# Patient Record
Sex: Female | Born: 1955 | Race: White | Hispanic: No | Marital: Married | Smoking: Never smoker
Health system: Southern US, Community
[De-identification: ages and names within clinical notes are randomized; demographics above are authoritative.]

## PROBLEM LIST (undated history)

## (undated) DIAGNOSIS — Z8739 Personal history of other diseases of the musculoskeletal system and connective tissue: Secondary | ICD-10-CM

## (undated) DIAGNOSIS — Z9221 Personal history of antineoplastic chemotherapy: Secondary | ICD-10-CM

## (undated) DIAGNOSIS — R51 Headache: Secondary | ICD-10-CM

## (undated) DIAGNOSIS — R519 Headache, unspecified: Secondary | ICD-10-CM

## (undated) DIAGNOSIS — F329 Major depressive disorder, single episode, unspecified: Secondary | ICD-10-CM

## (undated) DIAGNOSIS — J45909 Unspecified asthma, uncomplicated: Secondary | ICD-10-CM

## (undated) DIAGNOSIS — I824Z9 Acute embolism and thrombosis of unspecified deep veins of unspecified distal lower extremity: Secondary | ICD-10-CM

## (undated) DIAGNOSIS — I313 Pericardial effusion (noninflammatory): Secondary | ICD-10-CM

## (undated) DIAGNOSIS — M1990 Unspecified osteoarthritis, unspecified site: Secondary | ICD-10-CM

## (undated) DIAGNOSIS — D649 Anemia, unspecified: Secondary | ICD-10-CM

## (undated) DIAGNOSIS — F32A Depression, unspecified: Secondary | ICD-10-CM

## (undated) DIAGNOSIS — E039 Hypothyroidism, unspecified: Secondary | ICD-10-CM

## (undated) DIAGNOSIS — F419 Anxiety disorder, unspecified: Secondary | ICD-10-CM

## (undated) DIAGNOSIS — K5790 Diverticulosis of intestine, part unspecified, without perforation or abscess without bleeding: Secondary | ICD-10-CM

## (undated) DIAGNOSIS — J90 Pleural effusion, not elsewhere classified: Secondary | ICD-10-CM

## (undated) DIAGNOSIS — M7072 Other bursitis of hip, left hip: Secondary | ICD-10-CM

## (undated) DIAGNOSIS — C50919 Malignant neoplasm of unspecified site of unspecified female breast: Secondary | ICD-10-CM

## (undated) DIAGNOSIS — E785 Hyperlipidemia, unspecified: Secondary | ICD-10-CM

## (undated) DIAGNOSIS — Z923 Personal history of irradiation: Secondary | ICD-10-CM

## (undated) HISTORY — PX: BREAST BIOPSY: SHX20

## (undated) HISTORY — PX: BREAST SURGERY: SHX581

## (undated) HISTORY — PX: TONSILLECTOMY: SUR1361

## (undated) HISTORY — DX: Pericardial effusion (noninflammatory): I31.3

## (undated) HISTORY — PX: ABDOMINAL HYSTERECTOMY: SHX81

---
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
## 2004-09-04 ENCOUNTER — Ambulatory Visit: Payer: Self-pay | Admitting: Surgery

## 2005-06-18 ENCOUNTER — Ambulatory Visit: Payer: Self-pay

## 2006-01-09 ENCOUNTER — Ambulatory Visit: Payer: Self-pay | Admitting: Internal Medicine

## 2006-08-19 ENCOUNTER — Ambulatory Visit: Payer: Self-pay | Admitting: Gastroenterology

## 2007-08-18 ENCOUNTER — Ambulatory Visit: Payer: Self-pay | Admitting: Internal Medicine

## 2007-08-20 ENCOUNTER — Ambulatory Visit: Payer: Self-pay | Admitting: Internal Medicine

## 2008-09-01 ENCOUNTER — Ambulatory Visit: Payer: Self-pay | Admitting: Internal Medicine

## 2009-09-09 ENCOUNTER — Ambulatory Visit: Payer: Self-pay | Admitting: Internal Medicine

## 2010-01-31 ENCOUNTER — Emergency Department: Payer: Self-pay | Admitting: Internal Medicine

## 2010-08-13 DIAGNOSIS — C50919 Malignant neoplasm of unspecified site of unspecified female breast: Secondary | ICD-10-CM

## 2010-08-13 HISTORY — PX: BREAST BIOPSY: SHX20

## 2010-08-13 HISTORY — DX: Malignant neoplasm of unspecified site of unspecified female breast: C50.919

## 2010-09-13 ENCOUNTER — Ambulatory Visit: Payer: Self-pay | Admitting: Internal Medicine

## 2010-09-25 ENCOUNTER — Ambulatory Visit: Payer: Self-pay | Admitting: Surgery

## 2010-09-27 ENCOUNTER — Ambulatory Visit: Payer: Self-pay | Admitting: Oncology

## 2010-10-03 ENCOUNTER — Ambulatory Visit: Payer: Self-pay | Admitting: Oncology

## 2010-10-03 LAB — PATHOLOGY REPORT

## 2010-10-12 ENCOUNTER — Ambulatory Visit: Payer: Self-pay | Admitting: Oncology

## 2010-10-16 ENCOUNTER — Ambulatory Visit: Payer: Self-pay | Admitting: Surgery

## 2010-11-12 ENCOUNTER — Ambulatory Visit: Payer: Self-pay | Admitting: Oncology

## 2010-12-12 ENCOUNTER — Ambulatory Visit: Payer: Self-pay | Admitting: Oncology

## 2011-01-12 ENCOUNTER — Ambulatory Visit: Payer: Self-pay | Admitting: Oncology

## 2011-02-11 ENCOUNTER — Ambulatory Visit: Payer: Self-pay | Admitting: Oncology

## 2011-02-19 ENCOUNTER — Other Ambulatory Visit: Payer: Self-pay | Admitting: Surgery

## 2011-02-27 ENCOUNTER — Inpatient Hospital Stay: Payer: Self-pay | Admitting: Surgery

## 2011-02-27 HISTORY — PX: MASTECTOMY: SHX3

## 2011-03-14 ENCOUNTER — Ambulatory Visit: Payer: Self-pay | Admitting: Oncology

## 2011-04-14 ENCOUNTER — Ambulatory Visit: Payer: Self-pay | Admitting: Oncology

## 2011-05-14 ENCOUNTER — Ambulatory Visit: Payer: Self-pay | Admitting: Oncology

## 2011-06-14 ENCOUNTER — Ambulatory Visit: Payer: Self-pay | Admitting: Oncology

## 2011-07-02 ENCOUNTER — Ambulatory Visit: Payer: Self-pay | Admitting: Anesthesiology

## 2011-07-03 ENCOUNTER — Ambulatory Visit: Payer: Self-pay

## 2011-07-14 ENCOUNTER — Ambulatory Visit: Payer: Self-pay | Admitting: Oncology

## 2011-07-14 HISTORY — PX: PLACEMENT OF BREAST IMPLANTS: SHX6334

## 2011-08-08 ENCOUNTER — Ambulatory Visit: Payer: Self-pay

## 2011-08-14 ENCOUNTER — Ambulatory Visit: Payer: Self-pay | Admitting: Oncology

## 2011-09-14 ENCOUNTER — Ambulatory Visit: Payer: Self-pay | Admitting: Oncology

## 2011-10-12 ENCOUNTER — Ambulatory Visit: Payer: Self-pay | Admitting: Oncology

## 2011-10-19 ENCOUNTER — Other Ambulatory Visit: Payer: Self-pay | Admitting: Oncology

## 2011-10-19 DIAGNOSIS — Z853 Personal history of malignant neoplasm of breast: Secondary | ICD-10-CM

## 2011-10-19 DIAGNOSIS — Z9013 Acquired absence of bilateral breasts and nipples: Secondary | ICD-10-CM

## 2011-10-25 ENCOUNTER — Ambulatory Visit
Admission: RE | Admit: 2011-10-25 | Discharge: 2011-10-25 | Disposition: A | Discharge status: Home / Self Care | Payer: 59 | Source: Ambulatory Visit | Attending: Oncology | Admitting: Oncology

## 2011-10-25 DIAGNOSIS — Z853 Personal history of malignant neoplasm of breast: Secondary | ICD-10-CM

## 2011-10-25 DIAGNOSIS — Z9013 Acquired absence of bilateral breasts and nipples: Secondary | ICD-10-CM

## 2011-10-25 MED ORDER — GADOBENATE DIMEGLUMINE 529 MG/ML IV SOLN
18.0000 mL | Freq: Once | INTRAVENOUS | Status: AC | PRN
  Administered 2011-10-25: 18 mL via INTRAVENOUS

## 2011-11-12 ENCOUNTER — Ambulatory Visit: Payer: Self-pay | Admitting: Oncology

## 2011-12-12 ENCOUNTER — Ambulatory Visit: Payer: Self-pay | Admitting: Oncology

## 2011-12-28 ENCOUNTER — Ambulatory Visit: Payer: Self-pay | Admitting: Gastroenterology

## 2012-01-12 ENCOUNTER — Ambulatory Visit: Payer: Self-pay

## 2012-01-12 ENCOUNTER — Ambulatory Visit: Payer: Self-pay | Admitting: Oncology

## 2012-02-13 ENCOUNTER — Ambulatory Visit: Payer: Self-pay | Admitting: Oncology

## 2012-03-13 ENCOUNTER — Ambulatory Visit: Payer: Self-pay | Admitting: Oncology

## 2012-04-13 ENCOUNTER — Ambulatory Visit: Payer: Self-pay | Admitting: Oncology

## 2012-05-13 ENCOUNTER — Ambulatory Visit: Payer: Self-pay | Admitting: Oncology

## 2012-06-17 ENCOUNTER — Ambulatory Visit: Payer: Self-pay | Admitting: Oncology

## 2012-07-13 ENCOUNTER — Ambulatory Visit: Payer: Self-pay | Admitting: Oncology

## 2012-08-13 ENCOUNTER — Ambulatory Visit: Payer: Self-pay | Admitting: Oncology

## 2012-09-13 ENCOUNTER — Ambulatory Visit: Payer: Self-pay | Admitting: Oncology

## 2012-10-20 ENCOUNTER — Ambulatory Visit: Payer: Self-pay | Admitting: Oncology

## 2013-01-01 ENCOUNTER — Ambulatory Visit: Payer: Self-pay | Admitting: Oncology

## 2013-01-20 ENCOUNTER — Ambulatory Visit: Payer: Self-pay | Admitting: Oncology

## 2013-02-10 ENCOUNTER — Ambulatory Visit: Payer: Self-pay | Admitting: Oncology

## 2013-03-13 ENCOUNTER — Ambulatory Visit: Payer: Self-pay | Admitting: Oncology

## 2013-04-23 ENCOUNTER — Other Ambulatory Visit (HOSPITAL_COMMUNITY): Payer: Self-pay | Admitting: Oncology

## 2013-04-23 DIAGNOSIS — C50919 Malignant neoplasm of unspecified site of unspecified female breast: Secondary | ICD-10-CM

## 2013-04-23 DIAGNOSIS — Z9013 Acquired absence of bilateral breasts and nipples: Secondary | ICD-10-CM

## 2013-05-01 ENCOUNTER — Ambulatory Visit
Admission: RE | Admit: 2013-05-01 | Discharge: 2013-05-01 | Disposition: A | Discharge status: Home / Self Care | Payer: 59 | Source: Ambulatory Visit | Attending: Oncology | Admitting: Oncology

## 2013-05-01 DIAGNOSIS — C50919 Malignant neoplasm of unspecified site of unspecified female breast: Secondary | ICD-10-CM

## 2013-05-01 DIAGNOSIS — Z9013 Acquired absence of bilateral breasts and nipples: Secondary | ICD-10-CM

## 2013-05-01 MED ORDER — GADOBENATE DIMEGLUMINE 529 MG/ML IV SOLN
20.0000 mL | Freq: Once | INTRAVENOUS | Status: AC | PRN
  Administered 2013-05-01: 20 mL via INTRAVENOUS

## 2013-06-30 ENCOUNTER — Ambulatory Visit: Payer: Self-pay | Admitting: Unknown Physician Specialty

## 2013-07-23 ENCOUNTER — Ambulatory Visit: Payer: Self-pay | Admitting: Anesthesiology

## 2013-07-24 ENCOUNTER — Ambulatory Visit: Payer: Self-pay | Admitting: Oncology

## 2013-08-13 ENCOUNTER — Ambulatory Visit: Payer: Self-pay | Admitting: Oncology

## 2013-08-18 ENCOUNTER — Ambulatory Visit: Payer: Self-pay | Admitting: Anesthesiology

## 2014-01-29 ENCOUNTER — Ambulatory Visit: Payer: Self-pay | Admitting: Oncology

## 2014-02-10 ENCOUNTER — Ambulatory Visit: Payer: Self-pay | Admitting: Oncology

## 2014-03-17 ENCOUNTER — Ambulatory Visit: Payer: Self-pay | Admitting: Oncology

## 2014-03-30 ENCOUNTER — Other Ambulatory Visit: Payer: Self-pay | Admitting: Oncology

## 2014-03-30 DIAGNOSIS — Z853 Personal history of malignant neoplasm of breast: Secondary | ICD-10-CM

## 2014-05-03 ENCOUNTER — Ambulatory Visit
Admission: RE | Admit: 2014-05-03 | Discharge: 2014-05-03 | Disposition: A | Discharge status: Home / Self Care | Payer: 59 | Source: Ambulatory Visit | Attending: Oncology | Admitting: Oncology

## 2014-05-03 DIAGNOSIS — Z853 Personal history of malignant neoplasm of breast: Secondary | ICD-10-CM

## 2014-05-03 MED ORDER — GADOBENATE DIMEGLUMINE 529 MG/ML IV SOLN
20.0000 mL | Freq: Once | INTRAVENOUS | Status: AC | PRN
  Administered 2014-05-03: 20 mL via INTRAVENOUS

## 2014-07-29 ENCOUNTER — Ambulatory Visit: Payer: Self-pay | Admitting: Oncology

## 2014-08-13 ENCOUNTER — Ambulatory Visit: Payer: Self-pay | Admitting: Oncology

## 2014-12-03 NOTE — H&P (Signed)
PATIENT NAME:  Julie, Irwin MR#:  [id number] DATE OF BIRTH:  08-30-1955  DATE OF ADMISSION:  07/23/2013  CHIEF COMPLAINT: Low back pain and bilateral hip pain.   PROCEDURE: L4-L5 epidural steroid under fluoroscopic guidance with moderate sedation.   HISTORY OF PRESENT ILLNESS:  Julie Irwin presents for a new patient evaluation. She is a pleasant 59 year old white female with long-standing history of low back pain. This has basically been present for many years and has gradually gotten worse over the past several months to the point she sought medical attention. She has had previous hip injections for her hip pain, but the low back pain has been persistent, and Dr. Jefm Bryant has referred her secondary to the hip pain as a possible source of low back pain. She experiences low back pain that radiates into the left lateral hip and buttocks, as well as into the right side. It is worse on the left. She has had 3 previous bursa injections to the left hip and describes as a VAS of 9, best of 3, average is a 7; worse in the afternoon and worse after kneeling, sitting, standing, walking. Sitting at times helps, as does warm showers and baths. A previous MRI is reviewed today that shows, as dated 06/30/2013, evidence of mild bulges at L3-L4, L4-L5 and L5-S1. There is facet arthropathy, namely at L4-L5 and L5-S1, which is bilateral, and at L3-L4, she has symmetric lateral recess stenosis on the left with mild facet arthropathy and possible left L3-L4 nerve root impingement.   PAST MEDICAL HISTORY: Significant for asthma, scoliosis, anemia when treated for cancer, thyroid disease.   She is married with 1 child. Quit smoking 18 years ago and works as a Chiropractor at [employer] HISTORY: Significant for diabetes, high blood pressure and sudden stroke in her mother.   MEDICATIONS:  Compazine, fish oil, Ventolin, vitamin D3, Effexor 75, levothyroxine 125 , letrozole, topiramate, alendronate, aspirin 81  mg a day, Toprol, fluticasone, Flexeril 10 mg 1/2 tablet orally 3 times daily and Zofran.   PAST MEDICAL HISTORY: Also significant for hyperlipidemia, arthritis, fibrocystic disease, hypothyroidism, with history of breast cancer, right side; anxiety, depression, migraines, asthma.   Surgeries include anal sphincterotomy, tonsillectomy and adenoidectomy, a skin mass excision, right breast biopsy in the past and partial hysterectomy.   PHYSICAL EXAM:  GENERAL:  Reveals a pleasant white female in no acute distress. She is alert and oriented x 3, cooperative and compliant.  VITAL SIGNS: VAS is 6/10. Temperature 98.3 with a blood pressure 138/69, pulse is 78 and regular respirations.  CARDIOVASCULAR: Regular rate and rhythm.  EXTREMITIES:  Evaluation of lower extremity strength and function reveals a slightly antalgic gait. She goes from seated to standing without difficulty. She has a positive straight leg raise on the left side, negative on the right. Strength and muscle tone and bulk appears good.   ASSESSMENT: 1.  Degenerative disk disease, most notably at L3-L4 with L3 and L4 foraminal impingement on the left side consistent with symptoms.  2.  Facet arthropathy.  3.  Myofascial low back pain.   PLAN: 1.  We will proceed with an epidural steroid injection today, as requested per the patient. I have gone over the risks and benefits of the procedure with her in full detail. All questions are answered. No guarantees are made.  2.  Continue with stretching and strengthening exercises, as well as efforts at weight loss with return to clinic in 1 month.  PROCEDURE: The patient was taken to the fluoroscopy suite and placed in the prone position with IV in place and 2 mg of Versed were titrated for moderate sedation. Her back was prepped with Betadine x 3. We identified the area overlying the L4-L5 interspace; 1% lidocaine was infiltrated subQ into the fascia at this level. Using strict aseptic  technique and an intralaminar approach, I then advanced an 18-gauge Tuohy needle to a depth of approximately 8 cm, negative aspiration for heme or CSF. I injected 2 mL of Isovue yielding good epidural spread, approximately 2 levels cephalad, 1 level caudad, without evidence of IV or subarachnoid uptake. I then injected 5 mL normal saline, 1 mL ropivacaine 0.2% and 40 mg of triamcinolone without evidence of IV or subarachnoid symptoms. Then she was convalesced, discharged to home in stable condition. Follow up as mentioned.     ____________________________ Alvina Filbert Andree Elk, MD jga:dmm D: 07/23/2013 10:55:34 ET T: 07/23/2013 11:06:19 ET JOB#: [id number]  cc: Alvina Filbert. Andree Elk, MD, <Dictator> Kathrene Alu., MD Alvina Filbert Camey Edell MD ELECTRONICALLY SIGNED 07/27/2013 8:01

## 2015-01-04 DIAGNOSIS — M5416 Radiculopathy, lumbar region: Secondary | ICD-10-CM | POA: Insufficient documentation

## 2015-01-19 ENCOUNTER — Encounter: Payer: Self-pay | Admitting: Oncology

## 2015-01-26 ENCOUNTER — Inpatient Hospital Stay: Payer: 59 | Attending: Oncology | Admitting: Oncology

## 2015-01-26 ENCOUNTER — Encounter (INDEPENDENT_AMBULATORY_CARE_PROVIDER_SITE_OTHER): Payer: Self-pay

## 2015-01-26 VITALS — BP 113/67 | HR 73 | Temp 98.1°F | Resp 18 | Wt 226.2 lb

## 2015-01-26 DIAGNOSIS — Z7982 Long term (current) use of aspirin: Secondary | ICD-10-CM | POA: Insufficient documentation

## 2015-01-26 DIAGNOSIS — Z170 Estrogen receptor positive status [ER+]: Secondary | ICD-10-CM | POA: Diagnosis not present

## 2015-01-26 DIAGNOSIS — Z79899 Other long term (current) drug therapy: Secondary | ICD-10-CM | POA: Diagnosis not present

## 2015-01-26 DIAGNOSIS — C50911 Malignant neoplasm of unspecified site of right female breast: Secondary | ICD-10-CM | POA: Diagnosis not present

## 2015-01-26 DIAGNOSIS — Z79811 Long term (current) use of aromatase inhibitors: Secondary | ICD-10-CM | POA: Insufficient documentation

## 2015-01-26 DIAGNOSIS — M818 Other osteoporosis without current pathological fracture: Secondary | ICD-10-CM | POA: Diagnosis not present

## 2015-01-26 NOTE — Progress Notes (Signed)
Last week patient was having right breast pain.

## 2015-02-14 DIAGNOSIS — C50411 Malignant neoplasm of upper-outer quadrant of right female breast: Secondary | ICD-10-CM | POA: Insufficient documentation

## 2015-02-14 NOTE — Progress Notes (Signed)
[place]  [phone] Fax:(336) [phone]  ID: [name] OB: [birth year]  MR#: [id number]  PNT#:[id number]  Patient Care Team: Adin Hector, MD as PCP - General (Internal Medicine)  CHIEF COMPLAINT:  Chief Complaint  Patient presents with  . Follow-up    breast cancer    INTERVAL HISTORY: Patient returns to clinic today for routine six-month evaluation.  She currently feels well and is asymptomatic. She is tolerating letrozole and alendronate well without significant side effects. She has no neurologic complaints.  She has a good appetite and has maintained her weight.  She has no chest pain, shortness breath, cough, or hemoptysis.  She has no nausea, vomiting, constipation, or diarrhea.  Patient offers no specific complaints today.   REVIEW OF SYSTEMS:   Review of Systems  Constitutional: Negative.   Respiratory: Negative.   Cardiovascular: Negative.   Musculoskeletal: Negative.     As per HPI. Otherwise, a complete review of systems is negatve.  PAST MEDICAL HISTORY: No past medical history on file.  PAST SURGICAL HISTORY: No past surgical history on file.  FAMILY HISTORY No family history on file.     ADVANCED DIRECTIVES:    HEALTH MAINTENANCE: History  Substance Use Topics  . Smoking status: Not on file  . Smokeless tobacco: Not on file  . Alcohol Use: Not on file     Colonoscopy:  PAP:  Bone density:  Lipid panel:  Not on File  Current Outpatient Prescriptions  Medication Sig Dispense Refill  . alendronate (FOSAMAX) 70 MG tablet     . aspirin EC 81 MG tablet Take by mouth.    Marland Kitchen azelastine (ASTELIN) 0.1 % nasal spray instill 1 spray into each nostril twice a day for 7 days then once daily if needed  0  . benzonatate (TESSALON) 200 MG capsule take 1 capsule by mouth three times a day if needed for cough  0  . Cholecalciferol (VITAMIN D3) 2000 UNITS capsule Take by mouth.    . cyclobenzaprine (FLEXERIL) 10 MG  tablet Take by mouth.    . DULoxetine (CYMBALTA) 60 MG capsule     . letrozole (FEMARA) 2.5 MG tablet     . levothyroxine (SYNTHROID, LEVOTHROID) 137 MCG tablet     . metoprolol succinate (TOPROL-XL) 25 MG 24 hr tablet     . Omega-3 Fatty Acids (FISH OIL CONCENTRATE) 300 MG CAPS Take by mouth.    . prochlorperazine (COMPAZINE) 10 MG tablet     . QVAR 40 MCG/ACT inhaler     . topiramate (TOPAMAX) 50 MG tablet      No current facility-administered medications for this visit.    OBJECTIVE: Filed Vitals:   01/26/15 1617  BP: 113/67  Pulse: 73  Temp: 98.1 F (36.7 C)  Resp: 18     There is no height on file to calculate BMI.    ECOG FS:0 - Asymptomatic  General: Well-developed, well-nourished, no acute distress. Eyes: anicteric sclera. Breasts: Patient has bilateral reconstruction, no evidence of recurrence. Lungs: Clear to auscultation bilaterally. Heart: Regular rate and rhythm. No rubs, murmurs, or gallops. Abdomen: Soft, nontender, nondistended. No organomegaly noted, normoactive bowel sounds. Musculoskeletal: No edema, cyanosis, or clubbing. Neuro: Alert, answering all questions appropriately. Cranial nerves grossly intact. Skin: No rashes or petechiae noted. Psych: Normal affect.   LAB RESULTS:  No results found for: NA, K, CL, CO2, GLUCOSE, BUN, CREATININE, CALCIUM, PROT, ALBUMIN, AST, ALT, ALKPHOS, BILITOT, GFRNONAA, GFRAA  No results found for:  WBC, NEUTROABS, HGB, HCT, MCV, PLT   STUDIES: No results found.  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   PLAN:    1.  Breast cancer: No evidence of disease.  Patient's CA-27-29 is 21.7.  Patient had a recent breast MRI that was reported as negative.  Her post treatment MUGA revealed an EF of 61%. Continue letrozole completing in August 2017.  Return to clinic in 6 months for routine evaluation.  2.  Osteoporosis: Patient's most recent T score in August  2015 was reported as -2.6 which is unchanged from previous.  Continue alendronate, calcium, and vitamin D.  Repeat in August 2016.   Patient expressed understanding and was in agreement with this plan. She also understands that She can call clinic at any time with any questions, concerns, or complaints.   Breast cancer   Staging form: Breast, AJCC 7th Edition     Clinical stage from 02/14/2015: Stage IA (T1c, N0, M0) - Signed by Lloyd Huger, MD on 02/14/2015   Lloyd Huger, MD   02/14/2015 9:59 AM

## 2015-03-15 ENCOUNTER — Other Ambulatory Visit: Payer: Self-pay | Admitting: Oncology

## 2015-04-03 ENCOUNTER — Other Ambulatory Visit: Payer: Self-pay | Admitting: Oncology

## 2015-07-20 ENCOUNTER — Encounter: Payer: Self-pay | Admitting: Oncology

## 2015-08-03 ENCOUNTER — Inpatient Hospital Stay: Payer: 59 | Attending: Oncology | Admitting: Oncology

## 2015-08-03 VITALS — BP 116/84 | HR 76 | Temp 98.3°F | Resp 16 | Wt 224.6 lb

## 2015-08-03 DIAGNOSIS — Z8669 Personal history of other diseases of the nervous system and sense organs: Secondary | ICD-10-CM | POA: Insufficient documentation

## 2015-08-03 DIAGNOSIS — Z801 Family history of malignant neoplasm of trachea, bronchus and lung: Secondary | ICD-10-CM | POA: Diagnosis not present

## 2015-08-03 DIAGNOSIS — Z79811 Long term (current) use of aromatase inhibitors: Secondary | ICD-10-CM | POA: Diagnosis not present

## 2015-08-03 DIAGNOSIS — Z9013 Acquired absence of bilateral breasts and nipples: Secondary | ICD-10-CM | POA: Diagnosis not present

## 2015-08-03 DIAGNOSIS — M818 Other osteoporosis without current pathological fracture: Secondary | ICD-10-CM | POA: Diagnosis not present

## 2015-08-03 DIAGNOSIS — J45909 Unspecified asthma, uncomplicated: Secondary | ICD-10-CM | POA: Insufficient documentation

## 2015-08-03 DIAGNOSIS — Z7982 Long term (current) use of aspirin: Secondary | ICD-10-CM | POA: Diagnosis not present

## 2015-08-03 DIAGNOSIS — C50911 Malignant neoplasm of unspecified site of right female breast: Secondary | ICD-10-CM | POA: Insufficient documentation

## 2015-08-03 DIAGNOSIS — Z800 Family history of malignant neoplasm of digestive organs: Secondary | ICD-10-CM | POA: Diagnosis not present

## 2015-08-03 DIAGNOSIS — Z79899 Other long term (current) drug therapy: Secondary | ICD-10-CM | POA: Diagnosis not present

## 2015-08-03 DIAGNOSIS — E039 Hypothyroidism, unspecified: Secondary | ICD-10-CM

## 2015-08-03 DIAGNOSIS — C50919 Malignant neoplasm of unspecified site of unspecified female breast: Secondary | ICD-10-CM

## 2015-08-03 DIAGNOSIS — Z170 Estrogen receptor positive status [ER+]: Secondary | ICD-10-CM

## 2015-08-03 DIAGNOSIS — F329 Major depressive disorder, single episode, unspecified: Secondary | ICD-10-CM | POA: Insufficient documentation

## 2015-08-03 NOTE — Progress Notes (Signed)
2 weeks ago patient was having left side chest wall pain that is now improved.

## 2015-08-14 NOTE — Progress Notes (Signed)
[place]  Telephone:(336) [phone] Fax:(336) [phone]  ID: Julie Irwin OB: January 21, 1956  MR#: [id number]  OJJ#:[id number]  Patient Care Team: Adin Hector, MD as PCP - General (Internal Medicine)  CHIEF COMPLAINT:  Chief Complaint  Patient presents with  . Breast Cancer    INTERVAL HISTORY: Patient returns to clinic today for routine six-month evaluation. She reports left sided chest wall pain approximately 2 weeks ago that resolved without intervention. She otherwise has felt well and is asymptomatic. She is tolerating letrozole and alendronate well without significant side effects. She has no neurologic complaints. She has a good appetite and has maintained her weight.  She has no shortness breath, cough, or hemoptysis.  She has no nausea, vomiting, constipation, or diarrhea.  Patient offers no further specific complaints today.   REVIEW OF SYSTEMS:   Review of Systems  Constitutional: Negative.  Negative for fever and malaise/fatigue.  Respiratory: Negative.  Negative for cough and shortness of breath.   Cardiovascular: Negative.  Negative for chest pain.  Gastrointestinal: Negative.   Musculoskeletal: Negative.   Neurological: Negative.  Negative for weakness.    As per HPI. Otherwise, a complete review of systems is negatve.  PAST MEDICAL HISTORY:  Hypothyroidism, migraines, depression, asthma.  PAST SURGICAL HISTORY: Bilateral mastectomy with reconstruction, partial hysterectomy.  FAMILY HISTORY: Lung cancer, melanoma, stomach cancer.  Also diabetes, CAD, hypertension     ADVANCED DIRECTIVES:    HEALTH MAINTENANCE: Social History  Substance Use Topics  . Smoking status: Not on file  . Smokeless tobacco: Not on file  . Alcohol Use: Not on file     Colonoscopy:  PAP:  Bone density:  Lipid panel:  Allergies  Allergen Reactions  . Codeine Other (See Comments)    constipation  . Sulfa Antibiotics Other (See Comments)    Allergy as a  child    Current Outpatient Prescriptions  Medication Sig Dispense Refill  . alendronate (FOSAMAX) 70 MG tablet Take 1 tablet by mouth  every week 12 tablet 2  . aspirin EC 81 MG tablet Take by mouth.    Marland Kitchen azelastine (ASTELIN) 0.1 % nasal spray instill 1 spray into each nostril twice a day for 7 days then once daily if needed  0  . Cholecalciferol (VITAMIN D3) 2000 UNITS capsule Take by mouth.    . cyclobenzaprine (FLEXERIL) 10 MG tablet Take by mouth.    . DULoxetine (CYMBALTA) 60 MG capsule     . letrozole (FEMARA) 2.5 MG tablet Take 1 tablet by mouth once a day 90 tablet 3  . metoprolol succinate (TOPROL-XL) 25 MG 24 hr tablet     . Omega-3 Fatty Acids (FISH OIL CONCENTRATE) 300 MG CAPS Take by mouth.    . prochlorperazine (COMPAZINE) 10 MG tablet     . QVAR 40 MCG/ACT inhaler     . topiramate (TOPAMAX) 50 MG tablet     . benzonatate (TESSALON) 200 MG capsule Reported on 08/03/2015  0  . levothyroxine (SYNTHROID, LEVOTHROID) 150 MCG tablet      No current facility-administered medications for this visit.    OBJECTIVE: Filed Vitals:   08/03/15 1504  BP: 116/84  Pulse: 76  Temp: 98.3 F (36.8 C)  Resp: 16     There is no height on file to calculate BMI.    ECOG FS:0 - Asymptomatic  General: Well-developed, well-nourished, no acute distress. Eyes: anicteric sclera. Breasts: Patient has bilateral reconstruction, no evidence of recurrence. Lungs: Clear to auscultation bilaterally.  Heart: Regular rate and rhythm. No rubs, murmurs, or gallops. Abdomen: Soft, nontender, nondistended. No organomegaly noted, normoactive bowel sounds. Musculoskeletal: No edema, cyanosis, or clubbing. Neuro: Alert, answering all questions appropriately. Cranial nerves grossly intact. Skin: No rashes or petechiae noted. Psych: Normal affect.   LAB RESULTS:  No results found for: NA, K, CL, CO2, GLUCOSE, BUN, CREATININE, CALCIUM, PROT, ALBUMIN, AST, ALT, ALKPHOS, BILITOT, GFRNONAA, GFRAA  No  results found for: WBC, NEUTROABS, HGB, HCT, MCV, PLT   STUDIES: No results found.  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   PLAN:    1.  Breast cancer: No evidence of disease.  Patient's CA-27-29 continues to be within normal limits.  Patient had a recent breast MRI that was reported as negative.  Her post treatment MUGA revealed an EF of 61%. Continue letrozole completing in August 2017.  Return to clinic in August 2017 at which point she can discontinue letrozole and be switched to yearly visits. 2.  Osteoporosis: Patient's most recent T score in August 2015 was reported as -2.6 which is unchanged from previous.  Continue alendronate, calcium, and vitamin D.  Repeat in the next 1-2 weeks.   Patient expressed understanding and was in agreement with this plan. She also understands that She can call clinic at any time with any questions, concerns, or complaints.   Breast cancer   Staging form: Breast, AJCC 7th Edition     Clinical stage from 02/14/2015: Stage IA (T1c, N0, M0) - Signed by Lloyd Huger, MD on 02/14/2015   Lloyd Huger, MD   08/14/2015 9:58 AM

## 2015-08-23 ENCOUNTER — Ambulatory Visit: Payer: 59

## 2015-08-30 ENCOUNTER — Ambulatory Visit
Admission: RE | Admit: 2015-08-30 | Discharge: 2015-08-30 | Disposition: A | Discharge status: Home / Self Care | Payer: 59 | Source: Ambulatory Visit | Attending: Oncology | Admitting: Oncology

## 2015-08-30 DIAGNOSIS — C50919 Malignant neoplasm of unspecified site of unspecified female breast: Secondary | ICD-10-CM | POA: Insufficient documentation

## 2015-08-30 DIAGNOSIS — M8580 Other specified disorders of bone density and structure, unspecified site: Secondary | ICD-10-CM | POA: Insufficient documentation

## 2015-08-30 DIAGNOSIS — Z13820 Encounter for screening for osteoporosis: Secondary | ICD-10-CM | POA: Diagnosis present

## 2015-08-30 HISTORY — DX: Malignant neoplasm of unspecified site of unspecified female breast: C50.919

## 2015-11-22 DIAGNOSIS — Z6841 Body Mass Index (BMI) 40.0 and over, adult: Secondary | ICD-10-CM

## 2016-01-01 ENCOUNTER — Other Ambulatory Visit: Payer: Self-pay | Admitting: Oncology

## 2016-01-05 ENCOUNTER — Other Ambulatory Visit: Payer: Self-pay | Admitting: Oncology

## 2016-03-16 ENCOUNTER — Encounter: Payer: Self-pay | Admitting: Oncology

## 2016-04-01 ENCOUNTER — Encounter: Payer: Self-pay | Admitting: Oncology

## 2016-04-02 ENCOUNTER — Inpatient Hospital Stay: Payer: 59 | Attending: Oncology | Admitting: Oncology

## 2016-04-02 VITALS — BP 124/83 | HR 74 | Temp 97.4°F | Resp 18 | Wt 220.2 lb

## 2016-04-02 DIAGNOSIS — Z800 Family history of malignant neoplasm of digestive organs: Secondary | ICD-10-CM

## 2016-04-02 DIAGNOSIS — C50411 Malignant neoplasm of upper-outer quadrant of right female breast: Secondary | ICD-10-CM | POA: Insufficient documentation

## 2016-04-02 DIAGNOSIS — E039 Hypothyroidism, unspecified: Secondary | ICD-10-CM | POA: Diagnosis not present

## 2016-04-02 DIAGNOSIS — M818 Other osteoporosis without current pathological fracture: Secondary | ICD-10-CM

## 2016-04-02 DIAGNOSIS — Z808 Family history of malignant neoplasm of other organs or systems: Secondary | ICD-10-CM | POA: Diagnosis not present

## 2016-04-02 DIAGNOSIS — F329 Major depressive disorder, single episode, unspecified: Secondary | ICD-10-CM | POA: Diagnosis not present

## 2016-04-02 DIAGNOSIS — Z7982 Long term (current) use of aspirin: Secondary | ICD-10-CM | POA: Insufficient documentation

## 2016-04-02 DIAGNOSIS — Z79899 Other long term (current) drug therapy: Secondary | ICD-10-CM | POA: Insufficient documentation

## 2016-04-02 DIAGNOSIS — Z79811 Long term (current) use of aromatase inhibitors: Secondary | ICD-10-CM | POA: Insufficient documentation

## 2016-04-02 DIAGNOSIS — Z9013 Acquired absence of bilateral breasts and nipples: Secondary | ICD-10-CM | POA: Insufficient documentation

## 2016-04-02 DIAGNOSIS — Z170 Estrogen receptor positive status [ER+]: Secondary | ICD-10-CM | POA: Diagnosis not present

## 2016-04-02 DIAGNOSIS — Z8669 Personal history of other diseases of the nervous system and sense organs: Secondary | ICD-10-CM | POA: Diagnosis not present

## 2016-04-02 DIAGNOSIS — Z801 Family history of malignant neoplasm of trachea, bronchus and lung: Secondary | ICD-10-CM | POA: Diagnosis not present

## 2016-04-02 DIAGNOSIS — J45909 Unspecified asthma, uncomplicated: Secondary | ICD-10-CM | POA: Diagnosis not present

## 2016-04-02 NOTE — Progress Notes (Signed)
Patient would like to discuss the possibility of getting Prolia injections instead of taking Fosamax.  Would also like rx for bra prosthesis.  She would like to decline a breast exam today.

## 2016-04-02 NOTE — Progress Notes (Signed)
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  INTERVAL HISTORY: Patient returns to clinic today for routine six-month evaluation. She currently feels well and is asymptomatic. She is tolerating letrozole and alendronate well without significant side effects. She has no neurologic complaints. She has a good appetite and has maintained her weight.  She has no shortness breath, cough, or hemoptysis.  She has no nausea, vomiting, constipation, or diarrhea.  Patient offers no specific complaints today.  REVIEW OF SYSTEMS:   Review of Systems  Constitutional: Negative.  Negative for fever and malaise/fatigue.  Respiratory: Negative.  Negative for cough and shortness of breath.   Cardiovascular: Negative.  Negative for chest pain.  Gastrointestinal: Negative.   Musculoskeletal: Negative.   Neurological: Negative.  Negative for weakness.    As per HPI. Otherwise, a complete review of systems is negatve.  PAST MEDICAL HISTORY:  Hypothyroidism, migraines, depression, asthma.  PAST SURGICAL HISTORY: Bilateral mastectomy with reconstruction, partial hysterectomy.  FAMILY HISTORY: Lung cancer, melanoma, stomach cancer.  Also diabetes, CAD, hypertension     ADVANCED DIRECTIVES:    HEALTH MAINTENANCE: Social History  Substance Use Topics  . Smoking status: Not on file  . Smokeless tobacco: Not on file  . Alcohol use Not on file     Colonoscopy:  PAP:  Bone density:  Lipid panel:  Allergies  Allergen Reactions  . Codeine Other (See Comments)    constipation  . Sulfa Antibiotics Other (See Comments)    Allergy as a child    Current Outpatient Prescriptions  Medication  Sig Dispense Refill  . alendronate (FOSAMAX) 70 MG tablet Take 1 tablet by mouth  every week 12 tablet 3  . aspirin EC 81 MG tablet Take by mouth.    Marland Kitchen azelastine (ASTELIN) 0.1 % nasal spray instill 1 spray into each nostril twice a day for 7 days then once daily if needed  0  . benzonatate (TESSALON) 200 MG capsule Reported on 08/03/2015  0  . Cholecalciferol (VITAMIN D3) 2000 UNITS capsule Take by mouth.    . cyclobenzaprine (FLEXERIL) 10 MG tablet Take by mouth.    . DULoxetine (CYMBALTA) 60 MG capsule     . letrozole (FEMARA) 2.5 MG tablet Take 1 tablet by mouth once a day 90 tablet 3  . levothyroxine (SYNTHROID, LEVOTHROID) 150 MCG tablet     . metoprolol succinate (TOPROL-XL) 25 MG 24 hr tablet     . Omega-3 Fatty Acids (FISH OIL CONCENTRATE) 300 MG CAPS Take by mouth.    . prochlorperazine (COMPAZINE) 10 MG tablet     . QVAR 40 MCG/ACT inhaler     . topiramate (TOPAMAX) 50 MG tablet     . traMADol (ULTRAM) 50 MG tablet Take by mouth every 6 (six) hours as needed.     No current facility-administered medications for this visit.     OBJECTIVE: Vitals:   04/02/16 1452  BP: 124/83  Pulse: 74  Resp: 18  Temp: 97.4 F (36.3 C)     There is no height or weight on file to calculate BMI.    ECOG FS:0 - Asymptomatic  General: Well-developed, well-nourished, no acute distress. Eyes: anicteric sclera. Breasts: Patient has bilateral reconstruction, no evidence  of recurrence. Lungs: Clear to auscultation bilaterally. Heart: Regular rate and rhythm. No rubs, murmurs, or gallops. Abdomen: Soft, nontender, nondistended. No organomegaly noted, normoactive bowel sounds. Musculoskeletal: No edema, cyanosis, or clubbing. Neuro: Alert, answering all questions appropriately. Cranial nerves grossly intact. Skin: No rashes or petechiae noted. Psych: Normal affect.   LAB RESULTS:  No results found for: NA, K, CL, CO2, GLUCOSE, BUN, CREATININE, CALCIUM, PROT, ALBUMIN, AST, ALT, ALKPHOS,  BILITOT, GFRNONAA, GFRAA  No results found for: WBC, NEUTROABS, HGB, HCT, MCV, PLT   STUDIES: No results found.  ASSESSMENT: Stage Ia, ER positive, PR negative, HER-2 overexpressing adenocarcinoma of the upper outer quadrant of the right breast.   PLAN:    1. Stage Ia, ER positive, PR negative, HER-2 overexpressing adenocarcinoma of the upper outer quadrant of the right breast: No evidence of disease.  Patient's CA-27-29 continues to be within normal limits at 20.4.  Patient had a  breast MRI in September 2015 that was reported as negative.  Patient has now completed 5 years of letrozole and has been instructed to discontinue. No intervention is needed at this time. Return to clinic in 1 year for routine evaluation. 2. Osteoporosis: Patient's most recent T score on August 30, 2015 was significantly improved with a T score of -1.5. Previously her T score was reported at -2.6. Continue alendronate, calcium, and vitamin D.  Now the patient has discontinued letrozole, her bone mineral densities can now be ordered by her primary care physician.  Patient expressed understanding and was in agreement with this plan. She also understands that She can call clinic at any time with any questions, concerns, or complaints.   Breast cancer   Staging form: Breast, AJCC 7th Edition     Clinical stage from 02/14/2015: Stage IA (T1c, N0, M0) - Signed by Lloyd Huger, MD on 02/14/2015   Lloyd Huger, MD   04/02/2016 3:32 PM

## 2016-06-21 DIAGNOSIS — F330 Major depressive disorder, recurrent, mild: Secondary | ICD-10-CM | POA: Insufficient documentation

## 2016-08-13 HISTORY — PX: BREAST BIOPSY: SHX20

## 2016-09-21 DIAGNOSIS — Z4431 Encounter for fitting and adjustment of external right breast prosthesis: Secondary | ICD-10-CM | POA: Diagnosis not present

## 2016-09-21 DIAGNOSIS — C50111 Malignant neoplasm of central portion of right female breast: Secondary | ICD-10-CM | POA: Diagnosis not present

## 2016-10-03 DIAGNOSIS — M5136 Other intervertebral disc degeneration, lumbar region: Secondary | ICD-10-CM | POA: Diagnosis not present

## 2016-10-03 DIAGNOSIS — M7062 Trochanteric bursitis, left hip: Secondary | ICD-10-CM | POA: Diagnosis not present

## 2016-10-03 DIAGNOSIS — M5416 Radiculopathy, lumbar region: Secondary | ICD-10-CM | POA: Diagnosis not present

## 2016-10-30 DIAGNOSIS — E034 Atrophy of thyroid (acquired): Secondary | ICD-10-CM | POA: Diagnosis not present

## 2016-10-30 DIAGNOSIS — E784 Other hyperlipidemia: Secondary | ICD-10-CM | POA: Diagnosis not present

## 2016-10-30 DIAGNOSIS — R600 Localized edema: Secondary | ICD-10-CM | POA: Diagnosis not present

## 2016-11-22 DIAGNOSIS — M5136 Other intervertebral disc degeneration, lumbar region: Secondary | ICD-10-CM | POA: Diagnosis not present

## 2016-11-22 DIAGNOSIS — Z0000 Encounter for general adult medical examination without abnormal findings: Secondary | ICD-10-CM | POA: Diagnosis not present

## 2016-11-22 DIAGNOSIS — E034 Atrophy of thyroid (acquired): Secondary | ICD-10-CM | POA: Diagnosis not present

## 2016-12-15 ENCOUNTER — Other Ambulatory Visit: Payer: Self-pay | Admitting: Oncology

## 2016-12-19 DIAGNOSIS — E034 Atrophy of thyroid (acquired): Secondary | ICD-10-CM | POA: Diagnosis not present

## 2016-12-21 DIAGNOSIS — M5416 Radiculopathy, lumbar region: Secondary | ICD-10-CM | POA: Diagnosis not present

## 2016-12-21 DIAGNOSIS — M5136 Other intervertebral disc degeneration, lumbar region: Secondary | ICD-10-CM | POA: Diagnosis not present

## 2017-01-24 DIAGNOSIS — Z8371 Family history of colonic polyps: Secondary | ICD-10-CM | POA: Diagnosis not present

## 2017-01-24 DIAGNOSIS — Z1211 Encounter for screening for malignant neoplasm of colon: Secondary | ICD-10-CM | POA: Diagnosis not present

## 2017-03-13 DIAGNOSIS — J90 Pleural effusion, not elsewhere classified: Secondary | ICD-10-CM

## 2017-03-13 HISTORY — DX: Pleural effusion, not elsewhere classified: J90

## 2017-03-18 ENCOUNTER — Encounter: Payer: Self-pay | Admitting: Emergency Medicine

## 2017-03-18 ENCOUNTER — Inpatient Hospital Stay: Payer: 59

## 2017-03-18 ENCOUNTER — Inpatient Hospital Stay
Admission: EM | Admit: 2017-03-18 | Discharge: 2017-03-24 | DRG: 314 | Disposition: A | Discharge status: Home / Self Care | Payer: 59 | Attending: Internal Medicine | Admitting: Internal Medicine

## 2017-03-18 ENCOUNTER — Emergency Department: Payer: 59

## 2017-03-18 DIAGNOSIS — D649 Anemia, unspecified: Secondary | ICD-10-CM | POA: Diagnosis present

## 2017-03-18 DIAGNOSIS — J441 Chronic obstructive pulmonary disease with (acute) exacerbation: Secondary | ICD-10-CM | POA: Diagnosis present

## 2017-03-18 DIAGNOSIS — R748 Abnormal levels of other serum enzymes: Secondary | ICD-10-CM | POA: Diagnosis not present

## 2017-03-18 DIAGNOSIS — C380 Malignant neoplasm of heart: Secondary | ICD-10-CM | POA: Diagnosis not present

## 2017-03-18 DIAGNOSIS — J189 Pneumonia, unspecified organism: Secondary | ICD-10-CM | POA: Diagnosis not present

## 2017-03-18 DIAGNOSIS — E872 Acidosis, unspecified: Secondary | ICD-10-CM

## 2017-03-18 DIAGNOSIS — Z8371 Family history of colonic polyps: Secondary | ICD-10-CM

## 2017-03-18 DIAGNOSIS — E876 Hypokalemia: Secondary | ICD-10-CM | POA: Diagnosis present

## 2017-03-18 DIAGNOSIS — R0602 Shortness of breath: Secondary | ICD-10-CM

## 2017-03-18 DIAGNOSIS — M25552 Pain in left hip: Secondary | ICD-10-CM | POA: Diagnosis present

## 2017-03-18 DIAGNOSIS — R000 Tachycardia, unspecified: Secondary | ICD-10-CM | POA: Diagnosis not present

## 2017-03-18 DIAGNOSIS — Z6841 Body Mass Index (BMI) 40.0 and over, adult: Secondary | ICD-10-CM | POA: Diagnosis not present

## 2017-03-18 DIAGNOSIS — I313 Pericardial effusion (noninflammatory): Secondary | ICD-10-CM | POA: Diagnosis not present

## 2017-03-18 DIAGNOSIS — R079 Chest pain, unspecified: Secondary | ICD-10-CM | POA: Diagnosis not present

## 2017-03-18 DIAGNOSIS — Z853 Personal history of malignant neoplasm of breast: Secondary | ICD-10-CM | POA: Diagnosis not present

## 2017-03-18 DIAGNOSIS — I248 Other forms of acute ischemic heart disease: Secondary | ICD-10-CM | POA: Diagnosis present

## 2017-03-18 DIAGNOSIS — E785 Hyperlipidemia, unspecified: Secondary | ICD-10-CM | POA: Diagnosis present

## 2017-03-18 DIAGNOSIS — M25551 Pain in right hip: Secondary | ICD-10-CM | POA: Diagnosis present

## 2017-03-18 DIAGNOSIS — Z79899 Other long term (current) drug therapy: Secondary | ICD-10-CM

## 2017-03-18 DIAGNOSIS — Z9013 Acquired absence of bilateral breasts and nipples: Secondary | ICD-10-CM

## 2017-03-18 DIAGNOSIS — I314 Cardiac tamponade: Secondary | ICD-10-CM | POA: Diagnosis not present

## 2017-03-18 DIAGNOSIS — M545 Low back pain: Secondary | ICD-10-CM | POA: Diagnosis present

## 2017-03-18 DIAGNOSIS — J90 Pleural effusion, not elsewhere classified: Secondary | ICD-10-CM | POA: Diagnosis not present

## 2017-03-18 DIAGNOSIS — E039 Hypothyroidism, unspecified: Secondary | ICD-10-CM | POA: Diagnosis present

## 2017-03-18 DIAGNOSIS — I319 Disease of pericardium, unspecified: Secondary | ICD-10-CM | POA: Diagnosis not present

## 2017-03-18 DIAGNOSIS — Z7983 Long term (current) use of bisphosphonates: Secondary | ICD-10-CM | POA: Diagnosis not present

## 2017-03-18 DIAGNOSIS — I959 Hypotension, unspecified: Secondary | ICD-10-CM | POA: Diagnosis present

## 2017-03-18 DIAGNOSIS — F329 Major depressive disorder, single episode, unspecified: Secondary | ICD-10-CM | POA: Diagnosis present

## 2017-03-18 DIAGNOSIS — I471 Supraventricular tachycardia, unspecified: Secondary | ICD-10-CM

## 2017-03-18 DIAGNOSIS — Z7982 Long term (current) use of aspirin: Secondary | ICD-10-CM

## 2017-03-18 DIAGNOSIS — R0600 Dyspnea, unspecified: Secondary | ICD-10-CM

## 2017-03-18 DIAGNOSIS — J9601 Acute respiratory failure with hypoxia: Secondary | ICD-10-CM | POA: Diagnosis present

## 2017-03-18 DIAGNOSIS — D72829 Elevated white blood cell count, unspecified: Secondary | ICD-10-CM

## 2017-03-18 DIAGNOSIS — R51 Headache: Secondary | ICD-10-CM | POA: Diagnosis not present

## 2017-03-18 DIAGNOSIS — I3139 Other pericardial effusion (noninflammatory): Secondary | ICD-10-CM | POA: Diagnosis present

## 2017-03-18 DIAGNOSIS — R0789 Other chest pain: Secondary | ICD-10-CM | POA: Diagnosis not present

## 2017-03-18 DIAGNOSIS — M109 Gout, unspecified: Secondary | ICD-10-CM | POA: Diagnosis present

## 2017-03-18 DIAGNOSIS — J181 Lobar pneumonia, unspecified organism: Secondary | ICD-10-CM

## 2017-03-18 HISTORY — DX: Morbid (severe) obesity due to excess calories: E66.01

## 2017-03-18 HISTORY — DX: Hypothyroidism, unspecified: E03.9

## 2017-03-18 HISTORY — DX: Hyperlipidemia, unspecified: E78.5

## 2017-03-18 HISTORY — DX: Personal history of other diseases of the musculoskeletal system and connective tissue: Z87.39

## 2017-03-18 LAB — CBC WITH DIFFERENTIAL/PLATELET
Basophils Absolute: 0 10*3/uL (ref 0–0.1)
Basophils Relative: 0 %
Eosinophils Absolute: 0 10*3/uL (ref 0–0.7)
Eosinophils Relative: 0 %
HCT: 35.2 % (ref 35.0–47.0)
HEMOGLOBIN: 11.9 g/dL — AB (ref 12.0–16.0)
LYMPHS ABS: 1.2 10*3/uL (ref 1.0–3.6)
LYMPHS PCT: 10 %
MCH: 31.7 pg (ref 26.0–34.0)
MCHC: 33.9 g/dL (ref 32.0–36.0)
MCV: 93.5 fL (ref 80.0–100.0)
Monocytes Absolute: 1 10*3/uL — ABNORMAL HIGH (ref 0.2–0.9)
Monocytes Relative: 9 %
NEUTROS ABS: 9.3 10*3/uL — AB (ref 1.4–6.5)
NEUTROS PCT: 81 %
Platelets: 219 10*3/uL (ref 150–440)
RBC: 3.77 MIL/uL — AB (ref 3.80–5.20)
RDW: 13 % (ref 11.5–14.5)
WBC: 11.5 10*3/uL — AB (ref 3.6–11.0)

## 2017-03-18 LAB — FIBRIN DERIVATIVES D-DIMER (ARMC ONLY): Fibrin derivatives D-dimer (ARMC): 6822.19 — ABNORMAL HIGH (ref 0.00–499.00)

## 2017-03-18 LAB — COMPREHENSIVE METABOLIC PANEL
ALBUMIN: 3.9 g/dL (ref 3.5–5.0)
ALK PHOS: 72 U/L (ref 38–126)
ALT: 60 U/L — AB (ref 14–54)
ANION GAP: 11 (ref 5–15)
AST: 46 U/L — ABNORMAL HIGH (ref 15–41)
BUN: 18 mg/dL (ref 6–20)
CALCIUM: 9.2 mg/dL (ref 8.9–10.3)
CHLORIDE: 104 mmol/L (ref 101–111)
CO2: 21 mmol/L — AB (ref 22–32)
Creatinine, Ser: 0.96 mg/dL (ref 0.44–1.00)
GFR calc non Af Amer: >60 mL/min (ref >60)
GLUCOSE: 148 mg/dL — AB (ref 65–99)
Potassium: 3.8 mmol/L (ref 3.5–5.1)
SODIUM: 136 mmol/L (ref 135–145)
TOTAL PROTEIN: 7.3 g/dL (ref 6.5–8.1)
Total Bilirubin: 1.2 mg/dL (ref 0.3–1.2)

## 2017-03-18 LAB — APTT: aPTT: 31 seconds (ref 24–36)

## 2017-03-18 LAB — GLUCOSE, CAPILLARY: GLUCOSE-CAPILLARY: 113 mg/dL — AB (ref 65–99)

## 2017-03-18 LAB — LACTIC ACID, PLASMA
Lactic Acid, Venous: 2.2 mmol/L (ref 0.5–1.9)
Lactic Acid, Venous: 2.1 mmol/L (ref 0.5–1.9)

## 2017-03-18 LAB — TSH: TSH: 1.98 u[IU]/mL (ref 0.350–4.500)

## 2017-03-18 LAB — LIPASE, BLOOD: LIPASE: 20 U/L (ref 11–51)

## 2017-03-18 LAB — TROPONIN I
Troponin I: <0.03 ng/mL (ref <0.03)
Troponin I: 0.07 ng/mL (ref <0.03)

## 2017-03-18 LAB — PROTIME-INR
INR: 1.08
Prothrombin Time: 14 seconds (ref 11.4–15.2)

## 2017-03-18 LAB — BRAIN NATRIURETIC PEPTIDE: B Natriuretic Peptide: 114 pg/mL — ABNORMAL HIGH (ref 0.0–100.0)

## 2017-03-18 MED ORDER — SODIUM CHLORIDE 0.9 % IV BOLUS (SEPSIS)
500.0000 mL | Freq: Once | INTRAVENOUS | Status: AC
  Administered 2017-03-18: 500 mL via INTRAVENOUS

## 2017-03-18 MED ORDER — DOCUSATE SODIUM 100 MG PO CAPS
100.0000 mg | ORAL_CAPSULE | Freq: Two times a day (BID) | ORAL | Status: DC
  Administered 2017-03-19 – 2017-03-21 (×7): 100 mg via ORAL
  Filled 2017-03-18 (×11): qty 1

## 2017-03-18 MED ORDER — VERAPAMIL HCL 2.5 MG/ML IV SOLN
2.5000 mg | Freq: Once | INTRAVENOUS | Status: DC
  Filled 2017-03-18: qty 1

## 2017-03-18 MED ORDER — TRAMADOL HCL 50 MG PO TABS
25.0000 mg | ORAL_TABLET | Freq: Four times a day (QID) | ORAL | Status: DC | PRN
  Administered 2017-03-19 – 2017-03-20 (×3): 50 mg via ORAL
  Filled 2017-03-18 (×3): qty 1

## 2017-03-18 MED ORDER — BUDESONIDE 0.25 MG/2ML IN SUSP
0.2500 mg | Freq: Two times a day (BID) | RESPIRATORY_TRACT | Status: DC
  Administered 2017-03-19 – 2017-03-21 (×5): 0.25 mg via RESPIRATORY_TRACT
  Filled 2017-03-18 (×5): qty 2

## 2017-03-18 MED ORDER — SODIUM CHLORIDE 0.9% FLUSH: 3.0000 mL | INTRAVENOUS | Status: DC | PRN

## 2017-03-18 MED ORDER — ENOXAPARIN SODIUM 40 MG/0.4ML ~~LOC~~ SOLN: 40.0000 mg | Freq: Two times a day (BID) | SUBCUTANEOUS | Status: DC

## 2017-03-18 MED ORDER — GUAIFENESIN ER 600 MG PO TB12
600.0000 mg | ORAL_TABLET | Freq: Two times a day (BID) | ORAL | Status: DC
  Administered 2017-03-19 – 2017-03-21 (×7): 600 mg via ORAL
  Filled 2017-03-18 (×8): qty 1

## 2017-03-18 MED ORDER — DEXTROSE 5 % IV SOLN
1.0000 g | Freq: Once | INTRAVENOUS | Status: AC
  Administered 2017-03-18: 1 g via INTRAVENOUS
  Filled 2017-03-18: qty 10

## 2017-03-18 MED ORDER — TIOTROPIUM BROMIDE MONOHYDRATE 18 MCG IN CAPS
18.0000 ug | ORAL_CAPSULE | Freq: Every day | RESPIRATORY_TRACT | Status: DC
  Administered 2017-03-19 – 2017-03-21 (×3): 18 ug via RESPIRATORY_TRACT
  Filled 2017-03-18: qty 5

## 2017-03-18 MED ORDER — SODIUM CHLORIDE 0.9 % IV SOLN: 250.0000 mL | INTRAVENOUS | Status: DC | PRN

## 2017-03-18 MED ORDER — ONDANSETRON HCL 4 MG PO TABS: 4.0000 mg | ORAL_TABLET | Freq: Four times a day (QID) | ORAL | Status: DC | PRN

## 2017-03-18 MED ORDER — IOPAMIDOL (ISOVUE-370) INJECTION 76%
75.0000 mL | Freq: Once | INTRAVENOUS | Status: AC | PRN
  Administered 2017-03-18: 75 mL via INTRAVENOUS

## 2017-03-18 MED ORDER — SODIUM CHLORIDE 0.9% FLUSH
3.0000 mL | Freq: Two times a day (BID) | INTRAVENOUS | Status: DC
  Administered 2017-03-19 (×2): 3 mL via INTRAVENOUS

## 2017-03-18 MED ORDER — LEVALBUTEROL HCL 0.63 MG/3ML IN NEBU
0.6300 mg | INHALATION_SOLUTION | Freq: Four times a day (QID) | RESPIRATORY_TRACT | Status: DC
  Administered 2017-03-19 – 2017-03-21 (×10): 0.63 mg via RESPIRATORY_TRACT
  Filled 2017-03-18 (×10): qty 3

## 2017-03-18 MED ORDER — ONDANSETRON HCL 4 MG/2ML IJ SOLN: 4.0000 mg | Freq: Four times a day (QID) | INTRAMUSCULAR | Status: DC | PRN

## 2017-03-18 MED ORDER — DEXTROSE 5 % IV SOLN
250.0000 mg | INTRAVENOUS | Status: DC
  Administered 2017-03-19 – 2017-03-20 (×2): 250 mg via INTRAVENOUS
  Filled 2017-03-18 (×2): qty 250

## 2017-03-18 MED ORDER — DULOXETINE HCL 30 MG PO CPEP
90.0000 mg | ORAL_CAPSULE | Freq: Every day | ORAL | Status: DC
  Administered 2017-03-19 – 2017-03-24 (×6): 90 mg via ORAL
  Filled 2017-03-18 (×6): qty 3

## 2017-03-18 MED ORDER — DEXTROSE 5 % IV SOLN
500.0000 mg | Freq: Once | INTRAVENOUS | Status: AC
  Administered 2017-03-18: 500 mg via INTRAVENOUS
  Filled 2017-03-18: qty 500

## 2017-03-18 MED ORDER — SODIUM CHLORIDE 0.9 % IV BOLUS (SEPSIS)
1000.0000 mL | Freq: Once | INTRAVENOUS | Status: AC
  Administered 2017-03-18: 1000 mL via INTRAVENOUS

## 2017-03-18 MED ORDER — TOPIRAMATE 25 MG PO TABS
50.0000 mg | ORAL_TABLET | Freq: Every day | ORAL | Status: DC
  Administered 2017-03-19 – 2017-03-24 (×6): 50 mg via ORAL
  Filled 2017-03-18 (×6): qty 2

## 2017-03-18 MED ORDER — ACETAMINOPHEN 650 MG RE SUPP: 650.0000 mg | Freq: Four times a day (QID) | RECTAL | Status: DC | PRN

## 2017-03-18 MED ORDER — ACETAMINOPHEN 325 MG PO TABS
650.0000 mg | ORAL_TABLET | Freq: Four times a day (QID) | ORAL | Status: DC | PRN
  Administered 2017-03-19 – 2017-03-22 (×3): 650 mg via ORAL
  Filled 2017-03-18 (×3): qty 2

## 2017-03-18 MED ORDER — CYCLOBENZAPRINE HCL 10 MG PO TABS
10.0000 mg | ORAL_TABLET | Freq: Three times a day (TID) | ORAL | Status: DC | PRN
  Filled 2017-03-18: qty 1

## 2017-03-18 MED ORDER — CEFTRIAXONE SODIUM 1 G IJ SOLR
1.0000 g | INTRAMUSCULAR | Status: DC
  Administered 2017-03-19 – 2017-03-20 (×2): 1 g via INTRAVENOUS
  Filled 2017-03-18 (×2): qty 10

## 2017-03-18 MED ORDER — ASPIRIN EC 81 MG PO TBEC
81.0000 mg | DELAYED_RELEASE_TABLET | Freq: Every day | ORAL | Status: DC
  Administered 2017-03-19: 81 mg via ORAL
  Filled 2017-03-18: qty 1

## 2017-03-18 MED ORDER — LEVOTHYROXINE SODIUM 137 MCG PO TABS
137.0000 ug | ORAL_TABLET | Freq: Every day | ORAL | Status: DC
  Administered 2017-03-19 – 2017-03-23 (×5): 137 ug via ORAL
  Filled 2017-03-18 (×6): qty 1

## 2017-03-18 MED ORDER — POTASSIUM CHLORIDE IN NACL 20-0.9 MEQ/L-% IV SOLN
INTRAVENOUS | Status: DC
  Administered 2017-03-18: 23:00:00 via INTRAVENOUS
  Filled 2017-03-18 (×3): qty 1000

## 2017-03-18 MED ORDER — FLUTICASONE PROPIONATE HFA 110 MCG/ACT IN AERO: 1.0000 | INHALATION_SPRAY | Freq: Two times a day (BID) | RESPIRATORY_TRACT | Status: DC

## 2017-03-18 MED ORDER — LEVALBUTEROL HCL 0.63 MG/3ML IN NEBU
0.6300 mg | INHALATION_SOLUTION | Freq: Four times a day (QID) | RESPIRATORY_TRACT | Status: DC | PRN
  Administered 2017-03-21: 0.63 mg via RESPIRATORY_TRACT
  Filled 2017-03-18: qty 3

## 2017-03-18 MED ORDER — COLCHICINE 0.6 MG PO TABS
0.6000 mg | ORAL_TABLET | Freq: Two times a day (BID) | ORAL | Status: DC
  Administered 2017-03-19 – 2017-03-24 (×12): 0.6 mg via ORAL
  Filled 2017-03-18 (×14): qty 1

## 2017-03-18 NOTE — ED Notes (Signed)
Lactic acid of 2.1 reported to provider.

## 2017-03-18 NOTE — ED Notes (Signed)
Pt. Has CT angio scan before going to floor.

## 2017-03-18 NOTE — ED Provider Notes (Addendum)
[facility] Emergency Department Provider Note  ____________________________________________   I have reviewed the triage vital signs and the nursing notes.   HISTORY  Chief Complaint Tachycardia    HPI Julie Irwin is a 61 y.o. female presents today feeling short of breath and generally weak for the last 3 days. Starting Friday., Today is Monday. She denies any chest pain, she did have some epigastric abdominal discomfort insofar as her stomach felt "hard". That no longer is happening. Patient does suffer unfortunately promote obesity. She denies any vomiting or diarrhea. She states she's had exertional dyspnea. Upon arrival to the emergency department, she isfound to be in a narcotics tachycardia at 147 which is very steady, she is denying ongoing chest pain she does not) breath as long she sitting there. No personal or family history of PE or DVT no leg swelling, does not endorse orthopnea. Has never felt like this before. Does not have a sensation of palpitations. Is not having any pain at this moment she states.  She does admit to a productive cough but no fever.     Past Medical History:  Diagnosis Date  . Breast cancer [facility])     Patient Active Problem List   Diagnosis Date Noted  . Primary cancer of upper outer quadrant of right female breast ([place]) 02/14/2015    History reviewed. No pertinent surgical history.  Prior to Admission medications   Medication Sig Start Date End Date Taking? Authorizing Provider  alendronate (FOSAMAX) 70 MG tablet TAKE 1 TABLET BY MOUTH  EVERY WEEK 12/16/16   Lloyd Huger, MD  aspirin EC 81 MG tablet Take by mouth.    [provider]  azelastine (ASTELIN) 0.1 % nasal spray instill 1 spray into each nostril twice a day for 7 days then once daily if needed 01/18/15   [provider]  benzonatate (TESSALON) 200 MG capsule Reported on 08/03/2015 01/18/15   [provider]  Cholecalciferol  (VITAMIN D3) 2000 UNITS capsule Take by mouth.    [provider]  cyclobenzaprine (FLEXERIL) 10 MG tablet Take by mouth. 11/19/14   [provider]  DULoxetine (CYMBALTA) 60 MG capsule  01/06/15   [provider]  letrozole [facility]) 2.5 MG tablet Take 1 tablet by mouth once a day 01/05/16   Lloyd Huger, MD  levothyroxine (SYNTHROID, LEVOTHROID) 150 MCG tablet  07/19/15   [provider]  metoprolol succinate (TOPROL-XL) 25 MG 24 hr tablet  11/02/14   [provider]  Omega-3 Fatty Acids (FISH OIL CONCENTRATE) 300 MG CAPS Take by mouth.    [provider]  prochlorperazine (COMPAZINE) 10 MG tablet  11/19/14   [provider]  QVAR 40 MCG/ACT inhaler  12/10/14   [provider]  topiramate (TOPAMAX) 50 MG tablet  11/19/14   [provider]  traMADol (ULTRAM) 50 MG tablet Take by mouth every 6 (six) hours as needed.    [provider]    Allergies Codeine and Sulfa antibiotics  No family history on file.  Social History Social History  Substance Use Topics  . Smoking status: Never Smoker  . Smokeless tobacco: Never Used  . Alcohol use No    Review of Systems Constitutional: No fever/chills Eyes: No visual changes. ENT: No sore throat. No stiff neck no neck pain Cardiovascular: Denies chest pain. Respiratory: Positive shortness of breath. Positive productive cough Gastrointestinal:   no vomiting.  No diarrhea.  No constipation. Genitourinary: Negative for dysuria. Musculoskeletal:  Negative lower extremity swelling Skin: Negative for rash. Neurological: Negative for severe headaches, focal weakness or numbness.   ____________________________________________   PHYSICAL EXAM:  VITAL SIGNS: ED Triage Vitals  Enc Vitals Group     BP 03/18/17 1428 (!) 127/92     Pulse Rate 03/18/17 1428 (!) 147     Resp 03/18/17 1428 18     Temp 03/18/17 1428 98.9 F (37.2 C)     Temp Source 03/18/17 1428  Oral     SpO2 03/18/17 1428 97 %     Weight 03/18/17 1429 220 lb (99.8 kg)     Height --      Head Circumference --      Peak Flow --      Pain Score 03/18/17 1453 6     Pain Loc --      Pain Edu? --      Excl. in [place]? --     Constitutional: Alert and oriented. Well appearing and in no acute distress. Eyes: Conjunctivae are normal Head: Atraumatic HEENT: No congestion/rhinnorhea. Mucous membranes are moist.  Oropharynx non-erythematous Neck:   Nontender with no meningismus, no masses, no stridor Cardiovascular:Tachycardia noted, 140s. Grossly normal heart sounds.  Good peripheral circulation. Respiratory: Normal respiratory effort.  No retractions. Lungs diminished in the bases no rales or rhonchi Abdominal: Soft and nontender. No distention. No guarding no rebound or obesity noted Back:  There is no focal tenderness or step off.  there is no midline tenderness there are no lesions noted. there is no CVA tenderness Musculoskeletal: No lower extremity tenderness, no upper extremity tenderness. No joint effusions, no DVT signs strong distal pulses no edema Neurologic:  Normal speech and language. No gross focal neurologic deficits are appreciated.  Skin:  Skin is warm, dry and intact. No rash noted. Psychiatric: Mood and affect are normal. Speech and behavior are normal.  ____________________________________________   LABS (all labs ordered are listed, but only abnormal results are displayed)  Labs Reviewed  COMPREHENSIVE METABOLIC PANEL  LIPASE, BLOOD  CBC WITH DIFFERENTIAL/PLATELET  TROPONIN I  BRAIN NATRIURETIC PEPTIDE  TSH  PROTIME-INR  APTT   ____________________________________________  EKG  I personally interpreted any EKGs ordered by me or triage Narrow complex tachycardia, rate 147, likely atrial flutter. Normal axis no acute ischemic changes, sinus is also possible  Repeat EKG shows either sinus tach at 137 or 21 flutter at 137, most likely sinus tach with hand,  normal axis no acute ischemic changes ____________________________________________  RADIOLOGY  I reviewed any imaging ordered by me or triage that were performed during my shift and, if possible, patient and/or family made aware of any abnormal findings. ____________________________________________   PROCEDURES  Procedure(s) performed: None  Procedures  Critical Care performed: None  ____________________________________________   INITIAL IMPRESSION / ASSESSMENT AND PLAN / ED COURSE  Pertinent labs & imaging results that were available during my care of the patient were reviewed by me and considered in my medical decision making (see chart for details).  Patient with productive cough and feeling weak, heart rate initially was in the 140s, right now it is 139, repeat EKG shows what is likely a sinus rhythm now that her rate has slowed down. Initially I was concerned that might be a flutter given how regularly it is in the 130s to 140s. We will give her IV fluid. I did initially order verapamil however we will hold that.. And see how she responds to fluid. This is a narrow complex tachycardia. We  will check diffuse blood work, patient is complaining of shortness of breath. She does have a cough, and chest x-ray shows an infiltrate. This certainly could be a reactive tachycardia from a pneumonia. Sats are good however, pressures are holding. We will give fluid and see how she responds. We will send a BNP, . We will continue to observe her closely in the emergency room. Does not appear to be sinus tach to my reading of second EKG  ----------------------------------------- 6:37 PM on 03/18/2017 -----------------------------------------  Hr 129, coming down with fluid, after discussion with the hospitalist we will obtain CT chest to rule out PE. However, patient does have pneumonia on chest x-ray and productive cough.   ----------------------------------------- 8:18 PM on  03/18/2017 -----------------------------------------  Discussed with Dr. Fletcher Anon, cardiology and I discussed the patient's persistent tachycardia, CT findings, blood pressure, and clinical picture in general. He does not feel emergent tap is necessary. I also discussed with on-call radiology, they do not see any evidence of right heart strain on CT, cardiology wishes the patient to be admitted to ICU and they will follow closely. Hospitalist made aware of these findings and will continue care.  ----------------------------------------- 8:48 PM on 03/18/2017 -----------------------------------------  Pt has no jvd no cp.  She actually feels more prnounced sx when she leans forward. She is very comfortable and despite tachycardia she has no sx at this time.  She will need Tap, however, and cardiology and hospitalist aware. Upgrading to icu. Pt under care of hospitalist at this time.     ____________________________________________   FINAL CLINICAL IMPRESSION(S) / ED DIAGNOSES  Final diagnoses:  None      This chart was dictated using voice recognition software.  Despite best efforts to proofread,  errors can occur which can change meaning.      Schuyler Amor, MD 03/18/17 1533    Schuyler Amor, MD 03/18/17 7680    Schuyler Amor, MD 03/18/17 2018    Schuyler Amor, MD 03/18/17 2049

## 2017-03-18 NOTE — Consult Note (Signed)
Name: Julie Irwin MRN: [id number] DOB: [birth year]    ADMISSION DATE:  03/18/2017 CONSULTATION DATE: 03/18/2017  REFERRING MD : Dr. Ether Griffins   CHIEF COMPLAINT: Shortness of Breath  BRIEF PATIENT DESCRIPTION:  61 yo female admitted with acute respiratory failure and SVT CT Angio Chest revealing a large pericardial effusion   SIGNIFICANT EVENTS  08/6-Pt admitted to the Stepdown Unit   STUDIES:  CT Angio Chest 08/6>>Large pericardial effusion.  Cardiology assessment recommended. Small bilateral pleural effusions with atelectasis and ground-glass opacities likely representing stigmata of CHF. No acute pulmonary embolus. Bilateral skin thickening and subcutaneous fatty induration of the left breast with underlying bilateral saline subglandular implants. Clinical correlation recommended. Aortic Atherosclerosis  HISTORY OF PRESENT ILLNESS:   This is a 61 year old female with a past medical history of breast cancer s/p bilateral masectomy 2012 (right upper breast dx completed letrozole 03/2016), hypothyroidism, degenerative disc disease, morbid obesity, hyperlipidemia, anxiety, and depression.  She presented to [facility] ER 08/6 with complaints of cough, shortness of breath, discomfort in the chest radiating to the neck, fatigue, and weakness. Per ER notes she stated the cough has been productive she has been coughing up green phlegm for the past few months, however other symptoms began 2 days prior to presentation to the ER. In the ER chest x-ray concerning for possible pneumonia and CT angios chest revealed large pericardial effusion. While in the ER she was noted to be in SVT with a heart rate in the 140s, therefore she was given 2.5L fluid bolus with heart rate decreasing to the 120's. Due to CT Angio Chest findings Cardiologist Dr. Fletcher Anon contacted by ER physician stat echocardiogram pending, at this time Dr. Fletcher Anon does not feel emergent tap is necessary and on call radiologist reviewed CT findings and  does not see evidence of right heart strain. She was subsequently admitted by the hospitalist team to the stepdown unit for further workup and treatment PCCM consulted.   PAST MEDICAL HISTORY :   has a past medical history of Breast cancer ([place]).  has no past surgical history on file. Prior to Admission medications   Medication Sig Start Date End Date Taking? Authorizing Provider  alendronate (FOSAMAX) 70 MG tablet TAKE 1 TABLET BY MOUTH  EVERY WEEK 12/16/16  Yes Lloyd Huger, MD  aspirin EC 81 MG tablet Take 81 mg by mouth daily.    Yes [provider]  Cholecalciferol (VITAMIN D3) 2000 UNITS capsule Take 2,000 Units by mouth daily.    Yes [provider]  DULoxetine (CYMBALTA) 30 MG capsule 90 mg.  01/06/15  Yes [provider]  fluticasone (FLOVENT HFA) 110 MCG/ACT inhaler Inhale 1 puff into the lungs 2 (two) times daily.   Yes [provider]  levothyroxine (SYNTHROID, LEVOTHROID) 137 MCG tablet Take 137 mcg by mouth daily before breakfast.  07/19/15  Yes [provider]  metoprolol succinate (TOPROL-XL) 25 MG 24 hr tablet Take 75 mg by mouth daily.  11/02/14  Yes [provider]  topiramate (TOPAMAX) 50 MG tablet Take 50 mg by mouth daily.  11/19/14  Yes [provider]  azelastine (ASTELIN) 0.1 % nasal spray instill 1 spray into each nostril twice a day for 7 days then once daily if needed 01/18/15   [provider]  cyclobenzaprine (FLEXERIL) 10 MG tablet Take 10 mg by mouth 3 (three) times daily as needed.  11/19/14   [provider]  traMADol (ULTRAM) 50 MG tablet Take 25-50 mg by mouth every  6 (six) hours as needed.     [provider]   Allergies  Allergen Reactions  . Codeine Other (See Comments)    constipation  . Sulfa Antibiotics Other (See Comments)    Allergy as a child    FAMILY HISTORY:  family history is not on file. SOCIAL HISTORY:  reports that she has never smoked. She has never  used smokeless tobacco. She reports that she does not drink alcohol or use drugs.  REVIEW OF SYSTEMS: Positives in BOLD  Constitutional: fever, chills, weight loss, malaise/fatigue and diaphoresis.  HENT: Negative for hearing loss, ear pain, nosebleeds, congestion, sore throat, neck pain, tinnitus and ear discharge.   Eyes: Negative for blurred vision, double vision, photophobia, pain, discharge and redness.  Respiratory: cough, hemoptysis, sputum production, shortness of breath, wheezing and stridor.   Cardiovascular: chest discomfort, palpitations, orthopnea, claudication, leg swelling and PND.  Gastrointestinal: Negative for heartburn, nausea, vomiting, abdominal pain, diarrhea, constipation, blood in stool and melena.  Genitourinary: Negative for dysuria, urgency, frequency, hematuria and flank pain.  Musculoskeletal: Negative for myalgias, back pain, joint pain and falls.  Skin: Negative for itching and rash.  Neurological: Negative for dizziness, tingling, tremors, sensory change, speech change, focal weakness, seizures, loss of consciousness, weakness and headaches.  Endo/Heme/Allergies: Negative for environmental allergies and polydipsia. Does not bruise/bleed easily.  SUBJECTIVE:  Patient states she is short of breath during exertion.  VITAL SIGNS: Temp:  [98.9 F (37.2 C)] 98.9 F (37.2 C) (08/06 1428) Pulse Rate:  [112-147] 112 (08/06 2156) Resp:  [18] 18 (08/06 2156) BP: (96-127)/(73-92) 96/73 (08/06 2156) SpO2:  [96 %-100 %] 99 % (08/06 2111) Weight:  [99.8 kg (220 lb)] 99.8 kg (220 lb) (08/06 1429)  PHYSICAL EXAMINATION: General: Well-developed, well-nourished Caucasian female, NAD Neuro: Alert and oriented, follows commands HEENT: Supple, no JVD Cardiovascular: Sinus tach, S1-S2, no M/R/G Lungs: Diminished throughout, even, nonlabored; no wheezes, rales, rhonchi, or crackles Abdomen: Hypoactive bowel sounds 4, epigastric tenderness, obese, soft,  nondistended Musculoskeletal: Normal bulk and tone, no edema Skin: Intact no rashes or lesions   Recent Labs Lab 03/18/17 1449  NA 136  K 3.8  CL 104  CO2 21*  BUN 18  CREATININE 0.96  GLUCOSE 148*    Recent Labs Lab 03/18/17 1555  HGB 11.9*  HCT 35.2  WBC 11.5*  PLT 219   Ct Angio Chest Pe W Or Wo Contrast  Result Date: 03/18/2017 CLINICAL DATA:  Tachycardia and dyspnea. EXAM: CT ANGIOGRAPHY CHEST WITH CONTRAST TECHNIQUE: Multidetector CT imaging of the chest was performed using the standard protocol during bolus administration of intravenous contrast. Multiplanar CT image reconstructions and MIPs were obtained to evaluate the vascular anatomy. CONTRAST:  75 cc Isovue 370 IV COMPARISON:  Same day CXR FINDINGS: Cardiovascular: Large pericardial effusion, anteriorly measuring up to 1.9 cm in thickness, 2.9 cm in thickness at the cardiac apex and posteriorly at 2.2 cm in thickness. The heart is otherwise normal in size. There is good opacification of the pulmonary arteries. No pulmonary embolus is identified. No aortic aneurysm or dissection. Minimal aortic atherosclerosis. Mediastinum/Nodes: No enlarged mediastinal, hilar, or axillary lymph nodes. Thyroid gland, trachea, and esophagus demonstrate no significant findings. Lungs/Pleura: Bilateral small to moderate pleural effusions with adjacent atelectasis. No dominant mass. Faint bilateral ground-glass opacities may represent stigmata of pulmonary edema or hypoventilatory change among some possibilities though not exclusive. Upper Abdomen: Bilateral saline subglandular implants with mild bilateral skin thickening and soft tissue induration about the left breast. Clinical  correlation recommended. Musculoskeletal: Dextroscoliosis of the thoracic spine without acute nor suspicious osseous abnormality. Review of the MIP images confirms the above findings. IMPRESSION: 1. Large pericardial effusion.  Cardiology assessment recommended. 2. Small  bilateral pleural effusions with atelectasis and ground-glass opacities likely representing stigmata of CHF. 3. No acute pulmonary embolus. 4. Bilateral skin thickening and subcutaneous fatty induration of the left breast with underlying bilateral saline subglandular implants. Clinical correlation recommended. Aortic Atherosclerosis (ICD10-I70.0). Electronically Signed   By: Ashley Royalty M.D.   On: 03/18/2017 20:09   Dg Chest Port 1 View  Result Date: 03/18/2017 CLINICAL DATA:  Tachycardia and pain EXAM: PORTABLE CHEST 1 VIEW COMPARISON:  None. FINDINGS: Cardiac shadow is enlarged. Increased density is noted over the left base in part related to overlying soft tissue although some effusion and infiltrate is likely present as well. No acute bony abnormality is seen. IMPRESSION: Increased density over the left base likely representing a combination of effusion and infiltrate. Two-view chest is recommended when the patient's condition allows. Electronically Signed   By: Inez Catalina M.D.   On: 03/18/2017 15:07    ASSESSMENT / PLAN: Acute hypoxic respiratory failure likely secondary to large pericardial effusion Incidental finding of large pericardial effusion (CT Angio Chest findings 03/18/17) Lactic acidosis Mild leukocytosis Anemia without blood loss P: Supplemental O2 to maintain O2 sats greater than 92% or for dyspnea Prn chest x-ray Cardiology consulted appreciate input Stat echocardiogram pending  Maintain map greater than 65 Continue NS with 20 meq K+@ 75 ml/hr Continuous telemetry monitoring Trend troponins Trend WBC and monitor fever curve Trend PCT and lactic acid Follow cultures Continue empiric abx for now  TSH pending Lovenox for DVT prophylaxis Trend CBC Transfuse for Hgb of less than [place], [place] Pager [phone] (please enter 7 digits) PCCM Consult Pager [phone] (please enter 7 digits)

## 2017-03-18 NOTE — ED Notes (Signed)
Lab reported lactic acid of 2.2, doctor Mcshane notified.

## 2017-03-18 NOTE — Progress Notes (Signed)
Lab called with troponin of 0.07.  Relayed information to [employer], rn.

## 2017-03-18 NOTE — ED Triage Notes (Signed)
Pt to ed from [facility] for elevated HR, hr at triage 140's, EKG done shown to Dr Jimmye Norman, pt taken straight to room 16.  Pt reports abd distention and mild pain and overall weakness for several days.

## 2017-03-18 NOTE — H&P (Signed)
Julie Irwin at [place] NAME: Julie Irwin    MR#:  [id number]  DATE OF BIRTH:  1956/02/29  DATE OF ADMISSION:  03/18/2017  PRIMARY CARE PHYSICIAN: Adin Hector, MD   REQUESTING/REFERRING PHYSICIAN:   CHIEF COMPLAINT:   Chief Complaint  Patient presents with  . Tachycardia    HISTORY OF PRESENT ILLNESS: Julie Irwin  is a 61 y.o. female with a known history of Breast cancer, hypothyroidism, degenerative disc disease, morbid obesity, hyperlipidemia, anxiety/depression, who presents to the hospital with complaints of cough, shortness of breath, green phlegm production, discomfort in the chest, going up into the neck area, sweating, fatigue and weak. He went to the patient, you she was doing well up until 2 days ago when she started noticing discomfort in the chest, dyspnea, weakness, she also noted cough and green phlegm production for the past few months. On arrival to the hospital. Her chest x-ray was concerning for possible pneumonia and hospitalist services were contacted for admission. While in the emergency room she was noted to be in SVT with heart rate around 140. She was given small doses of verapamil after which she dropped the blood pressure to 70s, heart rate slowed down to 120s, and it was sinus tachycardia.  PAST MEDICAL HISTORY:   Past Medical History:  Diagnosis Date  . Breast cancer ([place])     PAST SURGICAL HISTORY: History reviewed. No pertinent surgical history.  SOCIAL HISTORY:  Social History  Substance Use Topics  . Smoking status: Never Smoker  . Smokeless tobacco: Never Used  . Alcohol use No    FAMILY HISTORY: No family history on file.  DRUG ALLERGIES:  Allergies  Allergen Reactions  . Codeine Other (See Comments)    constipation  . Sulfa Antibiotics Other (See Comments)    Allergy as a child    Review of Systems  Constitutional: Positive for malaise/fatigue. Negative for chills, fever and  weight loss.  HENT: Positive for congestion.   Eyes: Negative for blurred vision and double vision.  Respiratory: Positive for cough, sputum production, shortness of breath and wheezing.   Cardiovascular: Positive for chest pain. Negative for palpitations, orthopnea, leg swelling and PND.  Gastrointestinal: Negative for abdominal pain, blood in stool, constipation, diarrhea, nausea and vomiting.  Genitourinary: Negative for dysuria, frequency, hematuria and urgency.  Musculoskeletal: Negative for falls.  Neurological: Positive for dizziness. Negative for tremors, focal weakness and headaches.  Endo/Heme/Allergies: Does not bruise/bleed easily.  Psychiatric/Behavioral: Negative for depression. The patient does not have insomnia.     MEDICATIONS AT HOME:  Prior to Admission medications   Medication Sig Start Date End Date Taking? Authorizing Provider  alendronate (FOSAMAX) 70 MG tablet TAKE 1 TABLET BY MOUTH  EVERY WEEK 12/16/16  Yes Lloyd Huger, MD  aspirin EC 81 MG tablet Take 81 mg by mouth daily.    Yes [provider]  Cholecalciferol (VITAMIN D3) 2000 UNITS capsule Take 2,000 Units by mouth daily.    Yes [provider]  DULoxetine (CYMBALTA) 30 MG capsule 90 mg.  01/06/15  Yes [provider]  fluticasone (FLOVENT HFA) 110 MCG/ACT inhaler Inhale 1 puff into the lungs 2 (two) times daily.   Yes [provider]  levothyroxine (SYNTHROID, LEVOTHROID) 137 MCG tablet Take 137 mcg by mouth daily before breakfast.  07/19/15  Yes [provider]  metoprolol succinate (TOPROL-XL) 25 MG 24 hr tablet Take 75 mg by mouth daily.  11/02/14  Yes [provider]  topiramate (TOPAMAX) 50 MG tablet Take 50 mg by mouth daily.  11/19/14  Yes [provider]  azelastine (ASTELIN) 0.1 % nasal spray instill 1 spray into each nostril twice a day for 7 days then once daily if needed 01/18/15   [provider]  cyclobenzaprine (FLEXERIL) 10  MG tablet Take 10 mg by mouth 3 (three) times daily as needed.  11/19/14   [provider]  traMADol (ULTRAM) 50 MG tablet Take 25-50 mg by mouth every 6 (six) hours as needed.     [provider]      PHYSICAL EXAMINATION:   VITAL SIGNS: Blood pressure 103/87, pulse (!) 132, temperature 98.9 F (37.2 C), temperature source Oral, resp. rate 18, weight 99.8 kg (220 lb), SpO2 99 %.  GENERAL:  61 y.o.-year-old patient lying in the bedIn mild discomfort due to shortness of breath.  EYES: Pupils equal, round, reactive to light and accommodation. No scleral icterus. Extraocular muscles intact.  HEENT: Head atraumatic, normocephalic. Oropharynx and nasopharynx clear.  NECK:  Supple, no jugular venous distention. No thyroid enlargement, no tenderness.  LUNGS: Normal breath sounds bilaterally, no wheezing, rales,rhonchi or crepitation. Intermittent use of accessory muscles of respiration., With movements and the speech  CARDIOVASCULAR: S1, S2 , tachycardic rate with regular. No murmurs, rubs, or gallops.  ABDOMEN: Soft, mild discomfort on palpation in the upper abdomen but no rebound or guarding, nondistended. Bowel sounds present. No organomegaly or mass.  EXTREMITIES: No pedal edema, cyanosis, or clubbing. Mild discomfort on left calf palpation NEUROLOGIC: Cranial nerves II through XII are intact. Muscle strength 5/5 in all extremities. Sensation intact. Gait not checked.  PSYCHIATRIC: The patient is alert and oriented x 3.  SKIN: No obvious rash, lesion, or ulcer.   LABORATORY PANEL:   CBC  Recent Labs Lab 03/18/17 1555  WBC 11.5*  HGB 11.9*  HCT 35.2  PLT 219  MCV 93.5  MCH 31.7  MCHC 33.9  RDW 13.0  LYMPHSABS 1.2  MONOABS 1.0*  EOSABS 0.0  BASOSABS 0.0   ------------------------------------------------------------------------------------------------------------------  Chemistries   Recent Labs Lab 03/18/17 1449  NA 136  K 3.8  CL 104  CO2 21*  GLUCOSE  148*  BUN 18  CREATININE 0.96  CALCIUM 9.2  AST 46*  ALT 60*  ALKPHOS 72  BILITOT 1.2   ------------------------------------------------------------------------------------------------------------------  Cardiac Enzymes  Recent Labs Lab 03/18/17 1449  TROPONINI <0.03   ------------------------------------------------------------------------------------------------------------------  RADIOLOGY: Dg Chest Port 1 View  Result Date: 03/18/2017 CLINICAL DATA:  Tachycardia and pain EXAM: PORTABLE CHEST 1 VIEW COMPARISON:  None. FINDINGS: Cardiac shadow is enlarged. Increased density is noted over the left base in part related to overlying soft tissue although some effusion and infiltrate is likely present as well. No acute bony abnormality is seen. IMPRESSION: Increased density over the left base likely representing a combination of effusion and infiltrate. Two-view chest is recommended when the patient's condition allows. Electronically Signed   By: Inez Catalina M.D.   On: 03/18/2017 15:07    EKG: Orders placed or performed during the hospital encounter of 03/18/17  . EKG 12-Lead  . EKG 12-Lead  EKG in emergency room revealed SVT rate of 142 bpm, nonspecific ST-T changes, repeat EKG reveals sinus tachycardia at rate 137, ventricular premature complexes, low voltage QRS in precordial leads, nonspecific ST-T changes and diffuse leads  IMPRESSION AND PLAN:  Active Problems:   Acute dyspnea   SVT (supraventricular tachycardia) ([place])   Community acquired  pneumonia   Lactic acidosis   Leukocytosis   Pneumonia  #1. Acute dyspnea, likely related to community-acquired pneumonia, get CT angiogram of the chest to rule out pulmonary embolism due to history of breast carcinoma, continue oxygen therapy as needed #2. SVT, likely related to lactic acidosis, continue patient on IV fluids, initiate her on metoprolol, follow cardiac enzymes, sensory, TSH was checked, it was found to be normal, get  cardiologist involved #3, lactic acidosis, continue IV fluids, follow lactic acid level closely #4. Leukocytosis, follow with therapy #5. Cognitive white pneumonia, initiate patient on Rocephin and Zithromax, get sputum cultures, adjust antibiotics depending on the culture results, get CT angiogram of the chest  All the records are reviewed and case discussed with ED provider. Management plans discussed with the patient, family and they are in agreement.  CODE STATUS: Code Status History    This patient does not have a recorded code status. Please follow your organizational policy for patients in this situation.       TOTAL TIME TAKING CARE OF THIS PATIENT: 60 minutes.    Theodoro Grist M.D on 03/18/2017 at 6:01 PM  Between 7am to 6pm - Pager - [phone] After 6pm go to [email or web address] - password [email or web address] [facility]  [place] [facility]  Office  [phone]  CC: Primary care physician; Adin Hector, MD

## 2017-03-18 NOTE — ED Notes (Signed)
Pt. Helped to ambulate to toilet in room.

## 2017-03-19 ENCOUNTER — Encounter
Admission: EM | Disposition: A | Discharge status: Home / Self Care | Payer: Self-pay | Source: Home / Self Care | Attending: Internal Medicine

## 2017-03-19 ENCOUNTER — Inpatient Hospital Stay (HOSPITAL_COMMUNITY)
Admit: 2017-03-19 | Discharge: 2017-03-19 | Disposition: A | Discharge status: Home / Self Care | Payer: 59 | Attending: Internal Medicine | Admitting: Internal Medicine

## 2017-03-19 ENCOUNTER — Inpatient Hospital Stay: Payer: 59

## 2017-03-19 ENCOUNTER — Encounter: Payer: Self-pay | Admitting: Physician Assistant

## 2017-03-19 DIAGNOSIS — I471 Supraventricular tachycardia: Secondary | ICD-10-CM

## 2017-03-19 DIAGNOSIS — I319 Disease of pericardium, unspecified: Secondary | ICD-10-CM

## 2017-03-19 DIAGNOSIS — I3139 Other pericardial effusion (noninflammatory): Secondary | ICD-10-CM

## 2017-03-19 DIAGNOSIS — I313 Pericardial effusion (noninflammatory): Principal | ICD-10-CM

## 2017-03-19 DIAGNOSIS — R0600 Dyspnea, unspecified: Secondary | ICD-10-CM

## 2017-03-19 DIAGNOSIS — I314 Cardiac tamponade: Secondary | ICD-10-CM

## 2017-03-19 HISTORY — DX: Other pericardial effusion (noninflammatory): I31.39

## 2017-03-19 HISTORY — DX: Pericardial effusion (noninflammatory): I31.3

## 2017-03-19 HISTORY — PX: PERICARDIOCENTESIS: CATH118255

## 2017-03-19 LAB — MAGNESIUM: MAGNESIUM: 1.8 mg/dL (ref 1.7–2.4)

## 2017-03-19 LAB — CBC
HEMATOCRIT: 36.7 % (ref 35.0–47.0)
Hemoglobin: 12.4 g/dL (ref 12.0–16.0)
MCH: 32 pg (ref 26.0–34.0)
MCHC: 33.9 g/dL (ref 32.0–36.0)
MCV: 94.5 fL (ref 80.0–100.0)
PLATELETS: 238 10*3/uL (ref 150–440)
RBC: 3.88 MIL/uL (ref 3.80–5.20)
RDW: 13.2 % (ref 11.5–14.5)
WBC: 10.9 10*3/uL (ref 3.6–11.0)

## 2017-03-19 LAB — BODY FLUID CELL COUNT WITH DIFFERENTIAL
Eos, Fluid: 1 %
LYMPHS FL: 17 %
Monocyte-Macrophage-Serous Fluid: 23 %
Neutrophil Count, Fluid: 59 %
OTHER CELLS FL: 0 %
Total Nucleated Cell Count, Fluid: 3782 cu mm

## 2017-03-19 LAB — BASIC METABOLIC PANEL
Anion gap: 9 (ref 5–15)
BUN: 16 mg/dL (ref 6–20)
CHLORIDE: 109 mmol/L (ref 101–111)
CO2: 20 mmol/L — ABNORMAL LOW (ref 22–32)
Calcium: 8.2 mg/dL — ABNORMAL LOW (ref 8.9–10.3)
Creatinine, Ser: 0.71 mg/dL (ref 0.44–1.00)
GFR calc Af Amer: >60 mL/min (ref >60)
Glucose, Bld: 144 mg/dL — ABNORMAL HIGH (ref 65–99)
POTASSIUM: 3.7 mmol/L (ref 3.5–5.1)
Sodium: 138 mmol/L (ref 135–145)

## 2017-03-19 LAB — ECHOCARDIOGRAM COMPLETE
Height: 63 in
Weight: 3756.64 oz

## 2017-03-19 LAB — PROTIME-INR
INR: 1.23
PROTHROMBIN TIME: 15.6 s — AB (ref 11.4–15.2)

## 2017-03-19 LAB — PROCALCITONIN: Procalcitonin: <0.1 ng/mL

## 2017-03-19 LAB — GLUCOSE, CAPILLARY
GLUCOSE-CAPILLARY: 150 mg/dL — AB (ref 65–99)
GLUCOSE-CAPILLARY: 103 mg/dL — AB (ref 65–99)

## 2017-03-19 LAB — TROPONIN I
TROPONIN I: 0.35 ng/mL — AB (ref <0.03)
Troponin I: 0.08 ng/mL (ref <0.03)

## 2017-03-19 LAB — MRSA PCR SCREENING: MRSA by PCR: NEGATIVE

## 2017-03-19 LAB — PROTEIN, TOTAL: TOTAL PROTEIN: 6.5 g/dL (ref 6.5–8.1)

## 2017-03-19 SURGERY — PERICARDIOCENTESIS
Anesthesia: Moderate Sedation

## 2017-03-19 MED ORDER — PANTOPRAZOLE SODIUM 40 MG IV SOLR
40.0000 mg | INTRAVENOUS | Status: DC
  Administered 2017-03-19 – 2017-03-23 (×6): 40 mg via INTRAVENOUS
  Filled 2017-03-19 (×6): qty 40

## 2017-03-19 MED ORDER — HEPARIN (PORCINE) IN NACL 2-0.9 UNIT/ML-% IJ SOLN
INTRAMUSCULAR | Status: AC
  Filled 2017-03-19: qty 500

## 2017-03-19 MED ORDER — SODIUM CHLORIDE 0.9% FLUSH: 3.0000 mL | INTRAVENOUS | Status: DC | PRN

## 2017-03-19 MED ORDER — METOPROLOL TARTRATE 5 MG/5ML IV SOLN
INTRAVENOUS | Status: AC
  Filled 2017-03-19: qty 5

## 2017-03-19 MED ORDER — MIDAZOLAM HCL 2 MG/2ML IJ SOLN
INTRAMUSCULAR | Status: DC | PRN
  Administered 2017-03-19 (×2): 0.5 mg via INTRAVENOUS

## 2017-03-19 MED ORDER — SODIUM CHLORIDE 0.9% FLUSH: 3.0000 mL | Freq: Two times a day (BID) | INTRAVENOUS | Status: DC

## 2017-03-19 MED ORDER — STERILE WATER FOR INJECTION IJ SOLN
INTRAMUSCULAR | Status: AC
  Administered 2017-03-19: 10 mL
  Filled 2017-03-19: qty 10

## 2017-03-19 MED ORDER — SODIUM CHLORIDE 0.9 % IV SOLN
INTRAVENOUS | Status: DC
  Administered 2017-03-19: 10:00:00 via INTRAVENOUS

## 2017-03-19 MED ORDER — METOPROLOL TARTRATE 25 MG PO TABS
25.0000 mg | ORAL_TABLET | Freq: Two times a day (BID) | ORAL | Status: DC
  Administered 2017-03-19 – 2017-03-21 (×4): 25 mg via ORAL
  Filled 2017-03-19 (×5): qty 1

## 2017-03-19 MED ORDER — ALPRAZOLAM 0.5 MG PO TABS
0.5000 mg | ORAL_TABLET | Freq: Three times a day (TID) | ORAL | Status: DC | PRN
  Administered 2017-03-20 – 2017-03-24 (×3): 0.5 mg via ORAL
  Filled 2017-03-19 (×3): qty 1

## 2017-03-19 MED ORDER — FENTANYL CITRATE (PF) 100 MCG/2ML IJ SOLN
INTRAMUSCULAR | Status: AC
  Filled 2017-03-19: qty 2

## 2017-03-19 MED ORDER — METOPROLOL TARTRATE 5 MG/5ML IV SOLN
INTRAVENOUS | Status: DC | PRN
  Administered 2017-03-19: 2.5 mg via INTRAVENOUS

## 2017-03-19 MED ORDER — METOPROLOL TARTRATE 5 MG/5ML IV SOLN
2.5000 mg | Freq: Once | INTRAVENOUS | Status: AC
  Administered 2017-03-19: 2.5 mg via INTRAVENOUS

## 2017-03-19 MED ORDER — LIDOCAINE HCL (PF) 1 % IJ SOLN
INTRAMUSCULAR | Status: AC
  Filled 2017-03-19: qty 30

## 2017-03-19 MED ORDER — SODIUM CHLORIDE 0.9 % IV SOLN: 250.0000 mL | INTRAVENOUS | Status: DC | PRN

## 2017-03-19 MED ORDER — MIDAZOLAM HCL 2 MG/2ML IJ SOLN
INTRAMUSCULAR | Status: AC
  Filled 2017-03-19: qty 2

## 2017-03-19 MED ORDER — OXYCODONE HCL 5 MG PO TABS
5.0000 mg | ORAL_TABLET | ORAL | Status: DC | PRN
  Administered 2017-03-20 – 2017-03-21 (×3): 5 mg via ORAL
  Filled 2017-03-19 (×3): qty 1

## 2017-03-19 MED ORDER — FENTANYL CITRATE (PF) 100 MCG/2ML IJ SOLN
INTRAMUSCULAR | Status: DC | PRN
  Administered 2017-03-19 (×2): 25 ug via INTRAVENOUS

## 2017-03-19 MED ORDER — SODIUM CHLORIDE 0.9 % IV BOLUS (SEPSIS): 250.0000 mL | Freq: Once | INTRAVENOUS | Status: DC

## 2017-03-19 SURGICAL SUPPLY — 6 items
EVACUATOR 1/8 PVC DRAIN (DRAIN) ×3 IMPLANT
PACK CARDIAC CATH (CUSTOM PROCEDURE TRAY) ×3 IMPLANT
PERIVAC PERICARDIOCENTESIS 8.3 (TRAY / TRAY PROCEDURE) ×6 IMPLANT
PROTECTION STATION PRESSURIZED (MISCELLANEOUS) ×3
SET INTRO CAPELLA COAXIAL (SET/KITS/TRAYS/PACK) ×3 IMPLANT
STATION PROTECTION PRESSURIZED (MISCELLANEOUS) ×1 IMPLANT

## 2017-03-19 NOTE — Consult Note (Signed)
Cardiology Consultation Note  Patient ID: Julie Irwin, MRN: [id number], DOB/AGE: [birth year] 61 y.o. Admit date: 03/18/2017   Date of Consult: 03/19/2017 Primary Physician: Adin Hector, MD Primary Cardiologist: New to [facility] - consult by End Requesting Physician: Dr. Ether Griffins, MD  Chief Complaint: SOB Reason for Consult: Pericardial effusion  HPI: Julie Irwin is a 61 y.o. female who is being seen today for the evaluation of pericardial effusion at the request of Dr. Ether Griffins, MD. Patient has a h/o right-sided breast cancer status post bilateral mastectomy in 2012 status post letrozole and Herceptin status post reconstructive surgery of bilateral implants, hypothyroidism, degenerative disc disease, morbid obesity, hyperlipidemia, anxiety, and depression who presented to [facility] on 03/18/2017 with complaints of cough, shortness of breath, and discomfort in the chest radiating to the neck with associated fatigue and weakness. She was found to have a large pericardial effusion. Cardiology is asked to further evaluate.  No previously known cardiac history the patient does report a remote stress test years prior that was normal per her report. Patient reports she is "cancer free." She continues to follow-up with oncology. At baseline, patient sleeps propped up or in a recliner secondary to low back and bilateral hip pain. For the past several weeks to month or 2 the patient has noticed increased fatigue, weakness and more recently increased shortness of breath. Over the past couple of days the patient has noted a significant drop off in her functional capacity with significant fatigue and weakness. On 8/3 patient noted she was becoming significantly short of breath with tasks such as dressing herself or drying off after a shower. This continued to get worse on 8/5. On 8/6, patient required assistance from her husband with drying off after her shower. There was some associated chest heaviness though no  diaphoresis, nausea, vomiting, dizziness, palpitations, presyncope, or syncope. She denies any recent viral illnesses.  In the ER chest x-ray concerning for possible pneumonia and CT angios chest revealed large pericardial effusion. While in the ER she was noted to be in SVT with a heart rate in the 140s, therefore she was given 2.5L fluid bolus with heart rate decreasing to the 120's. Vital signs have been slightly soft with blood pressure running in the mid-90s to low 397Q systolic. She continues to be tachycardic with heart rate in the 120s with telemetry appearing to show sinus tachycardia currently. Troponin mildly elevated with a peak of 0.08 currently. Serum creatinine 0.96, potassium 3.8, initial white blood cell count 11.5, hemoglobin 11.9, platelet count 219. Lactic acid 2.2, blood culture no growth less than 24 hours, initial d-dimer elevated at 6822 with CT a chest negative for PE and showing a large pericardial effusion as above. TSH normal, BNP mildly elevated at 114. Vital signs have remained stable though she has been noted to be tachycardic and hypotensive as above. Echocardiogram this morning showed EF 55-65%, study not technically sufficient to allow for LV diastolic function, RV cavity size was normal to small with early diastolic collapse with normal systolic function. A large pericardial effusion was identified circumferential to the heart. There was right ventricular chamber collapse. Findings were suggestive of tamponade not physiology, though evaluation was limited by lack of mitral and tricuspid inflow velocities.   Past Medical History:  Diagnosis Date  . Breast cancer ([place])   . H/O degenerative disc disease   . Hyperlipidemia   . Hypothyroidism   . Morbid obesity ([place])       Most Recent Cardiac  Studies: TTE 03/19/2017:  Study Conclusions  - Left ventricle: The cavity size was normal. Wall thickness was   normal. Systolic function was normal. The estimated ejection    fraction was in the range of 55% to 65%. The study is not   technically sufficient to allow evaluation of LV diastolic   function. - Right ventricle: The cavity size was normal to small with early   diastolic collapse. Systolic function was normal. - Pericardium, extracardiac: A large pericardial effusion was   identified circumferential to the heart. There was right   ventricular chamber collapse. Findings are suggestive of   tamponade physiology, though evaluation is limited by lack of   mitral and tricuspid inflow velocities.   Surgical History: History reviewed. No pertinent surgical history.   Home Meds: Prior to Admission medications   Medication Sig Start Date End Date Taking? Authorizing Provider  alendronate (FOSAMAX) 70 MG tablet TAKE 1 TABLET BY MOUTH  EVERY WEEK 12/16/16  Yes Lloyd Huger, MD  aspirin EC 81 MG tablet Take 81 mg by mouth daily.    Yes [provider]  Cholecalciferol (VITAMIN D3) 2000 UNITS capsule Take 2,000 Units by mouth daily.    Yes [provider]  DULoxetine (CYMBALTA) 30 MG capsule 90 mg.  01/06/15  Yes [provider]  fluticasone (FLOVENT HFA) 110 MCG/ACT inhaler Inhale 1 puff into the lungs 2 (two) times daily.   Yes [provider]  levothyroxine (SYNTHROID, LEVOTHROID) 137 MCG tablet Take 137 mcg by mouth daily before breakfast.  07/19/15  Yes [provider]  metoprolol succinate (TOPROL-XL) 25 MG 24 hr tablet Take 75 mg by mouth daily.  11/02/14  Yes [provider]  topiramate (TOPAMAX) 50 MG tablet Take 50 mg by mouth daily.  11/19/14  Yes [provider]  azelastine (ASTELIN) 0.1 % nasal spray instill 1 spray into each nostril twice a day for 7 days then once daily if needed 01/18/15   [provider]  cyclobenzaprine (FLEXERIL) 10 MG tablet Take 10 mg by mouth 3 (three) times daily as needed.  11/19/14   [provider]  traMADol (ULTRAM) 50 MG tablet Take 25-50 mg by  mouth every 6 (six) hours as needed.     [provider]    Inpatient Medications:  . aspirin EC  81 mg Oral Daily  . budesonide (PULMICORT) nebulizer solution  0.25 mg Nebulization BID  . colchicine  0.6 mg Oral BID  . docusate sodium  100 mg Oral BID  . DULoxetine  90 mg Oral Daily  . guaiFENesin  600 mg Oral BID  . levalbuterol  0.63 mg Nebulization Q6H  . levothyroxine  137 mcg Oral QAC breakfast  . pantoprazole (PROTONIX) IV  40 mg Intravenous Q24H  . sodium chloride flush  3 mL Intravenous Q12H  . sodium chloride flush  3 mL Intravenous Q12H  . tiotropium  18 mcg Inhalation Daily  . topiramate  50 mg Oral Daily   . sodium chloride    . sodium chloride    . sodium chloride    . 0.9 % NaCl with KCl 20 mEq / L 75 mL/hr at 03/19/17 0650  . azithromycin    . cefTRIAXone (ROCEPHIN)  IV      Allergies:  Allergies  Allergen Reactions  . Codeine Other (See Comments)    constipation  . Sulfa Antibiotics Other (See Comments)    Allergy as a child    Social History  Social History  . Marital status: Married    Spouse name: N/A  . Number of children: N/A  . Years of education: N/A   Occupational History  . Not on file.   Social History Main Topics  . Smoking status: Never Smoker  . Smokeless tobacco: Never Used  . Alcohol use No  . Drug use: No  . Sexual activity: Not on file   Other Topics Concern  . Not on file   Social History Narrative  . No narrative on file     Family History  Problem Relation Age of Onset  . Colon polyps Father      Review of Systems: Review of Systems  Constitutional: Positive for malaise/fatigue. Negative for chills, diaphoresis, fever and weight loss.  HENT: Negative for congestion.   Eyes: Negative for discharge and redness.  Respiratory: Positive for shortness of breath. Negative for cough, hemoptysis, sputum production and wheezing.   Cardiovascular: Positive for chest pain and orthopnea. Negative for  palpitations, claudication, leg swelling and PND.  Gastrointestinal: Negative for abdominal pain, blood in stool, heartburn, melena, nausea and vomiting.  Genitourinary: Negative for hematuria.  Musculoskeletal: Positive for back pain and joint pain. Negative for falls and myalgias.  Skin: Negative for rash.  Neurological: Positive for weakness. Negative for dizziness, tingling, tremors, sensory change, speech change, focal weakness and loss of consciousness.  Endo/Heme/Allergies: Does not bruise/bleed easily.  Psychiatric/Behavioral: Negative for substance abuse. The patient is not nervous/anxious.   All other systems reviewed and are negative.   Labs:  Recent Labs  03/18/17 1449 03/18/17 2250 03/19/17 0445  TROPONINI <0.03 0.07* 0.08*   Lab Results  Component Value Date   WBC 10.9 03/19/2017   HGB 12.4 03/19/2017   HCT 36.7 03/19/2017   MCV 94.5 03/19/2017   PLT 238 03/19/2017     Recent Labs Lab 03/18/17 1449 03/19/17 0445  NA 136 138  K 3.8 3.7  CL 104 109  CO2 21* 20*  BUN 18 16  CREATININE 0.96 0.71  CALCIUM 9.2 8.2*  PROT 7.3  --   BILITOT 1.2  --   ALKPHOS 72  --   ALT 60*  --   AST 46*  --   GLUCOSE 148* 144*   No results found for: CHOL, HDL, LDLCALC, TRIG No results found for: DDIMER  Radiology/Studies:  Ct Angio Chest Pe W Or Wo Contrast  Result Date: 03/18/2017 IMPRESSION: 1. Large pericardial effusion.  Cardiology assessment recommended. 2. Small bilateral pleural effusions with atelectasis and ground-glass opacities likely representing stigmata of CHF. 3. No acute pulmonary embolus. 4. Bilateral skin thickening and subcutaneous fatty induration of the left breast with underlying bilateral saline subglandular implants. Clinical correlation recommended. Aortic Atherosclerosis (ICD10-I70.0). Electronically Signed   By: Ashley Royalty M.D.   On: 03/18/2017 20:09   Dg Chest Port 1 View  Result Date: 03/18/2017 IMPRESSION: Increased density over the left  base likely representing a combination of effusion and infiltrate. Two-view chest is recommended when the patient's condition allows. Electronically Signed   By: Inez Catalina M.D.   On: 03/18/2017 15:07    EKG: Interpreted by me showed: SVT, 147 bpm, low voltage QRS, nonspecific ST-T changes Telemetry: Interpreted by me showed: Currently in sinus tachycardia with heart rates in the 120s bpm. Episodes of atrial tachycardia versus SVT into the 150s upon admission.  Weights: Filed Weights   03/18/17 1429 03/18/17 2300  Weight: 220 lb (99.8 kg) 234 lb 12.6 oz (106.5 kg)  Physical Exam: Blood pressure (!) 114/95, pulse (!) 119, temperature 98 F (36.7 C), temperature source Oral, resp. rate (!) 25, height 5\' 3"  (1.6 m), weight 234 lb 12.6 oz (106.5 kg), SpO2 96 %. Body mass index is 41.59 kg/m. General: Well developed, well nourished, in no acute distress. Head: Normocephalic, atraumatic, sclera non-icteric, no xanthomas, nares are without discharge.  Neck: Negative for carotid bruits. JVD not elevated. Lungs: Clear bilaterally to auscultation without wheezes, rales, or rhonchi. Breathing is unlabored. Heart: RRR with S1 S2. Distant heart sounds. No murmurs, rubs, or gallops appreciated. Pulses paradoxus difficult to auscultate. Abdomen: Soft, non-tender, non-distended with normoactive bowel sounds. No hepatomegaly. No rebound/guarding. No obvious abdominal masses. Msk:  Strength and tone appear normal for age. Extremities: No clubbing or cyanosis. No edema. Distal pedal pulses are 2+ and equal bilaterally. Neuro: Alert and oriented X 3. No facial asymmetry. No focal deficit. Moves all extremities spontaneously. Psych:  Responds to questions appropriately with a normal affect.    Assessment and Plan:  Principal Problem:   Pericardial effusion Active Problems:   Acute dyspnea   SVT (supraventricular tachycardia) (H[facility])   Lactic acidosis   Leukocytosis    1. Pericardial effusion  with findings suggestive of possible tamponade physiology: -Currently hemodynamically stable -Plan for pericardiocentesis this morning with Dr. Saunders Revel -Risks and benefits of procedure have been discussed with patient in detail by Dr. Saunders Revel -Will likely need to leave pericardial drain in place until less than 50 cc per day is draining -Will send pericardial fluid off for laboratory and pathology analysis -Cannot rule out malignancy at this time -Continue ASA  2. Atrial tachycardia versus PSVT: -Likely in the setting of #1 -Improved -Replete potassium to goal 4.0 -Check magnesium with recommendation to replete to goal 2.0 -TSH normal  3. Remaining per IM   Signed, Christell Faith, PA-C [facility] Pager: [phone] 03/19/2017, 9:31 AM

## 2017-03-19 NOTE — H&P (View-Only) (Signed)
Cardiology Consultation Note  Patient ID: Julie Irwin, MRN: [id number], DOB/AGE: 1955/09/04 61 y.o. Admit date: 03/18/2017   Date of Consult: 03/19/2017 Primary Physician: Adin Hector, MD Primary Cardiologist: New to [facility] - consult by End Requesting Physician: Dr. Ether Griffins, MD  Chief Complaint: SOB Reason for Consult: Pericardial effusion  HPI: Julie Irwin is a 61 y.o. female who is being seen today for the evaluation of pericardial effusion at the request of Dr. Ether Griffins, MD. Patient has a h/o right-sided breast cancer status post bilateral mastectomy in 2012 status post letrozole and Herceptin status post reconstructive surgery of bilateral implants, hypothyroidism, degenerative disc disease, morbid obesity, hyperlipidemia, anxiety, and depression who presented to [facility] on 03/18/2017 with complaints of cough, shortness of breath, and discomfort in the chest radiating to the neck with associated fatigue and weakness. She was found to have a large pericardial effusion. Cardiology is asked to further evaluate.  No previously known cardiac history the patient does report a remote stress test years prior that was normal per her report. Patient reports she is "cancer free." She continues to follow-up with oncology. At baseline, patient sleeps propped up or in a recliner secondary to low back and bilateral hip pain. For the past several weeks to month or 2 the patient has noticed increased fatigue, weakness and more recently increased shortness of breath. Over the past couple of days the patient has noted a significant drop off in her functional capacity with significant fatigue and weakness. On 8/3 patient noted she was becoming significantly short of breath with tasks such as dressing herself or drying off after a shower. This continued to get worse on 8/5. On 8/6, patient required assistance from her husband with drying off after her shower. There was some associated chest heaviness though no  diaphoresis, nausea, vomiting, dizziness, palpitations, presyncope, or syncope. She denies any recent viral illnesses.  In the ER chest x-ray concerning for possible pneumonia and CT angios chest revealed large pericardial effusion. While in the ER she was noted to be in SVT with a heart rate in the 140s, therefore she was given 2.5L fluid bolus with heart rate decreasing to the 120's. Vital signs have been slightly soft with blood pressure running in the mid-90s to low 570V systolic. She continues to be tachycardic with heart rate in the 120s with telemetry appearing to show sinus tachycardia currently. Troponin mildly elevated with a peak of 0.08 currently. Serum creatinine 0.96, potassium 3.8, initial white blood cell count 11.5, hemoglobin 11.9, platelet count 219. Lactic acid 2.2, blood culture no growth less than 24 hours, initial d-dimer elevated at 6822 with CT a chest negative for PE and showing a large pericardial effusion as above. TSH normal, BNP mildly elevated at 114. Vital signs have remained stable though she has been noted to be tachycardic and hypotensive as above. Echocardiogram this morning showed EF 55-65%, study not technically sufficient to allow for LV diastolic function, RV cavity size was normal to small with early diastolic collapse with normal systolic function. A large pericardial effusion was identified circumferential to the heart. There was right ventricular chamber collapse. Findings were suggestive of tamponade not physiology, though evaluation was limited by lack of mitral and tricuspid inflow velocities.   Past Medical History:  Diagnosis Date  . Breast cancer ([place])   . H/O degenerative disc disease   . Hyperlipidemia   . Hypothyroidism   . Morbid obesity ([place])       Most Recent Cardiac  Studies: TTE 03/19/2017:  Study Conclusions  - Left ventricle: The cavity size was normal. Wall thickness was   normal. Systolic function was normal. The estimated ejection    fraction was in the range of 55% to 65%. The study is not   technically sufficient to allow evaluation of LV diastolic   function. - Right ventricle: The cavity size was normal to small with early   diastolic collapse. Systolic function was normal. - Pericardium, extracardiac: A large pericardial effusion was   identified circumferential to the heart. There was right   ventricular chamber collapse. Findings are suggestive of   tamponade physiology, though evaluation is limited by lack of   mitral and tricuspid inflow velocities.   Surgical History: History reviewed. No pertinent surgical history.   Home Meds: Prior to Admission medications   Medication Sig Start Date End Date Taking? Authorizing Provider  alendronate (FOSAMAX) 70 MG tablet TAKE 1 TABLET BY MOUTH  EVERY WEEK 12/16/16  Yes Lloyd Huger, MD  aspirin EC 81 MG tablet Take 81 mg by mouth daily.    Yes [provider]  Cholecalciferol (VITAMIN D3) 2000 UNITS capsule Take 2,000 Units by mouth daily.    Yes [provider]  DULoxetine (CYMBALTA) 30 MG capsule 90 mg.  01/06/15  Yes [provider]  fluticasone (FLOVENT HFA) 110 MCG/ACT inhaler Inhale 1 puff into the lungs 2 (two) times daily.   Yes [provider]  levothyroxine (SYNTHROID, LEVOTHROID) 137 MCG tablet Take 137 mcg by mouth daily before breakfast.  07/19/15  Yes [provider]  metoprolol succinate (TOPROL-XL) 25 MG 24 hr tablet Take 75 mg by mouth daily.  11/02/14  Yes [provider]  topiramate (TOPAMAX) 50 MG tablet Take 50 mg by mouth daily.  11/19/14  Yes [provider]  azelastine (ASTELIN) 0.1 % nasal spray instill 1 spray into each nostril twice a day for 7 days then once daily if needed 01/18/15   [provider]  cyclobenzaprine (FLEXERIL) 10 MG tablet Take 10 mg by mouth 3 (three) times daily as needed.  11/19/14   [provider]  traMADol (ULTRAM) 50 MG tablet Take 25-50 mg by  mouth every 6 (six) hours as needed.     [provider]    Inpatient Medications:  . aspirin EC  81 mg Oral Daily  . budesonide (PULMICORT) nebulizer solution  0.25 mg Nebulization BID  . colchicine  0.6 mg Oral BID  . docusate sodium  100 mg Oral BID  . DULoxetine  90 mg Oral Daily  . guaiFENesin  600 mg Oral BID  . levalbuterol  0.63 mg Nebulization Q6H  . levothyroxine  137 mcg Oral QAC breakfast  . pantoprazole (PROTONIX) IV  40 mg Intravenous Q24H  . sodium chloride flush  3 mL Intravenous Q12H  . sodium chloride flush  3 mL Intravenous Q12H  . tiotropium  18 mcg Inhalation Daily  . topiramate  50 mg Oral Daily   . sodium chloride    . sodium chloride    . sodium chloride    . 0.9 % NaCl with KCl 20 mEq / L 75 mL/hr at 03/19/17 0650  . azithromycin    . cefTRIAXone (ROCEPHIN)  IV      Allergies:  Allergies  Allergen Reactions  . Codeine Other (See Comments)    constipation  . Sulfa Antibiotics Other (See Comments)    Allergy as a child    Social History  Social History  . Marital status: Married    Spouse name: N/A  . Number of children: N/A  . Years of education: N/A   Occupational History  . Not on file.   Social History Main Topics  . Smoking status: Never Smoker  . Smokeless tobacco: Never Used  . Alcohol use No  . Drug use: No  . Sexual activity: Not on file   Other Topics Concern  . Not on file   Social History Narrative  . No narrative on file     Family History  Problem Relation Age of Onset  . Colon polyps Father      Review of Systems: Review of Systems  Constitutional: Positive for malaise/fatigue. Negative for chills, diaphoresis, fever and weight loss.  HENT: Negative for congestion.   Eyes: Negative for discharge and redness.  Respiratory: Positive for shortness of breath. Negative for cough, hemoptysis, sputum production and wheezing.   Cardiovascular: Positive for chest pain and orthopnea. Negative for  palpitations, claudication, leg swelling and PND.  Gastrointestinal: Negative for abdominal pain, blood in stool, heartburn, melena, nausea and vomiting.  Genitourinary: Negative for hematuria.  Musculoskeletal: Positive for back pain and joint pain. Negative for falls and myalgias.  Skin: Negative for rash.  Neurological: Positive for weakness. Negative for dizziness, tingling, tremors, sensory change, speech change, focal weakness and loss of consciousness.  Endo/Heme/Allergies: Does not bruise/bleed easily.  Psychiatric/Behavioral: Negative for substance abuse. The patient is not nervous/anxious.   All other systems reviewed and are negative.   Labs:  Recent Labs  03/18/17 1449 03/18/17 2250 03/19/17 0445  TROPONINI <0.03 0.07* 0.08*   Lab Results  Component Value Date   WBC 10.9 03/19/2017   HGB 12.4 03/19/2017   HCT 36.7 03/19/2017   MCV 94.5 03/19/2017   PLT 238 03/19/2017     Recent Labs Lab 03/18/17 1449 03/19/17 0445  NA 136 138  K 3.8 3.7  CL 104 109  CO2 21* 20*  BUN 18 16  CREATININE 0.96 0.71  CALCIUM 9.2 8.2*  PROT 7.3  --   BILITOT 1.2  --   ALKPHOS 72  --   ALT 60*  --   AST 46*  --   GLUCOSE 148* 144*   No results found for: CHOL, HDL, LDLCALC, TRIG No results found for: DDIMER  Radiology/Studies:  Ct Angio Chest Pe W Or Wo Contrast  Result Date: 03/18/2017 IMPRESSION: 1. Large pericardial effusion.  Cardiology assessment recommended. 2. Small bilateral pleural effusions with atelectasis and ground-glass opacities likely representing stigmata of CHF. 3. No acute pulmonary embolus. 4. Bilateral skin thickening and subcutaneous fatty induration of the left breast with underlying bilateral saline subglandular implants. Clinical correlation recommended. Aortic Atherosclerosis (ICD10-I70.0). Electronically Signed   By: Ashley Royalty M.D.   On: 03/18/2017 20:09   Dg Chest Port 1 View  Result Date: 03/18/2017 IMPRESSION: Increased density over the left  base likely representing a combination of effusion and infiltrate. Two-view chest is recommended when the patient's condition allows. Electronically Signed   By: Inez Catalina M.D.   On: 03/18/2017 15:07    EKG: Interpreted by me showed: SVT, 147 bpm, low voltage QRS, nonspecific ST-T changes Telemetry: Interpreted by me showed: Currently in sinus tachycardia with heart rates in the 120s bpm. Episodes of atrial tachycardia versus SVT into the 150s upon admission.  Weights: Filed Weights   03/18/17 1429 03/18/17 2300  Weight: 220 lb (99.8 kg) 234 lb 12.6 oz (106.5 kg)  Physical Exam: Blood pressure (!) 114/95, pulse (!) 119, temperature 98 F (36.7 C), temperature source Oral, resp. rate (!) 25, height 5\' 3"  (1.6 m), weight 234 lb 12.6 oz (106.5 kg), SpO2 96 %. Body mass index is 41.59 kg/m. General: Well developed, well nourished, in no acute distress. Head: Normocephalic, atraumatic, sclera non-icteric, no xanthomas, nares are without discharge.  Neck: Negative for carotid bruits. JVD not elevated. Lungs: Clear bilaterally to auscultation without wheezes, rales, or rhonchi. Breathing is unlabored. Heart: RRR with S1 S2. Distant heart sounds. No murmurs, rubs, or gallops appreciated. Pulses paradoxus difficult to auscultate. Abdomen: Soft, non-tender, non-distended with normoactive bowel sounds. No hepatomegaly. No rebound/guarding. No obvious abdominal masses. Msk:  Strength and tone appear normal for age. Extremities: No clubbing or cyanosis. No edema. Distal pedal pulses are 2+ and equal bilaterally. Neuro: Alert and oriented X 3. No facial asymmetry. No focal deficit. Moves all extremities spontaneously. Psych:  Responds to questions appropriately with a normal affect.    Assessment and Plan:  Principal Problem:   Pericardial effusion Active Problems:   Acute dyspnea   SVT (supraventricular tachycardia) (HCC)   Lactic acidosis   Leukocytosis    1. Pericardial effusion  with findings suggestive of possible tamponade physiology: -Currently hemodynamically stable -Plan for pericardiocentesis this morning with Dr. Saunders Revel -Risks and benefits of procedure have been discussed with patient in detail by Dr. Saunders Revel -Will likely need to leave pericardial drain in place until less than 50 cc per day is draining -Will send pericardial fluid off for laboratory and pathology analysis -Cannot rule out malignancy at this time -Continue ASA  2. Atrial tachycardia versus PSVT: -Likely in the setting of #1 -Improved -Replete potassium to goal 4.0 -Check magnesium with recommendation to replete to goal 2.0 -TSH normal  3. Remaining per IM   Signed, Christell Faith, PA-C [facility] Pager: [phone] 03/19/2017, 9:31 AM

## 2017-03-19 NOTE — Progress Notes (Signed)
Per Dr End please notify him if heart rate is 150. He is aware that that it is 120-130's. He is aware of troponin 0.35. No new orders.

## 2017-03-19 NOTE — Progress Notes (Signed)
Dr. Saunders Revel at bedsideP: made aware of HR 140-150"s still post procedure. New orders received. Portable CxR done now at bedside. Labs drawn by lab tech. For PT & TROPONIN. Pt. Tolerated well. Drainage to pericardiocentesis site draining bright red blood. HOB up 45 degrees.

## 2017-03-19 NOTE — Progress Notes (Signed)
Pt. UOP 200 ml via external female cather:amber, clear urine. Pt. tx to ICU now , on monitor.Stable for tx. Report called to ICU RN.

## 2017-03-19 NOTE — Progress Notes (Signed)
Spoke with Dr End regarding patient morn medications. He stated that patient can take them with water.

## 2017-03-19 NOTE — Progress Notes (Signed)
Lovenox changed to 40 mg BID for BMI >40 and CrCl >30. 

## 2017-03-19 NOTE — Progress Notes (Signed)
Spoke with Jocelyn Lamer in special regarding patient having one IV as the other two were leaking and removed. During rounds patient needs to have a second IV for emergent access if needed per Dr Mortimer Fries. Benjamine Sprague that we are awaiting for PICC team. They start at 11:00am. We are unable to get an IV successfully. Dr Mortimer Fries did state that someone in special could try to place one as well. Jocelyn Lamer is aware and states that they will place IV.

## 2017-03-19 NOTE — Progress Notes (Signed)
[place] [facility] [place] NAME: Julie Irwin    MR#:  [id number]  DATE OF BIRTH:  Apr 23, 1956  SUBJECTIVE:  Still short of breath this morning Going for Pericardiocentesis today  REVIEW OF SYSTEMS:    Review of Systems  Constitutional: Negative for fever, chills weight loss HENT: Negative for ear pain, nosebleeds, congestion, facial swelling, rhinorrhea, neck pain, neck stiffness and ear discharge.   Respiratory: Positive shortness of breath no wheezing or cough Cardiovascular: Negative for chest pain, palpitations and leg swelling.  Gastrointestinal: Negative for heartburn, abdominal pain, vomiting, diarrhea or consitpation Genitourinary: Negative for dysuria, urgency, frequency, hematuria Musculoskeletal: Negative for back pain or joint pain Neurological: Negative for dizziness, seizures, syncope, focal weakness,  numbness and headaches.  Hematological: Does not bruise/bleed easily.  Psychiatric/Behavioral: Negative for hallucinations, confusion, dysphoric mood    Tolerating Diet:npo      DRUG ALLERGIES:   Allergies  Allergen Reactions  . Codeine Other (See Comments)    constipation  . Sulfa Antibiotics Other (See Comments)    Allergy as a child    VITALS:  Blood pressure 109/87, pulse (!) 129, temperature 98 F (36.7 C), temperature source Oral, resp. rate (!) 30, height 5\' 3"  (1.6 m), weight 106.5 kg (234 lb 12.6 oz), SpO2 90 %.  PHYSICAL EXAMINATION:  Constitutional: Appears well-developed and well-nourished. No distress. HENT: Normocephalic. Marland Kitchen Oropharynx is clear and moist.  Eyes: Conjunctivae and EOM are normal. PERRLA, no scleral icterus.  Neck: Normal ROM. Neck supple. No JVD. No tracheal deviation. CVS: Tachycardia S1/S2 +, no murmurs, no gallops, no carotid bruit.  Pulmonary: Effort and breath sounds normal, no stridor, rhonchi, wheezes, rales.  Abdominal: Soft. BS +,  no distension, tenderness, rebound or guarding.   Musculoskeletal: Normal range of motion. No edema and no tenderness.  Neuro: Alert. CN 2-12 grossly intact. No focal deficits. Skin: Skin is warm and dry. No rash noted. Psychiatric: Normal mood and affect.      LABORATORY PANEL:   CBC  Recent Labs Lab 03/19/17 0445  WBC 10.9  HGB 12.4  HCT 36.7  PLT 238   ------------------------------------------------------------------------------------------------------------------  Chemistries   Recent Labs Lab 03/18/17 1449 03/19/17 0445  NA 136 138  K 3.8 3.7  CL 104 109  CO2 21* 20*  GLUCOSE 148* 144*  BUN 18 16  CREATININE 0.96 0.71  CALCIUM 9.2 8.2*  AST 46*  --   ALT 60*  --   ALKPHOS 72  --   BILITOT 1.2  --    ------------------------------------------------------------------------------------------------------------------  Cardiac Enzymes  Recent Labs Lab 03/18/17 1449 03/18/17 2250 03/19/17 0445  TROPONINI <0.03 0.07* 0.08*   ------------------------------------------------------------------------------------------------------------------  RADIOLOGY:  Ct Angio Chest Pe W Or Wo Contrast  Result Date: 03/18/2017 CLINICAL DATA:  Tachycardia and dyspnea. EXAM: CT ANGIOGRAPHY CHEST WITH CONTRAST TECHNIQUE: Multidetector CT imaging of the chest was performed using the standard protocol during bolus administration of intravenous contrast. Multiplanar CT image reconstructions and MIPs were obtained to evaluate the vascular anatomy. CONTRAST:  75 cc Isovue 370 IV COMPARISON:  Same day CXR FINDINGS: Cardiovascular: Large pericardial effusion, anteriorly measuring up to 1.9 cm in thickness, 2.9 cm in thickness at the cardiac apex and posteriorly at 2.2 cm in thickness. The heart is otherwise normal in size. There is good opacification of the pulmonary arteries. No pulmonary embolus is identified. No aortic aneurysm or dissection. Minimal aortic atherosclerosis. Mediastinum/Nodes: No enlarged mediastinal, hilar, or  axillary lymph nodes. Thyroid gland, trachea,  and esophagus demonstrate no significant findings. Lungs/Pleura: Bilateral small to moderate pleural effusions with adjacent atelectasis. No dominant mass. Faint bilateral ground-glass opacities may represent stigmata of pulmonary edema or hypoventilatory change among some possibilities though not exclusive. Upper Abdomen: Bilateral saline subglandular implants with mild bilateral skin thickening and soft tissue induration about the left breast. Clinical correlation recommended. Musculoskeletal: Dextroscoliosis of the thoracic spine without acute nor suspicious osseous abnormality. Review of the MIP images confirms the above findings. IMPRESSION: 1. Large pericardial effusion.  Cardiology assessment recommended. 2. Small bilateral pleural effusions with atelectasis and ground-glass opacities likely representing stigmata of CHF. 3. No acute pulmonary embolus. 4. Bilateral skin thickening and subcutaneous fatty induration of the left breast with underlying bilateral saline subglandular implants. Clinical correlation recommended. Aortic Atherosclerosis (ICD10-I70.0). Electronically Signed   By: Ashley Royalty M.D.   On: 03/18/2017 20:09   Dg Chest Port 1 View  Result Date: 03/18/2017 CLINICAL DATA:  Tachycardia and pain EXAM: PORTABLE CHEST 1 VIEW COMPARISON:  None. FINDINGS: Cardiac shadow is enlarged. Increased density is noted over the left base in part related to overlying soft tissue although some effusion and infiltrate is likely present as well. No acute bony abnormality is seen. IMPRESSION: Increased density over the left base likely representing a combination of effusion and infiltrate. Two-view chest is recommended when the patient's condition allows. Electronically Signed   By: Inez Catalina M.D.   On: 03/18/2017 15:07     ASSESSMENT AND PLAN:   61 year old female with history of right breast cancer status post bilateral mastectomy in 2012 and hypothyroidism  who presented with shortness of breath and found to have large pericardial effusion with signs of tamponade and SVT.  1. Large pericardial effusion with tamponade: Patient is scheduled for pericardiocentesis this morning Follow-up on fluid and pathology results  2. SVT in the setting of problem #1 Appreciate cardiac consult Tachycardia should resolve after peric ardiocentesis  3. Community-acquired pneumonia: Continue azithromycin and Rocephin   4. Hypothyroid: Continue Synthroid 5 depression: Continue Cymbalta  6. History of gout: Continue close machine 7. Elevated troponin due to demand ischemia from large pericolic effusion. Patient has ruled out for ACS.  Management plans discussed with the patient and she is in agreement.  CODE STATUS: full  TOTAL TIME TAKING CARE OF THIS PATIENT: 30 minutes.     POSSIBLE D/C 2-4 days, DEPENDING ON CLINICAL CONDITION.   Julie Irwin M.D on 03/19/2017 at 10:14 AM  Between 7am to 6pm - Pager - [phone] After 6pm go to [email or web address] - password [email or web address] [facility]  Office  [phone]  CC: Primary care physician; Adin Hector, MD  Note: This dictation was prepared with Dragon dictation along with smaller phrase technology. Any transcriptional errors that result from this process are unintentional.

## 2017-03-19 NOTE — Interval H&P Note (Signed)
History and Physical Interval Note:  03/19/2017 9:55 AM  Julie Irwin  has presented today for surgery, with the diagnosis of pericardial effusion and cardiac tamponade. The various methods of treatment have been discussed with the patient and family. After consideration of risks, benefits and other options for treatment, the patient has consented to  Procedure(s): PERICARDIOCENTESIS (N/A) as a surgical intervention .  The patient's history has been reviewed, patient examined, no change in status, stable for surgery.  I have reviewed the patient's chart and labs.  Questions were answered to the patient's satisfaction.     Ciin Brazzel

## 2017-03-19 NOTE — Progress Notes (Signed)
*  PRELIMINARY RESULTS* Echocardiogram 2D Echocardiogram has been performed.  Julie Irwin 03/19/2017, 8:20 AM

## 2017-03-19 NOTE — Progress Notes (Signed)
Troponin of 0.07 reported to Hinton Dyer, NP

## 2017-03-19 NOTE — Progress Notes (Signed)
Pericardial drainage tube aspirated per Dr. Saunders Revel at bedside.

## 2017-03-20 ENCOUNTER — Inpatient Hospital Stay (HOSPITAL_COMMUNITY)
Admit: 2017-03-20 | Discharge: 2017-03-20 | Disposition: A | Discharge status: Home / Self Care | Payer: 59 | Attending: Internal Medicine | Admitting: Internal Medicine

## 2017-03-20 DIAGNOSIS — E872 Acidosis: Secondary | ICD-10-CM

## 2017-03-20 DIAGNOSIS — I319 Disease of pericardium, unspecified: Secondary | ICD-10-CM

## 2017-03-20 DIAGNOSIS — I314 Cardiac tamponade: Secondary | ICD-10-CM

## 2017-03-20 LAB — LD, BODY FLUID (OTHER): LD, BODY FLUID: 1284 IU/L

## 2017-03-20 LAB — GLUCOSE, CAPILLARY
GLUCOSE-CAPILLARY: 66 mg/dL (ref 65–99)
GLUCOSE-CAPILLARY: 70 mg/dL (ref 65–99)

## 2017-03-20 LAB — ECHOCARDIOGRAM LIMITED
HEIGHTINCHES: 63 in
WEIGHTICAEL: 3703.73 [oz_av]

## 2017-03-20 LAB — BASIC METABOLIC PANEL
ANION GAP: 6 (ref 5–15)
BUN: 18 mg/dL (ref 6–20)
CHLORIDE: 110 mmol/L (ref 101–111)
CO2: 24 mmol/L (ref 22–32)
Calcium: 8 mg/dL — ABNORMAL LOW (ref 8.9–10.3)
Creatinine, Ser: 0.76 mg/dL (ref 0.44–1.00)
GFR calc Af Amer: >60 mL/min (ref >60)
Glucose, Bld: 90 mg/dL (ref 65–99)
POTASSIUM: 3.4 mmol/L — AB (ref 3.5–5.1)
SODIUM: 140 mmol/L (ref 135–145)

## 2017-03-20 LAB — HIV ANTIBODY (ROUTINE TESTING W REFLEX): HIV Screen 4th Generation wRfx: NONREACTIVE

## 2017-03-20 LAB — MAGNESIUM: MAGNESIUM: 2.1 mg/dL (ref 1.7–2.4)

## 2017-03-20 LAB — EXPECTORATED SPUTUM ASSESSMENT W REFEX TO RESP CULTURE

## 2017-03-20 LAB — GLUCOSE, BODY FLUID OTHER: GLUCOSE, BODY FLUID OTHER: 87 mg/dL

## 2017-03-20 LAB — PROCALCITONIN

## 2017-03-20 LAB — PH, BODY FLUID: PH, BODY FLUID: 6.9

## 2017-03-20 MED ORDER — POTASSIUM CHLORIDE CRYS ER 20 MEQ PO TBCR
40.0000 meq | EXTENDED_RELEASE_TABLET | Freq: Once | ORAL | Status: AC
  Administered 2017-03-20: 40 meq via ORAL
  Filled 2017-03-20: qty 2

## 2017-03-20 NOTE — Progress Notes (Signed)
*  PRELIMINARY RESULTS* Echocardiogram 2D Echocardiogram has been performed.  Julie Irwin 03/20/2017, 8:54 AM

## 2017-03-20 NOTE — Progress Notes (Signed)
Patient Name: Julie Irwin Date of Encounter: 03/20/2017  Primary Cardiologist: New to [facility] - consult by End  Hospital Problem List     Principal Problem:   Pericardial effusion Active Problems:   Acute dyspnea   SVT (supraventricular tachycardia) (HCC)   Lactic acidosis   Leukocytosis   Cardiac tamponade     Subjective   S/p pericardiocentesis on 8/7 with 750 mL of bloody fluid aspirated. Labs on pericardial fluid pending. Drain with 250 mL out over the past 24 hours. Heart rate improved. BP remains mildly soft in the 57S systolic.   Inpatient Medications    Scheduled Meds: . budesonide (PULMICORT) nebulizer solution  0.25 mg Nebulization BID  . colchicine  0.6 mg Oral BID  . docusate sodium  100 mg Oral BID  . DULoxetine  90 mg Oral Daily  . guaiFENesin  600 mg Oral BID  . levalbuterol  0.63 mg Nebulization Q6H  . levothyroxine  137 mcg Oral QAC breakfast  . metoprolol tartrate  25 mg Oral BID  . pantoprazole (PROTONIX) IV  40 mg Intravenous Q24H  . potassium chloride  40 mEq Oral Once  . tiotropium  18 mcg Inhalation Daily  . topiramate  50 mg Oral Daily   Continuous Infusions: . sodium chloride    . sodium chloride    . azithromycin 250 mg (03/19/17 1746)  . cefTRIAXone (ROCEPHIN)  IV 1 g (03/19/17 1654)  . sodium chloride     PRN Meds: sodium chloride, sodium chloride, acetaminophen **OR** acetaminophen, ALPRAZolam, cyclobenzaprine, levalbuterol, ondansetron **OR** ondansetron (ZOFRAN) IV, oxyCODONE, traMADol   Vital Signs    Vitals:   03/20/17 0300 03/20/17 0400 03/20/17 0500 03/20/17 0600  BP: 91/63 (!) 86/62 92/69 98/68   Pulse: 75 75 73 75  Resp: 13 12 12 15   Temp:      TempSrc:      SpO2: 99% 98% 98% 100%  Weight:   231 lb 7.7 oz (105 kg)   Height:        Intake/Output Summary (Last 24 hours) at 03/20/17 0729 Last data filed at 03/20/17 0600  Gross per 24 hour  Intake              852 ml  Output             1565 ml  Net             -713  ml   Filed Weights   03/18/17 2300 03/19/17 1038 03/20/17 0500  Weight: 234 lb 12.6 oz (106.5 kg) 234 lb (106.1 kg) 231 lb 7.7 oz (105 kg)    Physical Exam    GEN: Well nourished, well developed, in no acute distress.  HEENT: Grossly normal.  Neck: Supple, no JVD, carotid bruits, or masses. Cardiac: RRR, no murmurs, rubs, or gallops. No clubbing, cyanosis, edema.  Radials/DP/PT 2+ and equal bilaterally. Pericardial drain with small amount of bloody fluid in drain and bag. No surrounding erythema at drain site.  Respiratory:  Respirations regular and unlabored, clear to auscultation bilaterally. GI: Soft, nontender, nondistended, BS + x 4. MS: no deformity or atrophy. Skin: warm and dry, no rash. Neuro:  Strength and sensation are intact. Psych: AAOx3.  Normal affect.  Labs    CBC  Recent Labs  03/18/17 1555 03/19/17 0445  WBC 11.5* 10.9  NEUTROABS 9.3*  --   HGB 11.9* 12.4  HCT 35.2 36.7  MCV 93.5 94.5  PLT 219 177   Basic Metabolic Panel  Recent Labs  03/19/17 0445 03/19/17 1245 03/20/17 0548  NA 138  --  140  K 3.7  --  3.4*  CL 109  --  110  CO2 20*  --  24  GLUCOSE 144*  --  90  BUN 16  --  18  CREATININE 0.71  --  0.76  CALCIUM 8.2*  --  8.0*  MG  --  1.8  --    Liver Function Tests  Recent Labs  03/18/17 1449 03/19/17 1244  AST 46*  --   ALT 60*  --   ALKPHOS 72  --   BILITOT 1.2  --   PROT 7.3 6.5  ALBUMIN 3.9  --     Recent Labs  03/18/17 1449  LIPASE 20   Cardiac Enzymes  Recent Labs  03/18/17 2250 03/19/17 0445 03/19/17 1245  TROPONINI 0.07* 0.08* 0.35*   BNP Invalid input(s): POCBNP D-Dimer No results for input(s): DDIMER in the last 72 hours. Hemoglobin A1C No results for input(s): HGBA1C in the last 72 hours. Fasting Lipid Panel No results for input(s): CHOL, HDL, LDLCALC, TRIG, CHOLHDL, LDLDIRECT in the last 72 hours. Thyroid Function Tests  Recent Labs  03/18/17 1449  TSH 1.980    Telemetry    NSR, 80s  bpm - Personally Reviewed  ECG    n/a - Personally Reviewed  Radiology    Ct Angio Chest Pe W Or Wo Contrast  Result Date: 03/18/2017 IMPRESSION: 1. Large pericardial effusion.  Cardiology assessment recommended. 2. Small bilateral pleural effusions with atelectasis and ground-glass opacities likely representing stigmata of CHF. 3. No acute pulmonary embolus. 4. Bilateral skin thickening and subcutaneous fatty induration of the left breast with underlying bilateral saline subglandular implants. Clinical correlation recommended. Aortic Atherosclerosis (ICD10-I70.0). Electronically Signed   By: Ashley Royalty M.D.   On: 03/18/2017 20:09   Dg Chest Port 1 View  Result Date: 03/19/2017 IMPRESSION: 1. No pneumothorax. 2. Stable small left and trace right pleural effusions and left greater than right bibasilar lung opacities. 3. Pericardial drain terminates over the right heart. Mildly enlarged cardiopericardial silhouette, slightly decreased. Electronically Signed   By: Ilona Sorrel M.D.   On: 03/19/2017 13:18   Dg Chest Port 1 View  Result Date: 03/18/2017 IMPRESSION: Increased density over the left base likely representing a combination of effusion and infiltrate. Two-view chest is recommended when the patient's condition allows. Electronically Signed   By: Inez Catalina M.D.   On: 03/18/2017 15:07    Cardiac Studies   Pericardiocentesis 03/19/17: Conclusion   Conclusions: 1. Successful pericardiocentesis and tube pericardiostomy yielding 700 mL of bloody fluid (opening pressure 12 mmHg, closing pressure 0 mmHg).  Recommendations: 1. Follow-up fluid analyses. 2. Obtain STAT portable CXR when patient returns to room to confirm tube placement and exclude pneumothorax. 3. Obtain limited echo tomorrow morning to reevaluate fluid. 4. Keep pericardial drain to gravity. It should be aspirated (not flushed) every 4 hours and output recorded. 5. Begin metoprolol tartrate 25 mg BID for tachycardia; if  persists, consider IV amiodarone. 6. Avoid anticoagulation given bloody pericardial effusion.    TTE 03/19/17: Study Conclusions  - Left ventricle: The cavity size was normal. Wall thickness was   normal. Systolic function was normal. The estimated ejection   fraction was in the range of 55% to 65%. The study is not   technically sufficient to allow evaluation of LV diastolic   function. - Right ventricle: The cavity size was normal to small with early  diastolic collapse. Systolic function was normal. - Pericardium, extracardiac: A large pericardial effusion was   identified circumferential to the heart. There was right   ventricular chamber collapse. Findings are suggestive of   tamponade physiology, though evaluation is limited by lack of   mitral and tricuspid inflow velocities.  Patient Profile     61 y.o. female with history of right-sided breast cancer status post bilateral mastectomy in 2012 status post letrozole and Herceptin status post reconstructive surgery of bilateral implants, hypothyroidism, degenerative disc disease, morbid obesity, hyperlipidemia, anxiety, and depression who presented to [facility] on 03/18/2017 with complaints of cough, shortness of breath, and discomfort in the chest radiating to the neck with associated fatigue and weakness. She was found to have a large pericardial effusion s/p pericardiocentesis on 8/7.  Assessment & Plan    1. Pericardial effusion with findings suggestive of possible tamponade physiology: -S/p pericardiocentesis on 8/7 with 750 mL of bloody fluid aspirated -Await cytology and labs on pericardial fluid -Concern for possible malignancy, this was discussed with the patient -Currently hemodynamically stable -Keep pericardial drain in place until less than 50 mL per day is draining, though would not want to keep in place for > 48 hours. If at that time she continues to have > 50 mL draining would consider surgical consult for possible  pericardial window -Consider empiric ABX -Give bloody fluid, would want to avoid anticoagulation if possible  2. Elevated troponin: -Echo as above -Likely supply demand ischemia  -Consider outpatient ischemic evaluation once #1 is improved -May need to hold beta blocker given soft BP  3. Atrial tachycardia versus PSVT: -Remains in sinus rhythm  -Likely in the setting of #1 -Improved -Replete potassium to goal 4.0 -Check magnesium with recommendation to replete to goal 2.0 -TSH normal  4. Remaining per IM  Signed, Christell Faith, PA-C [facility] Pager: [phone] 03/20/2017, 7:29 AM

## 2017-03-20 NOTE — Progress Notes (Addendum)
[place] at [place] NAME: Julie Irwin    MR#:  [id number]  DATE OF BIRTH:  [birth year]  SUBJECTIVE:  Patient underwent. periCardiocentesis yesterday Shortness of breath is improved.  REVIEW OF SYSTEMS:    Review of Systems  Constitutional: Negative for fever, chills weight loss HENT: Negative for ear pain, nosebleeds, congestion, facial swelling, rhinorrhea, neck pain, neck stiffness and ear discharge.   Respiratory: no shortness of breath no wheezing or cough Cardiovascular: Negative for chest pain, palpitations and leg swelling.  Gastrointestinal: Negative for heartburn, abdominal pain, vomiting, diarrhea or consitpation Genitourinary: Negative for dysuria, urgency, frequency, hematuria Musculoskeletal: Negative for back pain or joint pain Neurological: Negative for dizziness, seizures, syncope, focal weakness,  numbness and headaches.  Hematological: Does not bruise/bleed easily.  Psychiatric/Behavioral: Negative for hallucinations, confusion, dysphoric mood    Tolerating Diet: yes     DRUG ALLERGIES:   Allergies  Allergen Reactions  . Codeine Other (See Comments)    constipation  . Sulfa Antibiotics Other (See Comments)    Allergy as a child    VITALS:  Blood pressure 98/68, pulse 75, temperature 97.9 F (36.6 C), temperature source Oral, resp. rate 15, height 5\' 3"  (1.6 m), weight 105 kg (231 lb 7.7 oz), SpO2 100 %.  PHYSICAL EXAMINATION:  Constitutional: Appears well-developed and well-nourished. No distress. HENT: Normocephalic. Marland Kitchen Oropharynx is clear and moist.  Eyes: Conjunctivae and EOM are normal. PERRLA, no scleral icterus.  Neck: Normal ROM. Neck supple. No JVD. No tracheal deviation. CVS: Tachycardia S1/S2 +, no murmurs, no gallops, no carotid bruit.  Pulmonary: Effort and breath sounds normal, no stridor, rhonchi, ++scattered wheezes, rales.  Abdominal: Soft. BS +,  no distension, tenderness, rebound or  guarding.  Musculoskeletal: Normal range of motion. No edema and no tenderness.  Neuro: Alert. CN 2-12 grossly intact. No focal deficits. Skin: Skin is warm and dry. No rash noted.  + percardial drain placed  Psychiatric: Normal mood and affect.      LABORATORY PANEL:   CBC  Recent Labs Lab 03/19/17 0445  WBC 10.9  HGB 12.4  HCT 36.7  PLT 238   ------------------------------------------------------------------------------------------------------------------  Chemistries   Recent Labs Lab 03/18/17 1449  03/19/17 1245 03/20/17 0548  NA 136  < >  --  140  K 3.8  < >  --  3.4*  CL 104  < >  --  110  CO2 21*  < >  --  24  GLUCOSE 148*  < >  --  90  BUN 18  < >  --  18  CREATININE 0.96  < >  --  0.76  CALCIUM 9.2  < >  --  8.0*  MG  --   --  1.8  --   AST 46*  --   --   --   ALT 60*  --   --   --   ALKPHOS 72  --   --   --   BILITOT 1.2  --   --   --   < > = values in this interval not displayed. ------------------------------------------------------------------------------------------------------------------  Cardiac Enzymes  Recent Labs Lab 03/18/17 2250 03/19/17 0445 03/19/17 1245  TROPONINI 0.07* 0.08* 0.35*   ------------------------------------------------------------------------------------------------------------------  RADIOLOGY:  Ct Angio Chest Pe W Or Wo Contrast  Result Date: 03/18/2017 CLINICAL DATA:  Tachycardia and dyspnea. EXAM: CT ANGIOGRAPHY CHEST WITH CONTRAST TECHNIQUE: Multidetector CT imaging of the chest was performed using the standard protocol during  bolus administration of intravenous contrast. Multiplanar CT image reconstructions and MIPs were obtained to evaluate the vascular anatomy. CONTRAST:  75 cc Isovue 370 IV COMPARISON:  Same day CXR FINDINGS: Cardiovascular: Large pericardial effusion, anteriorly measuring up to 1.9 cm in thickness, 2.9 cm in thickness at the cardiac apex and posteriorly at 2.2 cm in thickness. The heart is  otherwise normal in size. There is good opacification of the pulmonary arteries. No pulmonary embolus is identified. No aortic aneurysm or dissection. Minimal aortic atherosclerosis. Mediastinum/Nodes: No enlarged mediastinal, hilar, or axillary lymph nodes. Thyroid gland, trachea, and esophagus demonstrate no significant findings. Lungs/Pleura: Bilateral small to moderate pleural effusions with adjacent atelectasis. No dominant mass. Faint bilateral ground-glass opacities may represent stigmata of pulmonary edema or hypoventilatory change among some possibilities though not exclusive. Upper Abdomen: Bilateral saline subglandular implants with mild bilateral skin thickening and soft tissue induration about the left breast. Clinical correlation recommended. Musculoskeletal: Dextroscoliosis of the thoracic spine without acute nor suspicious osseous abnormality. Review of the MIP images confirms the above findings. IMPRESSION: 1. Large pericardial effusion.  Cardiology assessment recommended. 2. Small bilateral pleural effusions with atelectasis and ground-glass opacities likely representing stigmata of CHF. 3. No acute pulmonary embolus. 4. Bilateral skin thickening and subcutaneous fatty induration of the left breast with underlying bilateral saline subglandular implants. Clinical correlation recommended. Aortic Atherosclerosis (ICD10-I70.0). Electronically Signed   By: Ashley Royalty M.D.   On: 03/18/2017 20:09   Dg Chest Port 1 View  Result Date: 03/19/2017 CLINICAL DATA:  Chest pain EXAM: PORTABLE CHEST 1 VIEW COMPARISON:  03/18/2017 chest radiograph. FINDINGS: Pericardial drain terminates over the right heart. Mildly enlarged cardiopericardial silhouette, slightly decreased in size. Otherwise stable mediastinal contour. No pneumothorax. Stable trace right and small left pleural effusions. No overt pulmonary edema. Patchy bibasilar lung opacities, left greater than right, stable. IMPRESSION: 1. No pneumothorax. 2.  Stable small left and trace right pleural effusions and left greater than right bibasilar lung opacities. 3. Pericardial drain terminates over the right heart. Mildly enlarged cardiopericardial silhouette, slightly decreased. Electronically Signed   By: Ilona Sorrel M.D.   On: 03/19/2017 13:18   Dg Chest Port 1 View  Result Date: 03/18/2017 CLINICAL DATA:  Tachycardia and pain EXAM: PORTABLE CHEST 1 VIEW COMPARISON:  None. FINDINGS: Cardiac shadow is enlarged. Increased density is noted over the left base in part related to overlying soft tissue although some effusion and infiltrate is likely present as well. No acute bony abnormality is seen. IMPRESSION: Increased density over the left base likely representing a combination of effusion and infiltrate. Two-view chest is recommended when the patient's condition allows. Electronically Signed   By: Inez Catalina M.D.   On: 03/18/2017 15:07     ASSESSMENT AND PLAN:   61 year old female with history of right breast cancer status post bilateral mastectomy in 2012 and hypothyroidism who presented with shortness of breath and found to have large pericardial effusion with signs of tamponade and SVT.  1. Large pericardial effusion with tamponade Physiology: Patient is s/p pericardiocentesis with drain placed. Follow-up on fluid and pathology results Follow up on today's echocardiogram Appreciate cardiology consult May need pericardial window if patient continues to have > 50-60 cc/fluid  2. SVT in the setting of problem #1 This is improved after pericardiocentesis  3. Community-acquired pneumonia: Continue azithromycin and Rocephin   4. Hypothyroid: Continue Synthroid 5 depression: Continue Cymbalta  6. History of gout: Continue colchine 7. Elevated troponin due to demand ischemia from large pericardial effusion.  Patient has ruled out for ACS.  Management plans discussed with the patient and she is in agreement.  CODE STATUS: full  TOTAL TIME  TAKING CARE OF THIS PATIENT: 23 minutes.     POSSIBLE D/C 2-4 days, DEPENDING ON CLINICAL CONDITION.   Sybil Shrader M.D on 03/20/2017 at 10:20 AM  Between 7am to 6pm - Pager - [phone] After 6pm go to [email or web address] - password [email or web address] [facility]  Office  [phone]  CC: Primary care physician; Adin Hector, MD  Note: This dictation was prepared with Dragon dictation along with smaller phrase technology. Any transcriptional errors that result from this process are unintentional.

## 2017-03-20 NOTE — Consult Note (Signed)
   Name: Julie Irwin MRN: [id number] DOB: 07-01-1956    ADMISSION DATE:  03/18/2017 CONSULTATION DATE: 03/18/2017  REFERRING MD : Dr. Ether Griffins   CHIEF COMPLAINT: Shortness of Breath  BRIEF PATIENT DESCRIPTION:  61 yo female admitted with acute respiratory failure and SVT CT Angio Chest revealing a large pericardial effusion   SIGNIFICANT EVENTS  08/6-Pt admitted to the [facility]  8/7 pericardial drain placed 8/8 250 CC output from drain  STUDIES:  CT Angio Chest 08/6>>Large pericardial effusion.  Cardiology assessment recommended. Small bilateral pleural effusions with atelectasis and ground-glass opacities likely representing stigmata of CHF. No acute pulmonary embolus. Bilateral skin thickening and subcutaneous fatty induration of the left breast with underlying bilateral saline subglandular implants. Clinical correlation recommended. Aortic Atherosclerosis  HISTORY OF PRESENT ILLNESS:   Alert and awake Less distressed today Discussed concerns of hemorrhagic effusion could be related to previous malignancy Patient understands Pathology pending   REVIEW OF SYSTEMS: Positives in BOLD  Constitutional: fever, chills, weight loss, malaise/fatigue and diaphoresis.  HENT: Negative for hearing loss, ear pain, nosebleeds, congestion, sore throat, neck pain, tinnitus and ear discharge.   Eyes: Negative for blurred vision, double vision, photophobia, pain, discharge and redness.  Respiratory: cough, hemoptysis, sputum production, shortness of breath, wheezing and stridor.   Cardiovascular: chest discomfort, palpitations, orthopnea, claudication, leg swelling and PND.  Gastrointestinal: Negative for heartburn, nausea, vomiting, abdominal pain, diarrhea, constipation, blood in stool      VITAL SIGNS: Temp:  [97.9 F (36.6 C)-98.5 F (36.9 C)] 97.9 F (36.6 C) (08/08 0200) Pulse Rate:  [65-153] 75 (08/08 0600) Resp:  [11-32] 15 (08/08 0600) BP: (85-140)/(59-95) 98/68 (08/08  0600) SpO2:  [90 %-100 %] 100 % (08/08 0600) FiO2 (%):  [21 %] 21 % (08/07 1951) Weight:  [105 kg (231 lb 7.7 oz)-106.1 kg (234 lb)] 105 kg (231 lb 7.7 oz) (08/08 0500)  PHYSICAL EXAMINATION: General: Well-developed, well-nourished Caucasian female, NAD Neuro: Alert and oriented, follows commands HEENT: Supple, no JVD Cardiovascular: Sinus tach, S1-S2, no M/R/G Lungs: Diminished throughout, even, nonlabored; no wheezes, rales, rhonchi, or crackles Abdomen: Hypoactive bowel sounds 4, epigastric tenderness, obese, soft, nondistended Musculoskeletal: Normal bulk and tone, no edema Skin: Intact no rashes or lesions   Recent Labs Lab 03/18/17 1449 03/19/17 0445 03/20/17 0548  NA 136 138 140  K 3.8 3.7 3.4*  CL 104 109 110  CO2 21* 20* 24  BUN 18 16 18   CREATININE 0.96 0.71 0.76  GLUCOSE 148* 144* 90    Recent Labs Lab 03/18/17 1555 03/19/17 0445  HGB 11.9* 12.4  HCT 35.2 36.7  WBC 11.5* 10.9  PLT 219 238     ASSESSMENT / PLAN: Acute hypoxic respiratory failure likely secondary to large pericardial effusion Incidental finding of large pericardial effusion (CT Angio Chest findings 03/18/17) Lactic acidosis Mild leukocytosis Anemia without blood loss P: Supplemental O2 to maintain O2 sats greater than 92% or for dyspnea Prn chest x-ray Cardiology consulted appreciate input-placed pericardial drain Continuous telemetry monitoring Follow cultures Continue empiric abx for now  Lovenox for DVT prophylaxis Transfuse for Hgb of less than 7 Await pathology results Await output from drain-if persistent, will need pericardial window  Patient  satisfied with Plan of action and management. All questions answered  Corrin Parker, M.D.  [facility]  Medical Director [place] Director [facility] Cardio-Pulmonary Department

## 2017-03-21 ENCOUNTER — Inpatient Hospital Stay: Payer: 59

## 2017-03-21 DIAGNOSIS — R748 Abnormal levels of other serum enzymes: Secondary | ICD-10-CM

## 2017-03-21 LAB — CBC
HEMATOCRIT: 37.1 % (ref 35.0–47.0)
HEMOGLOBIN: 12.4 g/dL (ref 12.0–16.0)
MCH: 31.4 pg (ref 26.0–34.0)
MCHC: 33.4 g/dL (ref 32.0–36.0)
MCV: 94.3 fL (ref 80.0–100.0)
Platelets: 239 10*3/uL (ref 150–440)
RBC: 3.93 MIL/uL (ref 3.80–5.20)
RDW: 13.2 % (ref 11.5–14.5)
WBC: 6.7 10*3/uL (ref 3.6–11.0)

## 2017-03-21 LAB — GLUCOSE, CAPILLARY
GLUCOSE-CAPILLARY: 142 mg/dL — AB (ref 65–99)
Glucose-Capillary: 80 mg/dL (ref 65–99)

## 2017-03-21 MED ORDER — BUDESONIDE 0.5 MG/2ML IN SUSP: 0.5000 mg | Freq: Two times a day (BID) | RESPIRATORY_TRACT | Status: DC

## 2017-03-21 MED ORDER — BUDESONIDE 0.5 MG/2ML IN SUSP
0.5000 mg | Freq: Two times a day (BID) | RESPIRATORY_TRACT | Status: DC
  Administered 2017-03-21 – 2017-03-24 (×6): 0.5 mg via RESPIRATORY_TRACT
  Filled 2017-03-21 (×6): qty 2

## 2017-03-21 MED ORDER — IPRATROPIUM-ALBUTEROL 0.5-2.5 (3) MG/3ML IN SOLN
3.0000 mL | Freq: Four times a day (QID) | RESPIRATORY_TRACT | Status: DC | PRN
  Filled 2017-03-21: qty 3

## 2017-03-21 MED ORDER — METHYLPREDNISOLONE SODIUM SUCC 40 MG IJ SOLR
40.0000 mg | Freq: Two times a day (BID) | INTRAMUSCULAR | Status: DC
  Administered 2017-03-21 – 2017-03-23 (×6): 40 mg via INTRAVENOUS
  Filled 2017-03-21 (×6): qty 1

## 2017-03-21 MED ORDER — FUROSEMIDE 10 MG/ML IJ SOLN
40.0000 mg | Freq: Once | INTRAMUSCULAR | Status: AC
  Administered 2017-03-21: 40 mg via INTRAVENOUS
  Filled 2017-03-21: qty 4

## 2017-03-21 MED ORDER — IPRATROPIUM-ALBUTEROL 0.5-2.5 (3) MG/3ML IN SOLN
3.0000 mL | RESPIRATORY_TRACT | Status: DC
  Administered 2017-03-21 – 2017-03-23 (×12): 3 mL via RESPIRATORY_TRACT
  Filled 2017-03-21 (×12): qty 3

## 2017-03-21 MED ORDER — BISOPROLOL FUMARATE 5 MG PO TABS
5.0000 mg | ORAL_TABLET | Freq: Every day | ORAL | Status: DC
  Administered 2017-03-22: 5 mg via ORAL
  Filled 2017-03-21: qty 1

## 2017-03-21 MED ORDER — PIPERACILLIN-TAZOBACTAM 3.375 G IVPB
3.3750 g | Freq: Three times a day (TID) | INTRAVENOUS | Status: DC
  Administered 2017-03-21 – 2017-03-24 (×9): 3.375 g via INTRAVENOUS
  Filled 2017-03-21 (×9): qty 50

## 2017-03-21 MED ORDER — PIPERACILLIN-TAZOBACTAM 3.375 G IVPB
3.3750 g | Freq: Once | INTRAVENOUS | Status: AC
  Administered 2017-03-21: 3.375 g via INTRAVENOUS
  Filled 2017-03-21: qty 50

## 2017-03-21 NOTE — Progress Notes (Signed)
[facility] [place] NAME: Julie Irwin    MR#:  [id number]  DATE OF BIRTH:  1955-10-09  SUBJECTIVE:  Doing ok this am Sob improved Still has drain placed not much coming from drain  REVIEW OF SYSTEMS:    Review of Systems  Constitutional: Negative for fever, chills weight loss HENT: Negative for ear pain, nosebleeds, congestion, facial swelling, rhinorrhea, neck pain, neck stiffness and ear discharge.   Respiratory: no shortness of breath no wheezing or cough Cardiovascular: Negative for chest pain, palpitations and leg swelling.  Gastrointestinal: Negative for heartburn, abdominal pain, vomiting, diarrhea or consitpation Genitourinary: Negative for dysuria, urgency, frequency, hematuria Musculoskeletal: Negative for back pain or joint pain Neurological: Negative for dizziness, seizures, syncope, focal weakness,  numbness and headaches.  Hematological: Does not bruise/bleed easily.  Psychiatric/Behavioral: Negative for hallucinations, confusion, dysphoric mood    Tolerating Diet: yes     DRUG ALLERGIES:   Allergies  Allergen Reactions  . Codeine Other (See Comments)    constipation  . Sulfa Antibiotics Other (See Comments)    Allergy as a child    VITALS:  Blood pressure 101/77, pulse 84, temperature 98.7 F (37.1 C), temperature source Oral, resp. rate (!) 27, height 5\' 3"  (1.6 m), weight 108 kg (238 lb 1.6 oz), SpO2 100 %.  PHYSICAL EXAMINATION:  Constitutional: Appears well-developed and well-nourished. No distress. HENT: Normocephalic. Marland Kitchen Oropharynx is clear and moist.  Eyes: Conjunctivae and EOM are normal. PERRLA, no scleral icterus.  Neck: Normal ROM. Neck supple. No JVD. No tracheal deviation. CVS: Tachycardia S1/S2 +, no murmurs, no gallops, no carotid bruit.  Pulmonary: Effort and breath sounds normal, ++ scatted wheezing Abdominal: Soft. BS +,  no distension, tenderness, rebound or guarding.  Musculoskeletal:  Normal range of motion. No edema and no tenderness.  Neuro: Alert. CN 2-12 grossly intact. No focal deficits. Skin: Skin is warm and dry. No rash noted. Pericardia drain tube covered Psychiatric: Normal mood and affect.      LABORATORY PANEL:   CBC  Recent Labs Lab 03/21/17 0900  WBC 6.7  HGB 12.4  HCT 37.1  PLT 239   ------------------------------------------------------------------------------------------------------------------  Chemistries   Recent Labs Lab 03/18/17 1449  03/20/17 0548  NA 136  < > 140  K 3.8  < > 3.4*  CL 104  < > 110  CO2 21*  < > 24  GLUCOSE 148*  < > 90  BUN 18  < > 18  CREATININE 0.96  < > 0.76  CALCIUM 9.2  < > 8.0*  MG  --   < > 2.1  AST 46*  --   --   ALT 60*  --   --   ALKPHOS 72  --   --   BILITOT 1.2  --   --   < > = values in this interval not displayed. ------------------------------------------------------------------------------------------------------------------  Cardiac Enzymes  Recent Labs Lab 03/18/17 2250 03/19/17 0445 03/19/17 1245  TROPONINI 0.07* 0.08* 0.35*   ------------------------------------------------------------------------------------------------------------------  RADIOLOGY:  Dg Chest Port 1 View  Result Date: 03/21/2017 CLINICAL DATA:  61 year old female with shortness of Breath. Pericardiocentesis 2 days ago. Pericardial and pleural effusions. EXAM: PORTABLE CHEST 1 VIEW COMPARISON:  03/19/2017 and earlier. FINDINGS: Portable AP upright view at 0808 hours. Bilateral breast implants incidentally noted. Continued veiling opacity at the left lung base and indistinct appearance of the lower heart border. A drainage catheter continues to project over the cardiac silhouette and appears stable.  Small right pleural effusion appears stable. Decreased pulmonary vascularity. No pneumothorax. Other mediastinal contours are stable. Visualized tracheal air column is within normal limits. IMPRESSION: 1. Stable  pericardial catheter and cardiac size since 03/19/2017. 2. Decreased pulmonary vascularity, no acute edema. 3. Continued confluent opacity at the left lung base, probably a combination of pleural effusion and atelectasis or consolidation. 4. No new cardiopulmonary abnormality. Electronically Signed   By: Genevie Ann M.D.   On: 03/21/2017 08:48   Dg Chest Port 1 View  Result Date: 03/19/2017 CLINICAL DATA:  Chest pain EXAM: PORTABLE CHEST 1 VIEW COMPARISON:  03/18/2017 chest radiograph. FINDINGS: Pericardial drain terminates over the right heart. Mildly enlarged cardiopericardial silhouette, slightly decreased in size. Otherwise stable mediastinal contour. No pneumothorax. Stable trace right and small left pleural effusions. No overt pulmonary edema. Patchy bibasilar lung opacities, left greater than right, stable. IMPRESSION: 1. No pneumothorax. 2. Stable small left and trace right pleural effusions and left greater than right bibasilar lung opacities. 3. Pericardial drain terminates over the right heart. Mildly enlarged cardiopericardial silhouette, slightly decreased. Electronically Signed   By: Ilona Sorrel M.D.   On: 03/19/2017 13:18     ASSESSMENT AND PLAN:   61 year old female with history of right breast cancer status post bilateral mastectomy in 2012 and hypothyroidism who presented with shortness of breath and found to have large pericardial effusion with signs of tamponade physiology and SVT.  1. Large pericardial effusion with tamponade Physiology: Patient is s/p pericardiocentesis with drain placed. Follow-up on fluid and final cytology results Follow up ECHO shows resolution of circumferential pericardial fluid. There is echogenic material near the apex abutting the RV suspicious for intrapericardial thrombus or other organized material.  Appreciate cardiology consult Probably can remove drain today  2. SVT in the setting of problem #1 This has improved after pericardiocentesis Continue  metoprolol  3. Community-acquired pneumonia: Continue zosyn as per intensivist team 4. Hypothyroid: Continue Synthroid 5 depression: Continue Cymbalta  6. History of gout: Continue colchine 7. Elevated troponin due to demand ischemia from large pericardial effusion. Patient has ruled out for ACS.  Management plans discussed with the patient and she is in agreement.  CODE STATUS: full  TOTAL TIME TAKING CARE OF THIS PATIENT: 23 minutes.     POSSIBLE D/C 2 days, DEPENDING ON CLINICAL CONDITION.   Jayleigh Notarianni M.D on 03/21/2017 at 9:32 AM  Between 7am to 6pm - Pager - [phone] After 6pm go to [email or web address] - password [email or web address] [facility]  Office  [phone]  CC: Primary care physician; Adin Hector, MD  Note: This dictation was prepared with Dragon dictation along with smaller phrase technology. Any transcriptional errors that result from this process are unintentional.

## 2017-03-21 NOTE — Progress Notes (Signed)
Pharmacy Antibiotic Note  Julie Irwin is a 61 y.o. female admitted on 03/18/2017 with acute dyspnea.  Pharmacy has been consulted for Zosyn dosing. Patient has been on Ceftriaxone and Azithromycin since 8/6.  Plan: Zosyn 3.375g IV q8h (4 hour infusion).  Height: 5\' 3"  (160 cm) Weight: 238 lb 1.6 oz (108 kg) IBW/kg (Calculated) : 52.4  Temp (24hrs), Avg:98.5 F (36.9 C), Min:98.1 F (36.7 C), Max:98.7 F (37.1 C)   Recent Labs Lab 03/18/17 1449 03/18/17 1555 03/18/17 1644 03/18/17 2009 03/19/17 0445 03/20/17 0548 03/21/17 0900  WBC  --  11.5*  --   --  10.9  --  6.7  CREATININE 0.96  --   --   --  0.71 0.76  --   LATICACIDVEN  --   --  2.2* 2.1*  --   --   --     Estimated Creatinine Clearance: 87 mL/min (by C-G formula based on SCr of 0.76 mg/dL).    Allergies  Allergen Reactions  . Codeine Other (See Comments)    constipation  . Sulfa Antibiotics Other (See Comments)    Allergy as a child    Antimicrobials this admission: Ceftriaxone/Azithromycin 8/6 >> 8/9 Zosyn 8/9 >>    Thank you for allowing pharmacy to be a part of this patient's care.  Paulina Fusi, PharmD, BCPS 03/21/2017 9:37 AM

## 2017-03-21 NOTE — Consult Note (Signed)
Name: Julie Irwin MRN: [id number] DOB: Aug 22, 1955    ADMISSION DATE:  03/18/2017 CONSULTATION DATE: 03/18/2017  REFERRING MD : Dr. Ether Griffins   CHIEF COMPLAINT: Shortness of Breath  BRIEF PATIENT DESCRIPTION:  61 yo female admitted with acute respiratory failure and SVT CT Angio Chest revealing a large pericardial effusion   SIGNIFICANT EVENTS  08/6-Pt admitted to the [facility]  8/7 pericardial drain placed 8/8 250 CC output from drain  STUDIES:  CT Angio Chest 08/6>>Large pericardial effusion.  Cardiology assessment recommended. Small bilateral pleural effusions with atelectasis and ground-glass opacities likely representing stigmata of CHF. No acute pulmonary embolus. Bilateral skin thickening and subcutaneous fatty induration of the left breast with underlying bilateral saline subglandular implants. Clinical correlation recommended. Aortic Atherosclerosis  HISTORY OF PRESENT ILLNESS:   Increased WOB today +wheezing Will need cxr High risk for intubation Pathology pending   REVIEW OF SYSTEMS: +resp distress Constitutional: fever, chills, weight loss, malaise/fatigue and diaphoresis.  HENT: Negative for hearing loss, ear pain, nosebleeds, congestion, sore throat, neck pain, tinnitus and ear discharge.   Eyes: Negative for blurred vision, double vision, photophobia, pain, discharge and redness.  Respiratory: cough, hemoptysis, sputum production, +shortness of breath, +wheezing and stridor.   Cardiovascular: chest discomfort, palpitations, orthopnea, claudication, leg swelling and PND.  Gastrointestinal: Negative for heartburn, nausea, vomiting, abdominal pain, diarrhea, constipation, blood in stool      VITAL SIGNS: Temp:  [98 F (36.7 C)-98.7 F (37.1 C)] 98.7 F (37.1 C) (08/09 0500) Pulse Rate:  [76-103] 84 (08/09 0600) Resp:  [13-29] 27 (08/09 0600) BP: (85-133)/(44-109) 101/77 (08/09 0600) SpO2:  [91 %-100 %] 100 % (08/09 0729) Weight:  [238 lb 1.6 oz (108  kg)] 238 lb 1.6 oz (108 kg) (08/09 0500)  PHYSICAL EXAMINATION: General: +resp distress Neuro: Alert and oriented, follows commands HEENT: Supple, no JVD Cardiovascular: Sinus tach, S1-S2, no M/R/G drain in place Lungs: Diminished throughout, even, nonlabored; + wheezes, rales, +rhonchi, or crackles Abdomen: Hypoactive bowel sounds 4, epigastric tenderness, obese, soft, nondistended Musculoskeletal: Normal bulk and tone, no edema Skin: Intact no rashes or lesions   Recent Labs Lab 03/18/17 1449 03/19/17 0445 03/20/17 0548  NA 136 138 140  K 3.8 3.7 3.4*  CL 104 109 110  CO2 21* 20* 24  BUN 18 16 18   CREATININE 0.96 0.71 0.76  GLUCOSE 148* 144* 90    Recent Labs Lab 03/18/17 1555 03/19/17 0445  HGB 11.9* 12.4  HCT 35.2 36.7  WBC 11.5* 10.9  PLT 219 238     ASSESSMENT / PLAN: Acute hypoxic respiratory failure likely secondary to large pericardial effusion Patient at high risk for intubation at this time and is critically ill   Incidental finding of large pericardial effusion (CT Angio Chest findings 03/18/17) Lactic acidosis Mild leukocytosis Anemia without blood loss P: Supplemental O2 to maintain O2 sats greater than 92% or for dyspnea Check  chest x-ray Cardiology consulted appreciate input-placed pericardial drain dounebs avery 4 hrs pulmicort nebs start IV steroids Follow cultures Continue empiric abx for now  Lovenox for DVT prophylaxis Transfuse for Hgb of less than 7 Await pathology results Await output from drain-if persistent, will need pericardial window Dr Genevive Bi following   Critical Care Time devoted to patient care services described in this note is 34 minutes.   Overall, patient is critically ill, prognosis is guarded.  Patient with Multiorgan failure and at high risk for cardiac arrest and death.    Corrin Parker, M.D.  [facility]  Medical Director [place] Director [facility]  Cardio-Pulmonary Department

## 2017-03-21 NOTE — Progress Notes (Signed)
Progress Note  Patient Name: Julie Irwin Date of Encounter: 03/21/2017  Primary Cardiologist: Andree Coss, MD   Subjective   She's had more wheezing and dyspnea overnight.  Multiple breathing treatments.  Currently stable.  No drainage in chest tube.  Inpatient Medications    Scheduled Meds: . budesonide (PULMICORT) nebulizer solution  0.5 mg Nebulization BID  . colchicine  0.6 mg Oral BID  . docusate sodium  100 mg Oral BID  . DULoxetine  90 mg Oral Daily  . guaiFENesin  600 mg Oral BID  . ipratropium-albuterol  3 mL Nebulization Q4H  . levothyroxine  137 mcg Oral QAC breakfast  . methylPREDNISolone (SOLU-MEDROL) injection  40 mg Intravenous Q12H  . metoprolol tartrate  25 mg Oral BID  . pantoprazole (PROTONIX) IV  40 mg Intravenous Q24H  . topiramate  50 mg Oral Daily   Continuous Infusions: . sodium chloride    . sodium chloride    . piperacillin-tazobactam (ZOSYN)  IV 3.375 g (03/21/17 0951)  . piperacillin-tazobactam (ZOSYN)  IV    . sodium chloride     PRN Meds: sodium chloride, sodium chloride, acetaminophen **OR** acetaminophen, ALPRAZolam, cyclobenzaprine, ipratropium-albuterol, ondansetron **OR** ondansetron (ZOFRAN) IV, oxyCODONE, traMADol   Vital Signs     03/21/17 0800 03/21/17 0900 03/21/17 0949  BP: (!) 104/54 105/75 105/75  Pulse: 86 92 92  Resp: (!) 22 (!) 24   Temp: 97.7 F (36.5 C)    TempSrc: Oral    SpO2: 97% 99%   Weight:     Height:       Intake/Output Summary (Last 24 hours) at 03/21/17 1100 Last data filed at 03/21/17 0924  Gross per 24 hour  Intake              220 ml  Output             1085 ml  Net             -865 ml   Filed Weights   03/19/17 1038 03/20/17 0500 03/21/17 0500  Weight: 234 lb (106.1 kg) 231 lb 7.7 oz (105 kg) 238 lb 1.6 oz (108 kg)    Physical Exam   GEN: Well nourished, well developed, in no acute distress.  HEENT: Grossly normal.  Neck: Supple, no JVD, carotid bruits, or masses. Cardiac: RRR, distant, no  murmurs, rubs, or gallops. No clubbing, cyanosis, edema.  Radials/DP/PT 2+ and equal bilaterally.  Respiratory:  Respirations regular and unlabored, exp wheezing noted throughout. GI: Soft, nontender, nondistended, BS + x 4. MS: no deformity or atrophy. Skin: warm and dry, no rash. Neuro:  Strength and sensation are intact. Psych: AAOx3.  Normal affect.  Labs    Chemistry Recent Labs Lab 03/18/17 1449 03/19/17 0445 03/19/17 1244 03/20/17 0548  NA 136 138  --  140  K 3.8 3.7  --  3.4*  CL 104 109  --  110  CO2 21* 20*  --  24  GLUCOSE 148* 144*  --  90  BUN 18 16  --  18  CREATININE 0.96 0.71  --  0.76  CALCIUM 9.2 8.2*  --  8.0*  PROT 7.3  --  6.5  --   ALBUMIN 3.9  --   --   --   AST 46*  --   --   --   ALT 60*  --   --   --   ALKPHOS 72  --   --   --  BILITOT 1.2  --   --   --   GFRNONAA >60 >60  --  >60  GFRAA >60 >60  --  >60  ANIONGAP 11 9  --  6     Hematology Recent Labs Lab 03/18/17 1555 03/19/17 0445 03/21/17 0900  WBC 11.5* 10.9 6.7  RBC 3.77* 3.88 3.93  HGB 11.9* 12.4 12.4  HCT 35.2 36.7 37.1  MCV 93.5 94.5 94.3  MCH 31.7 32.0 31.4  MCHC 33.9 33.9 33.4  RDW 13.0 13.2 13.2  PLT 219 238 239    Cardiac Enzymes Recent Labs Lab 03/18/17 1449 03/18/17 2250 03/19/17 0445 03/19/17 1245  TROPONINI <0.03 0.07* 0.08* 0.35*      BNP Recent Labs Lab 03/18/17 1449  BNP 114.0*      Radiology    Dg Chest Port 1 View  Result Date: 03/21/2017 CLINICAL DATA:  61 year old female with shortness of Breath. Pericardiocentesis 2 days ago. Pericardial and pleural effusions. EXAM: PORTABLE CHEST 1 VIEW COMPARISON:  03/19/2017 and earlier. FINDINGS: Portable AP upright view at 0808 hours. Bilateral breast implants incidentally noted. Continued veiling opacity at the left lung base and indistinct appearance of the lower heart border. A drainage catheter continues to project over the cardiac silhouette and appears stable. Small right pleural effusion appears  stable. Decreased pulmonary vascularity. No pneumothorax. Other mediastinal contours are stable. Visualized tracheal air column is within normal limits. IMPRESSION: 1. Stable pericardial catheter and cardiac size since 03/19/2017. 2. Decreased pulmonary vascularity, no acute edema. 3. Continued confluent opacity at the left lung base, probably a combination of pleural effusion and atelectasis or consolidation. 4. No new cardiopulmonary abnormality. Electronically Signed   By: Genevie Ann M.D.   On: 03/21/2017 08:48    Telemetry    RSR, brief run of atrial tachycardia this am - Personally Reviewed  Cardiac Studies   TTE 03/19/17: Study Conclusions  - Left ventricle: The cavity size was normal. Wall thickness was normal. Systolic function was normal. The estimated ejection fraction was in the range of 55% to 65%. The study is not technically sufficient to allow evaluation of LV diastolic function. - Right ventricle: The cavity size was normal to small with early diastolic collapse. Systolic function was normal. - Pericardium, extracardiac: A large pericardial effusion was identified circumferential to the heart. There was right ventricular chamber collapse. Findings are suggestive of tamponade physiology, though evaluation is limited by lack of mitral and tricuspid inflow velocities. _____________  Pericardiocentesis 03/19/17: Conclusion   Conclusions: 1. Successful pericardiocentesis and tube pericardiostomy yielding 700 mL of bloody fluid (opening pressure 12 mmHg, closing pressure 0 mmHg).  _____________   Patient Profile     61 y.o. female with history of right-sided breast cancer status post bilateral mastectomy in 2012 status post letrozoleand Herceptin status post reconstructive surgery of bilateral implants, hypothyroidism, degenerative disc disease, morbid obesity, hyperlipidemia, anxiety, and depression who presented to [facility] on 03/18/2017 with complaints of  cough, shortness of breath, and discomfort in the chest radiating to the neck with associated fatigue and weakness. She was found to have a large pericardial effusion s/p pericardiocentesis on 8/7.  Assessment & Plan    1.  Pericardial Effusion w/ findings suggestive of possible tamponade physiology: s/p pericardiocentesis on 8/7 w/ 750 ml of bloody fluid aspirated.  Culture w/o growth to date.  Path pending. Concern for malignancy persists. Residual clot or other solid material noted in intrapericardial space per echo 8/8.  Only 15 ml of drainage since yesterday morning (  none since yesterday afternoon).  Stable cardiac sillhouette on cxr this am.  Rec d/c of pericardial drain - defer to thoracic surgery.  2.  Elevated troponin/demand ischemia:  In setting of above.  EF nl w/o wma on echo.  Likely represents demand ischemia.  No chest pain.  No plan for ischemic eval @ this time.  3.  SVT:  In setting of above.  Brief run of PAT this morning - asymptomatic.  Cont  blocker but switch to bisoprolol with ongoing wheezing.  4.  AECOPD:  Inhalers/nebs/steroids per IM.  She's been wheezing more this AM. Will switch  blocker to bisoprolol.  Signed, Murray Hodgkins, NP  03/21/2017, 11:00 AM

## 2017-03-21 NOTE — Progress Notes (Signed)
Patient ID: Julie Irwin, female   DOB: [birth year], 61 y.o.   MRN: [id number]  Chief Complaint  Patient presents with  . Tachycardia    Referred By Dr. Mortimer Fries Reason for Referral pericardial effusion  HPI Location, Quality, Duration, Severity, Timing, Context, Modifying Factors, Associated Signs and Symptoms.  Julie Irwin is a 61 y.o. female.  I have personally seen and examined this patient. Our dictation system was down yesterday when I saw the patient and therefore this note is being dictated on August 9 and the patient was seen on August 8. At the time I was asked to see the patient she had a pericardial drain in place which was draining only a small amount of serosanguineous fluid. The patient states that she was in her usual state of health until several days prior to admission when she began experiencing significant chest discomfort particularly with bending over or coughing. She states that this resulted in significant discomfort and a feeling of fullness in her neck. She came to the emergency department where she was found to be in some distress with tachycardia and hypotension. An echocardiogram was performed which revealed early Hill not and she underwent a echo guided pericardial drain. Repeat echo several days later showed complete resolution of the pericardial effusion and the patient felt significantly better after drainage. The cytology is still pending from the fluid. She does have a history of breast cancer and is status post mastectomy with breast reconstruction.   Past Medical History:  Diagnosis Date  . Breast cancer ([place])   . H/O degenerative disc disease   . Hyperlipidemia   . Hypothyroidism   . Morbid obesity ([place])     Past Surgical History:  Procedure Laterality Date  . PERICARDIOCENTESIS N/A 03/19/2017   Procedure: PERICARDIOCENTESIS;  Surgeon: Nelva Bush, MD;  Location: [facility];  Service: Cardiovascular;  Laterality: N/A;    Family History   Problem Relation Age of Onset  . Colon polyps Father     Social History Social History  Substance Use Topics  . Smoking status: Never Smoker  . Smokeless tobacco: Never Used  . Alcohol use No    Allergies  Allergen Reactions  . Codeine Other (See Comments)    constipation  . Sulfa Antibiotics Other (See Comments)    Allergy as a child    Current Facility-Administered Medications  Medication Dose Route Frequency Provider Last Rate Last Dose  . 0.9 %  sodium chloride infusion  250 mL Intravenous PRN Ether Griffins, Rima, MD      . 0.9 %  sodium chloride infusion  250 mL Intravenous PRN End, Harrell Gave, MD      . acetaminophen (TYLENOL) tablet 650 mg  650 mg Oral Q6H PRN Theodoro Grist, MD   650 mg at 03/21/17 1008   Or  . acetaminophen (TYLENOL) suppository 650 mg  650 mg Rectal Q6H PRN Theodoro Grist, MD      . ALPRAZolam Duanne Moron) tablet 0.5 mg  0.5 mg Oral TID PRN Flora Lipps, MD   0.5 mg at 03/20/17 2159  . [START ON 03/22/2017] bisoprolol (ZEBETA) tablet 5 mg  5 mg Oral Daily Murray Hodgkins R, NP      . budesonide (PULMICORT) nebulizer solution 0.5 mg  0.5 mg Nebulization BID Flora Lipps, MD      . colchicine tablet 0.6 mg  0.6 mg Oral BID Theodoro Grist, MD   0.6 mg at 03/21/17 0951  . cyclobenzaprine (FLEXERIL) tablet 10 mg  10 mg  Oral TID PRN Theodoro Grist, MD      . docusate sodium (COLACE) capsule 100 mg  100 mg Oral BID Theodoro Grist, MD   100 mg at 03/21/17 0950  . DULoxetine (CYMBALTA) DR capsule 90 mg  90 mg Oral Daily Theodoro Grist, MD   90 mg at 03/21/17 0949  . guaiFENesin (MUCINEX) 12 hr tablet 600 mg  600 mg Oral BID Theodoro Grist, MD   600 mg at 03/21/17 0950  . ipratropium-albuterol (DUONEB) 0.5-2.5 (3) MG/3ML nebulizer solution 3 mL  3 mL Nebulization Q4H Awilda Bill, NP   3 mL at 03/21/17 1526  . ipratropium-albuterol (DUONEB) 0.5-2.5 (3) MG/3ML nebulizer solution 3 mL  3 mL Nebulization Q6H PRN Awilda Bill, NP      . levothyroxine (SYNTHROID,  LEVOTHROID) tablet 137 mcg  137 mcg Oral QAC breakfast Theodoro Grist, MD   137 mcg at 03/21/17 0951  . methylPREDNISolone sodium succinate (SOLU-MEDROL) 40 mg/mL injection 40 mg  40 mg Intravenous Q12H Flora Lipps, MD   40 mg at 03/21/17 0950  . ondansetron (ZOFRAN) tablet 4 mg  4 mg Oral Q6H PRN Theodoro Grist, MD       Or  . ondansetron (ZOFRAN) injection 4 mg  4 mg Intravenous Q6H PRN Theodoro Grist, MD      . oxyCODONE (Oxy IR/ROXICODONE) immediate release tablet 5 mg  5 mg Oral Q4H PRN End, Harrell Gave, MD   5 mg at 03/20/17 2159  . pantoprazole (PROTONIX) injection 40 mg  40 mg Intravenous Q24H Awilda Bill, NP   40 mg at 03/21/17 0050  . piperacillin-tazobactam (ZOSYN) IVPB 3.375 g  3.375 g Intravenous Q8H Kasa, Kurian, MD      . sodium chloride 0.9 % bolus 250 mL  250 mL Intravenous Once End, Christopher, MD      . topiramate (TOPAMAX) tablet 50 mg  50 mg Oral Daily Theodoro Grist, MD   50 mg at 03/21/17 0951  . traMADol (ULTRAM) tablet 25-50 mg  25-50 mg Oral Q6H PRN Theodoro Grist, MD   50 mg at 03/20/17 4174      Review of Systems A complete review of systems was asked and was negative except for the following positive findings Increasing shortness of breath, chest discomfort.  These symptoms improved after pericardial drainage  Blood pressure 111/90, pulse 98, temperature 97.7 F (36.5 C), temperature source Oral, resp. rate (!) 23, height 5\' 3"  (1.6 m), weight 238 lb 1.6 oz (108 kg), SpO2 99 %.  Physical Exam CONSTITUTIONAL:  Pleasant, well-developed, well-nourished, and in no acute distress. EYES: Pupils equal and reactive to light, Sclera non-icteric EARS, NOSE, MOUTH AND THROAT:  The oropharynx was clear.  Dentition is good repair.  Oral mucosa pink and moist. LYMPH NODES:  Lymph nodes in the neck and axillae were normal RESPIRATORY:  Lungs were clear.  Normal respiratory effort without pathologic use of accessory muscles of respiration CARDIOVASCULAR: Heart was  regular without murmurs.  There were no carotid bruits. GI: The abdomen was soft, nontender, and nondistended. There were no palpable masses. There was no hepatosplenomegaly. There were normal bowel sounds in all quadrants. GU:  Rectal deferred.   MUSCULOSKELETAL:  Normal muscle strength and tone.  No clubbing or cyanosis.   SKIN:  There were no pathologic skin lesions.  There were no nodules on palpation. NEUROLOGIC:  Sensation is normal.  Cranial nerves are grossly intact. PSYCH:  Oriented to person, place and time.  Mood and affect are normal.  Data Reviewed CT scan  I have personally reviewed the patient's imaging, laboratory findings and medical records.    Assessment    Undiagnosed large pericardial effusion with evidence of right ventricular compromise status post pericardiocentesis.    Plan    I explained the patient that we are still awaiting the results of the fluid. I did discuss with her briefly the option of pericardial window. We will await the results of the pathology and she will likely require another echo in the future.       Nestor Lewandowsky, MD 03/21/2017, 3:30 PM

## 2017-03-22 ENCOUNTER — Inpatient Hospital Stay (HOSPITAL_COMMUNITY)
Admit: 2017-03-22 | Discharge: 2017-03-22 | Disposition: A | Discharge status: Home / Self Care | Payer: 59 | Attending: Nurse Practitioner | Admitting: Nurse Practitioner

## 2017-03-22 DIAGNOSIS — I319 Disease of pericardium, unspecified: Secondary | ICD-10-CM

## 2017-03-22 LAB — CULTURE, RESPIRATORY W GRAM STAIN: Culture: NORMAL

## 2017-03-22 LAB — ECHOCARDIOGRAM LIMITED
FS: 36 % (ref 28–44)
Height: 63 in
IVS/LV PW RATIO, ED: 1.03
LA ID, A-P, ES: 31 mm
LADIAMINDEX: 1.51 cm/m2
LEFT ATRIUM END SYS DIAM: 31 mm
LVOT area: 3.14 cm2
LVOTD: 20 mm
PW: 9.99 mm — AB (ref 0.6–1.1)
WEIGHTICAEL: 3679.04 [oz_av]

## 2017-03-22 LAB — BASIC METABOLIC PANEL
Anion gap: 9 (ref 5–15)
BUN: 15 mg/dL (ref 6–20)
CALCIUM: 9 mg/dL (ref 8.9–10.3)
CO2: 24 mmol/L (ref 22–32)
CREATININE: 0.82 mg/dL (ref 0.44–1.00)
Chloride: 110 mmol/L (ref 101–111)
GFR calc Af Amer: >60 mL/min (ref >60)
GLUCOSE: 122 mg/dL — AB (ref 65–99)
POTASSIUM: 3.2 mmol/L — AB (ref 3.5–5.1)
SODIUM: 143 mmol/L (ref 135–145)

## 2017-03-22 LAB — GLUCOSE, CAPILLARY: GLUCOSE-CAPILLARY: 131 mg/dL — AB (ref 65–99)

## 2017-03-22 LAB — MAGNESIUM: Magnesium: 2.2 mg/dL (ref 1.7–2.4)

## 2017-03-22 LAB — PHOSPHORUS: PHOSPHORUS: 2.3 mg/dL — AB (ref 2.5–4.6)

## 2017-03-22 LAB — PROCALCITONIN

## 2017-03-22 MED ORDER — FUROSEMIDE 10 MG/ML IJ SOLN
20.0000 mg | Freq: Once | INTRAMUSCULAR | Status: AC
  Administered 2017-03-22: 20 mg via INTRAVENOUS
  Filled 2017-03-22: qty 2

## 2017-03-22 MED ORDER — BISOPROLOL FUMARATE 10 MG PO TABS
10.0000 mg | ORAL_TABLET | Freq: Every day | ORAL | Status: DC
  Administered 2017-03-23 – 2017-03-24 (×2): 10 mg via ORAL
  Filled 2017-03-22 (×2): qty 1

## 2017-03-22 MED ORDER — SENNA 8.6 MG PO TABS
1.0000 | ORAL_TABLET | Freq: Two times a day (BID) | ORAL | Status: DC
  Filled 2017-03-22 (×2): qty 1

## 2017-03-22 MED ORDER — GUAIFENESIN-CODEINE 100-10 MG/5ML PO SOLN: 5.0000 mL | ORAL | Status: DC | PRN

## 2017-03-22 MED ORDER — POTASSIUM CHLORIDE 20 MEQ PO PACK
40.0000 meq | PACK | Freq: Once | ORAL | Status: AC
  Administered 2017-03-23: 40 meq via ORAL
  Filled 2017-03-22: qty 2

## 2017-03-22 NOTE — Consult Note (Signed)
   Name: Julie Irwin MRN: [id number] DOB: 1955-12-12    ADMISSION DATE:  03/18/2017 CONSULTATION DATE: 03/18/2017  REFERRING MD : Dr. Ether Griffins   CHIEF COMPLAINT: Shortness of Breath  BRIEF PATIENT DESCRIPTION:  61 yo female admitted with acute respiratory failure and SVT CT Angio Chest revealing a large pericardial effusion   SIGNIFICANT EVENTS  08/6-Pt admitted to the [facility]  8/7 pericardial drain placed 8/8 250 CC output from drain 8/9 drain removed  STUDIES:  CT Angio Chest 08/6>>Large pericardial effusion.  Cardiology assessment recommended. Small bilateral pleural effusions with atelectasis and ground-glass opacities likely representing stigmata of CHF. No acute pulmonary embolus. Bilateral skin thickening and subcutaneous fatty induration of the left breast with underlying bilateral saline subglandular implants. Clinical correlation recommended. Aortic Atherosclerosis  HISTORY OF PRESENT ILLNESS:   Alert and awake Less distressed today Discussed concerns of hemorrhagic effusion could be related to previous malignancy Patient understands Pathology pending   REVIEW OF SYSTEMS:  Constitutional: fever, chills, weight loss, malaise/fatigue and diaphoresis.  HENT: Negative for hearing loss, ear pain, nosebleeds, congestion, sore throat, neck pain, tinnitus and ear discharge.   Eyes: Negative for blurred vision, double vision, photophobia, pain, discharge and redness.  Respiratory: - shortness of breath, -wheezing and stridor.   Cardiovascular: chest discomfort, palpitations, orthopnea, claudication, leg swelling and PND.  Gastrointestinal: Negative for heartburn, nausea, vomiting, abdominal pain, diarrhea, constipation, blood in stool     PHYSICAL EXAMINATION: General: Well-developed, well-nourished Caucasian female, NAD Neuro: Alert an d oriented, follows commands HEENT: Supple, no JVD Cardiovascular: Sinus tach, S1-S2, no M/R/G Lungs: Diminished throughout, even,  nonlabored; no wheezes, rales, rhonchi, or crackles Abdomen: Hypoactive bowel sounds 4, epigastric tenderness, obese, soft, nondistended Musculoskeletal: Normal bulk and tone, no edema Skin: Intact no rashes or lesions    VITAL SIGNS: Temp:  [97.7 F (36.5 C)-98.3 F (36.8 C)] 98.2 F (36.8 C) (08/10 0500) Pulse Rate:  [84-111] 103 (08/10 0600) Resp:  [10-31] 24 (08/10 0700) BP: (69-126)/(54-90) 100/85 (08/10 0700) SpO2:  [91 %-100 %] 100 % (08/10 0700) Weight:  [229 lb 15 oz (104.3 kg)] 229 lb 15 oz (104.3 kg) (08/10 0500)  Recent Labs Lab 03/18/17 1449 03/19/17 0445 03/20/17 0548  NA 136 138 140  K 3.8 3.7 3.4*  CL 104 109 110  CO2 21* 20* 24  BUN 18 16 18   CREATININE 0.96 0.71 0.76  GLUCOSE 148* 144* 90    Recent Labs Lab 03/18/17 1555 03/19/17 0445 03/21/17 0900  HGB 11.9* 12.4 12.4  HCT 35.2 36.7 37.1  WBC 11.5* 10.9 6.7  PLT 219 238 239     ASSESSMENT / PLAN: Acute hypoxic respiratory failure likely secondary to large pericardial effusion-Slowly resolving Incidental finding of large pericardial effusion (CT Angio Chest findings 03/18/17) Lactic acidosis Mild leukocytosis Anemia without blood loss P: Supplemental O2 to maintain O2 sats greater than 92% or for dyspnea Prn chest x-ray Cardiology consulted appreciate input-placed pericardial drain-now removed Continuous telemetry monitoring Follow cultures Continue empiric abx for now  Lovenox for DVT prophylaxis Transfuse for Hgb of less than 7 Await pathology results May need pericardial window-follow up Dr. Genevive Bi recommendation Consider stepdown status for the next 24 hours and then transferred to general medical floor tomorrow  Patient  satisfied with Plan of action and management. All questions answered  Corrin Parker, M.D.  [facility]  Medical Director [place] Director [facility] Cardio-Pulmonary Department

## 2017-03-22 NOTE — Progress Notes (Signed)
RN paged Dr. Fletcher Anon and Jeneen Rinks from Cath lab called back and RN made him aware and asked him to let Dr. Fletcher Anon know that patient just had a 10 beat run of SVT rate 160s.  Jeneen Rinks acknowledged and stated he would make Dr. Fletcher Anon aware.  RN also made Dr. Mortimer Fries aware of SVT run.

## 2017-03-22 NOTE — Progress Notes (Signed)
[place] [place] NAME: Julie Irwin    MR#:  [id number]  DATE OF BIRTH:  May 22, 1956  SUBJECTIVE:  No chest pain, palpitation or shortness of breath. REVIEW OF SYSTEMS:    Review of Systems  Constitutional: Negative for fever, chills weight loss HENT: Negative for ear pain, nosebleeds, congestion, facial swelling, rhinorrhea, neck pain, neck stiffness and ear discharge.   Respiratory: no shortness of breath no wheezing or cough Cardiovascular: Negative for chest pain, palpitations and leg swelling.  Gastrointestinal: Negative for heartburn, abdominal pain, vomiting, diarrhea or consitpation Genitourinary: Negative for dysuria, urgency, frequency, hematuria Musculoskeletal: Negative for back pain or joint pain Neurological: Negative for dizziness, seizures, syncope, focal weakness,  numbness and headaches.  Hematological: Does not bruise/bleed easily.  Psychiatric/Behavioral: Negative for hallucinations, confusion, dysphoric mood DRUG ALLERGIES:   Allergies  Allergen Reactions  . Codeine Other (See Comments)    constipation  . Sulfa Antibiotics Other (See Comments)    Allergy as a child    VITALS:  Blood pressure 112/68, pulse (!) 104, temperature (!) 97.5 F (36.4 C), temperature source Oral, resp. rate (!) 23, height 5\' 3"  (1.6 m), weight 229 lb 15 oz (104.3 kg), SpO2 99 %.  PHYSICAL EXAMINATION:  Constitutional: Appears well-developed and well-nourished. No distress. HENT: Normocephalic. Marland Kitchen Oropharynx is clear and moist.  Eyes: Conjunctivae and EOM are normal. PERRLA, no scleral icterus.  Neck: Normal ROM. Neck supple. No JVD. No tracheal deviation. CVS: Tachycardia S1/S2 +, no murmurs, no gallops, no carotid bruit.  Pulmonary: Effort and breath sounds normal, no wheezing, no use of accessory muscles to breath. Abdominal: Soft. BS +,  no distension, tenderness, rebound or guarding.  Musculoskeletal: Normal range of motion. No  edema and no tenderness.  Neuro: Alert. CN 2-12 grossly intact. No focal deficits. Skin: Skin is warm and dry. No rash noted. Psychiatric: Normal mood and affect.  LABORATORY PANEL:   CBC  Recent Labs Lab 03/21/17 0900  WBC 6.7  HGB 12.4  HCT 37.1  PLT 239   ------------------------------------------------------------------------------------------------------------------  Chemistries   Recent Labs Lab 03/18/17 1449  03/20/17 0548  NA 136  < > 140  K 3.8  < > 3.4*  CL 104  < > 110  CO2 21*  < > 24  GLUCOSE 148*  < > 90  BUN 18  < > 18  CREATININE 0.96  < > 0.76  CALCIUM 9.2  < > 8.0*  MG  --   < > 2.1  AST 46*  --   --   ALT 60*  --   --   ALKPHOS 72  --   --   BILITOT 1.2  --   --   < > = values in this interval not displayed. ------------------------------------------------------------------------------------------------------------------  Cardiac Enzymes  Recent Labs Lab 03/18/17 2250 03/19/17 0445 03/19/17 1245  TROPONINI 0.07* 0.08* 0.35*   ------------------------------------------------------------------------------------------------------------------  RADIOLOGY:  Dg Chest Port 1 View  Result Date: 03/21/2017 CLINICAL DATA:  61 year old female with shortness of Breath. Pericardiocentesis 2 days ago. Pericardial and pleural effusions. EXAM: PORTABLE CHEST 1 VIEW COMPARISON:  03/19/2017 and earlier. FINDINGS: Portable AP upright view at 0808 hours. Bilateral breast implants incidentally noted. Continued veiling opacity at the left lung base and indistinct appearance of the lower heart border. A drainage catheter continues to project over the cardiac silhouette and appears stable. Small right pleural effusion appears stable. Decreased pulmonary vascularity. No pneumothorax. Other mediastinal contours are stable. Visualized tracheal  air column is within normal limits. IMPRESSION: 1. Stable pericardial catheter and cardiac size since 03/19/2017. 2. Decreased  pulmonary vascularity, no acute edema. 3. Continued confluent opacity at the left lung base, probably a combination of pleural effusion and atelectasis or consolidation. 4. No new cardiopulmonary abnormality. Electronically Signed   By: Genevie Ann M.D.   On: 03/21/2017 08:48     ASSESSMENT AND PLAN:   61 year old female with history of right breast cancer status post bilateral mastectomy in 2012 and hypothyroidism who presented with shortness of breath and found to have large pericardial effusion with signs of tamponade physiology and SVT.  1. Large pericardial effusion with tamponade. Patient is s/p pericardiocentesis with drain placed. Follow-up on fluid and final cytology results Follow up ECHO shows resolution of circumferential pericardial fluid. There is echogenic material near the apex abutting the RV suspicious for intrapericardial thrombus or other organized material.  Appreciate cardiology consult Pericardial drain removed 8/9. Follow-up limited echocardiogram today. follow up Dr. Genevive Bi recommendation for pericardial window.  * Acute respiratory failure with hypoxemia due to above. Improved. Off O2 [place]. NEB prn.  2. SVT in the setting of problem #1 This has improved after pericardiocentesis Continue metoprolol  3. Community-acquired pneumonia: Continue zosyn as per intensivist team 4. Hypothyroid: Continue Synthroid 5 depression: Continue Cymbalta  6. History of gout: Continue colchine 7. Elevated troponin due to demand ischemia from large pericardial effusion. Patient has ruled out for ACS. No ischemic eval at this time.  Management plans discussed with the patient and she is in agreement.  CODE STATUS: full  TOTAL TIME TAKING CARE OF THIS PATIENT: 35 minutes.     POSSIBLE D/C 2 days, DEPENDING ON CLINICAL CONDITION.   Demetrios Loll M.D on 03/22/2017 at 4:36 PM  Between 7am to 6pm - Pager - [phone] After 6pm go to [email or web address] - password [email or web address]  [facility]  Office  [phone]  CC: Primary care physician; Adin Hector, MD  Note: This dictation was prepared with Dragon dictation along with smaller phrase technology. Any transcriptional errors that result from this process are unintentional.

## 2017-03-22 NOTE — Progress Notes (Signed)
*  PRELIMINARY RESULTS* Echocardiogram 2D Echocardiogram has been performed.  Julie Irwin 03/22/2017, 4:05 PM

## 2017-03-22 NOTE — Progress Notes (Signed)
Progress Note  Patient Name: Julie Irwin Date of Encounter: 03/22/2017  Primary Cardiologist: Andree Coss, MD   Subjective   Breathing better this am.  Breathing treatments helping - wheezing less.  Heart rates elevated - trending in low 100's - sinus.  Inpatient Medications    Scheduled Meds: . bisoprolol  5 mg Oral Daily  . budesonide (PULMICORT) nebulizer solution  0.5 mg Nebulization BID  . colchicine  0.6 mg Oral BID  . docusate sodium  100 mg Oral BID  . DULoxetine  90 mg Oral Daily  . ipratropium-albuterol  3 mL Nebulization Q4H  . levothyroxine  137 mcg Oral QAC breakfast  . methylPREDNISolone (SOLU-MEDROL) injection  40 mg Intravenous Q12H  . pantoprazole (PROTONIX) IV  40 mg Intravenous Q24H  . senna  1 tablet Oral BID  . topiramate  50 mg Oral Daily   Continuous Infusions: . sodium chloride    . sodium chloride    . piperacillin-tazobactam (ZOSYN)  IV Stopped (03/22/17 0945)  . sodium chloride     PRN Meds: sodium chloride, sodium chloride, acetaminophen **OR** acetaminophen, ALPRAZolam, cyclobenzaprine, guaiFENesin-codeine, ipratropium-albuterol, ondansetron **OR** ondansetron (ZOFRAN) IV, oxyCODONE, traMADol   Vital Signs    Vitals:   03/22/17 0600 03/22/17 0700 03/22/17 0742 03/22/17 1054  BP: 106/60 100/85  128/70  Pulse: (!) 103   (!) 122  Resp: 10 (!) 24  (!) 26  Temp:   (!) 97.5 F (36.4 C)   TempSrc:   Oral   SpO2: 100% 100%  100%  Weight:      Height:        Intake/Output Summary (Last 24 hours) at 03/22/17 1235 Last data filed at 03/22/17 1221  Gross per 24 hour  Intake              560 ml  Output             2745 ml  Net            -2185 ml   Filed Weights   03/20/17 0500 03/21/17 0500 03/22/17 0500  Weight: 231 lb 7.7 oz (105 kg) 238 lb 1.6 oz (108 kg) 229 lb 15 oz (104.3 kg)    Physical Exam   GEN: Well nourished, well developed, in no acute distress.  HEENT: Grossly normal.  Neck: Supple, no JVD, carotid bruits, or  masses. Cardiac: RRR, tachy, no murmurs, rubs, or gallops. No clubbing, cyanosis, edema.  Radials/DP/PT 2+ and equal bilaterally.  Respiratory:  Respirations regular and unlabored, diminished breath sounds bilat bases - faint exp wheeze. GI: Soft, nontender, nondistended, BS + x 4. MS: no deformity or atrophy. Skin: warm and dry, no rash. Neuro:  Strength and sensation are intact. Psych: AAOx3.  Normal affect.  Labs    Chemistry Recent Labs Lab 03/18/17 1449 03/19/17 0445 03/19/17 1244 03/20/17 0548  NA 136 138  --  140  K 3.8 3.7  --  3.4*  CL 104 109  --  110  CO2 21* 20*  --  24  GLUCOSE 148* 144*  --  90  BUN 18 16  --  18  CREATININE 0.96 0.71  --  0.76  CALCIUM 9.2 8.2*  --  8.0*  PROT 7.3  --  6.5  --   ALBUMIN 3.9  --   --   --   AST 46*  --   --   --   ALT 60*  --   --   --  ALKPHOS 72  --   --   --   BILITOT 1.2  --   --   --   GFRNONAA >60 >60  --  >60  GFRAA >60 >60  --  >60  ANIONGAP 11 9  --  6     Hematology Recent Labs Lab 03/18/17 1555 03/19/17 0445 03/21/17 0900  WBC 11.5* 10.9 6.7  RBC 3.77* 3.88 3.93  HGB 11.9* 12.4 12.4  HCT 35.2 36.7 37.1  MCV 93.5 94.5 94.3  MCH 31.7 32.0 31.4  MCHC 33.9 33.9 33.4  RDW 13.0 13.2 13.2  PLT 219 238 239    Cardiac Enzymes Recent Labs Lab 03/18/17 1449 03/18/17 2250 03/19/17 0445 03/19/17 1245  TROPONINI <0.03 0.07* 0.08* 0.35*      BNP Recent Labs Lab 03/18/17 1449  BNP 114.0*     Radiology    Dg Chest Port 1 View  Result Date: 03/21/2017 CLINICAL DATA:  61 year old female with shortness of Breath. Pericardiocentesis 2 days ago. Pericardial and pleural effusions. EXAM: PORTABLE CHEST 1 VIEW COMPARISON:  03/19/2017 and earlier. FINDINGS: Portable AP upright view at 0808 hours. Bilateral breast implants incidentally noted. Continued veiling opacity at the left lung base and indistinct appearance of the lower heart border. A drainage catheter continues to project over the cardiac silhouette  and appears stable. Small right pleural effusion appears stable. Decreased pulmonary vascularity. No pneumothorax. Other mediastinal contours are stable. Visualized tracheal air column is within normal limits. IMPRESSION: 1. Stable pericardial catheter and cardiac size since 03/19/2017. 2. Decreased pulmonary vascularity, no acute edema. 3. Continued confluent opacity at the left lung base, probably a combination of pleural effusion and atelectasis or consolidation. 4. No new cardiopulmonary abnormality. Electronically Signed   By: Genevie Ann M.D.   On: 03/21/2017 08:48    Telemetry    Sinus tachy - 100's to 1-teens - Personally Reviewed  Cardiac Studies   TTE 03/19/17: Study Conclusions  - Left ventricle: The cavity size was normal. Wall thickness was normal. Systolic function was normal. The estimated ejection fraction was in the range of 55% to 65%. The study is not technically sufficient to allow evaluation of LV diastolic function. - Right ventricle: The cavity size was normal to small with early diastolic collapse. Systolic function was normal. - Pericardium, extracardiac: A large pericardial effusion was identified circumferential to the heart. There was right ventricular chamber collapse. Findings are suggestive of tamponade physiology, though evaluation is limited by lack of mitral and tricuspid inflow velocities. _____________  Pericardiocentesis 03/19/17: Conclusion   Conclusions: 1. Successful pericardiocentesis and tube pericardiostomy yielding 700 mL of bloody fluid (opening pressure 12 mmHg, closing pressure 0 mmHg).  _____________   Patient Profile     61 y.o.femalewith history of right-sided breast cancer status post bilateral mastectomy in 2012 status post letrozoleand Herceptin status post reconstructive surgery of bilateral implants, hypothyroidism, degenerative disc disease, morbid obesity, hyperlipidemia, anxiety, and depression who presented  to [facility] on 03/18/2017 with complaints of cough, shortness of breath, and discomfort in the chest radiating to the neck with associated fatigue and weakness. She was found to have a large pericardial effusion s/p pericardiocentesis on 8/7 - drain removed 8/10.  Assessment & Plan    1.  Pericardial Effusion w/ findings suggestive of tamponade physiology:  S/p pericardiocentesis on 8/7 w/ 750 ml of bloody aspirate.  Culture NGTD.  Path pending.  Pericardial drain removed 8/9.  Breathing stable.  HRs elevated this am - sinus tachycardia.  No rub  on exam.  Plan limited echo in am to assess for re-accumlation of fluid.  CT surgery following - ? Need for window.  2.  Elevated troponin/demand ischemia:  In setting of above.  EF nl w/o wma on echo.  Suspect demand ischemia.  No ischemic eval @ this time.  3.  PSVT:  Continues to have brief runs.  Titrate bisoprolol.  4.  AECOPD:  Inhalers, nebs, steroids, abx per IM.  Signed, Murray Hodgkins, NP  03/22/2017, 12:35 PM

## 2017-03-23 LAB — BODY FLUID CULTURE
Culture: NO GROWTH
SPECIAL REQUESTS: NORMAL

## 2017-03-23 LAB — CULTURE, BLOOD (ROUTINE X 2)
CULTURE: NO GROWTH
Special Requests: ADEQUATE

## 2017-03-23 LAB — GLUCOSE, CAPILLARY: Glucose-Capillary: 102 mg/dL — ABNORMAL HIGH (ref 65–99)

## 2017-03-23 MED ORDER — IPRATROPIUM-ALBUTEROL 0.5-2.5 (3) MG/3ML IN SOLN
3.0000 mL | Freq: Two times a day (BID) | RESPIRATORY_TRACT | Status: DC
  Administered 2017-03-23 – 2017-03-24 (×2): 3 mL via RESPIRATORY_TRACT
  Filled 2017-03-23 (×2): qty 3

## 2017-03-23 MED ORDER — ENOXAPARIN SODIUM 40 MG/0.4ML ~~LOC~~ SOLN
40.0000 mg | Freq: Every day | SUBCUTANEOUS | Status: DC
  Administered 2017-03-24: 40 mg via SUBCUTANEOUS
  Filled 2017-03-23: qty 0.4

## 2017-03-23 MED ORDER — K PHOS MONO-SOD PHOS DI & MONO 155-852-130 MG PO TABS
500.0000 mg | ORAL_TABLET | Freq: Every day | ORAL | Status: AC
  Administered 2017-03-23 – 2017-03-24 (×2): 500 mg via ORAL
  Filled 2017-03-23 (×2): qty 2

## 2017-03-23 NOTE — Progress Notes (Signed)
Patient had no SVT runs through the night, she used the [facility] (urine) multi times, and complain of no pain.

## 2017-03-23 NOTE — Progress Notes (Signed)
   Name: Julie Irwin MRN: [id number] DOB: [birth year]    ADMISSION DATE:  03/18/2017   BRIEF PATIENT DESCRIPTION:  61 yo female admitted with acute respiratory failure and SVT CT Angio Chest revealing a large pericardial effusion   SIGNIFICANT EVENTS  08/6-Pt admitted to the [facility]  8/7 pericardial drain placed 8/8 250 CC output from drain 8/9 drain removed  STUDIES:  CT Angio Chest 08/6>>Large pericardial effusion.  Cardiology assessment recommended. Small bilateral pleural effusions with atelectasis and ground-glass opacities likely representing stigmata of CHF. No acute pulmonary embolus. Bilateral skin thickening and subcutaneous fatty induration of the left breast with underlying bilateral saline subglandular implants. Clinical correlation recommended. Aortic Atherosclerosis  Subjective: Alert and awake, no new complaints today, patient feels well. Pathology pending   REVIEW OF SYSTEMS:  Constitutional: fever, chills, weight loss, malaise/fatigue and diaphoresis.  HENT: Negative for hearing loss, ear pain, nosebleeds, congestion, sore throat, neck pain, tinnitus and ear discharge.   Eyes: Negative for blurred vision, double vision, photophobia, pain, discharge and redness.  Respiratory: - shortness of breath, -wheezing and stridor.   Cardiovascular: No chest discomfort, palpitations, orthopnea, claudication, leg swelling and PND.  Gastrointestinal: Negative for heartburn, nausea, vomiting, abdominal pain, diarrhea, constipation, blood in stool     PHYSICAL EXAMINATION: General: Well-developed, well-nourished Caucasian female, NAD Neuro: Alert an d oriented, follows commands HEENT: Supple, no JVD Cardiovascular: Sinus tach, S1-S2, no M/R/G Lungs: Diminished throughout, even, nonlabored; no wheezes, rales, rhonchi, or crackles Abdomen: Hypoactive bowel sounds 4, epigastric tenderness, obese, soft, nondistended Musculoskeletal: Normal bulk and tone, no edema Skin:  Intact no rashes or lesions    VITAL SIGNS: Temp:  [97.8 F (36.6 C)-98.4 F (36.9 C)] 98.4 F (36.9 C) (08/11 0835) Pulse Rate:  [80-104] 88 (08/11 1000) Resp:  [15-29] 21 (08/11 1000) BP: (111-129)/(59-74) 114/69 (08/11 1000) SpO2:  [93 %-100 %] 100 % (08/11 1000) Weight:  [226 lb 3.1 oz (102.6 kg)] 226 lb 3.1 oz (102.6 kg) (08/11 0500)  Recent Labs Lab 03/19/17 0445 03/20/17 0548 03/22/17 2155  NA 138 140 143  K 3.7 3.4* 3.2*  CL 109 110 110  CO2 20* 24 24  BUN 16 18 15   CREATININE 0.71 0.76 0.82  GLUCOSE 144* 90 122*    Recent Labs Lab 03/18/17 1555 03/19/17 0445 03/21/17 0900  HGB 11.9* 12.4 12.4  HCT 35.2 36.7 37.1  WBC 11.5* 10.9 6.7  PLT 219 238 239     ASSESSMENT / PLAN: Acute hypoxic respiratory failure likely secondary to large pericardial Improved, currently oxygen saturation is 98% on room air. Incidental finding of large pericardial effusion (CT Angio Chest findings 03/18/17)-cytology pending. Mild leukocytosis. Procalcitonin negative Anemia without blood loss P: Supplemental O2 to maintain O2 sats greater than 92% or for dyspnea Cardiology consulted appreciate input-placed pericardial drain-now removed Continuous telemetry monitoring Follow cultures Continue empiric abx for now  Lovenox for DVT prophylaxis Transfuse for Hgb of less than 7 Await pathology results May need pericardial window-follow up Dr. Genevive Bi recommendation   Marda Stalker, MD.   Board Certified in Internal Medicine, Pulmonary Medicine, [place], and Sleep Medicine.  [facility] Office Number: [phone] Pager: [phone]  Patricia Pesa, M.D.  Merton Border, M.D

## 2017-03-23 NOTE — Progress Notes (Signed)
Rec'd report from [place], Therapist, sports. Pt sitting up in recliner chair visiting with family.  A&O x 3, pleasant, in NAD.  Awaiting transfer to 2A.  Bubba Camp, RN

## 2017-03-23 NOTE — Progress Notes (Signed)
No further SVT observed this shift.  Patient in no apparent distress.  VSS, no complaint of pain and discomfort.  Patient awaiting transfer to new room.  Care transfer to receiving RNs Quita Skye and Pamala Hurry, report given with no further questions.

## 2017-03-23 NOTE — Progress Notes (Signed)
Patient ID: Julie Irwin, female   DOB: 04-03-1956, 61 y.o.   MRN: [id number]   Progress Note  Patient Name: Julie Irwin Date of Encounter: 03/23/2017  Primary Cardiologist: Andree Coss, MD   Subjective   Feels "like a new woman" today.  No wheezing.  No dyspnea.  HR in 80s, NSR.   Echo yesterday with moderate left pleural effusion, no pericardial effusion.   Inpatient Medications    Scheduled Meds: . bisoprolol  10 mg Oral Daily  . budesonide (PULMICORT) nebulizer solution  0.5 mg Nebulization BID  . colchicine  0.6 mg Oral BID  . docusate sodium  100 mg Oral BID  . DULoxetine  90 mg Oral Daily  . ipratropium-albuterol  3 mL Nebulization BID  . levothyroxine  137 mcg Oral QAC breakfast  . methylPREDNISolone (SOLU-MEDROL) injection  40 mg Intravenous Q12H  . pantoprazole (PROTONIX) IV  40 mg Intravenous Q24H  . phosphorus  500 mg Oral Daily  . senna  1 tablet Oral BID  . topiramate  50 mg Oral Daily   Continuous Infusions: . sodium chloride    . sodium chloride    . piperacillin-tazobactam (ZOSYN)  IV Stopped (03/23/17 0951)  . sodium chloride     PRN Meds: sodium chloride, sodium chloride, acetaminophen **OR** acetaminophen, ALPRAZolam, cyclobenzaprine, guaiFENesin-codeine, ipratropium-albuterol, ondansetron **OR** ondansetron (ZOFRAN) IV, oxyCODONE, traMADol   Vital Signs     03/21/17 0800 03/21/17 0900 03/21/17 0949  BP: (!) 104/54 105/75 105/75  Pulse: 86 92 92  Resp: (!) 22 (!) 24   Temp: 97.7 F (36.5 C)    TempSrc: Oral    SpO2: 97% 99%   Weight:     Height:       Intake/Output Summary (Last 24 hours) at 03/23/17 1214 Last data filed at 03/23/17 0918  Gross per 24 hour  Intake               50 ml  Output             1425 ml  Net            -1375 ml   Filed Weights   03/21/17 0500 03/22/17 0500 03/23/17 0500  Weight: 238 lb 1.6 oz (108 kg) 229 lb 15 oz (104.3 kg) 226 lb 3.1 oz (102.6 kg)    Physical Exam   General: NAD Neck: No JVD, no  thyromegaly or thyroid nodule.  Lungs: Decreased breath sounds left base, no wheezing. CV: Nondisplaced PMI.  Heart regular S1/S2, no S3/S4, no murmur.  No peripheral edema.   Abdomen: Soft, nontender, no hepatosplenomegaly, no distention.  Skin: Intact without lesions or rashes.  Neurologic: Alert and oriented x 3.  Psych: Normal affect. Extremities: No clubbing or cyanosis.  HEENT: Normal.    Labs    Chemistry  Recent Labs Lab 03/18/17 1449 03/19/17 0445 03/19/17 1244 03/20/17 0548 03/22/17 2155  NA 136 138  --  140 143  K 3.8 3.7  --  3.4* 3.2*  CL 104 109  --  110 110  CO2 21* 20*  --  24 24  GLUCOSE 148* 144*  --  90 122*  BUN 18 16  --  18 15  CREATININE 0.96 0.71  --  0.76 0.82  CALCIUM 9.2 8.2*  --  8.0* 9.0  PROT 7.3  --  6.5  --   --   ALBUMIN 3.9  --   --   --   --   AST 46*  --   --   --   --  ALT 60*  --   --   --   --   ALKPHOS 72  --   --   --   --   BILITOT 1.2  --   --   --   --   GFRNONAA >60 >60  --  >60 >60  GFRAA >60 >60  --  >60 >60  ANIONGAP 11 9  --  6 9     Hematology  Recent Labs Lab 03/18/17 1555 03/19/17 0445 03/21/17 0900  WBC 11.5* 10.9 6.7  RBC 3.77* 3.88 3.93  HGB 11.9* 12.4 12.4  HCT 35.2 36.7 37.1  MCV 93.5 94.5 94.3  MCH 31.7 32.0 31.4  MCHC 33.9 33.9 33.4  RDW 13.0 13.2 13.2  PLT 219 238 239    Cardiac Enzymes  Recent Labs Lab 03/18/17 1449 03/18/17 2250 03/19/17 0445 03/19/17 1245  TROPONINI <0.03 0.07* 0.08* 0.35*      BNP  Recent Labs Lab 03/18/17 1449  BNP 114.0*      Radiology    Dg Chest Port 1 View  Result Date: 03/21/2017 CLINICAL DATA:  60 year old female with shortness of Breath. Pericardiocentesis 2 days ago. Pericardial and pleural effusions. EXAM: PORTABLE CHEST 1 VIEW COMPARISON:  03/19/2017 and earlier. FINDINGS: Portable AP upright view at 0808 hours. Bilateral breast implants incidentally noted. Continued veiling opacity at the left lung base and indistinct appearance of the lower  heart border. A drainage catheter continues to project over the cardiac silhouette and appears stable. Small right pleural effusion appears stable. Decreased pulmonary vascularity. No pneumothorax. Other mediastinal contours are stable. Visualized tracheal air column is within normal limits. IMPRESSION: 1. Stable pericardial catheter and cardiac size since 03/19/2017. 2. Decreased pulmonary vascularity, no acute edema. 3. Continued confluent opacity at the left lung base, probably a combination of pleural effusion and atelectasis or consolidation. 4. No new cardiopulmonary abnormality. Electronically Signed   By: Genevie Ann M.D.   On: 03/21/2017 08:48    Telemetry    RSR, brief run of atrial tachycardia this am - Personally Reviewed  Cardiac Studies   TTE 03/19/17: Study Conclusions  - Left ventricle: The cavity size was normal. Wall thickness was normal. Systolic function was normal. The estimated ejection fraction was in the range of 55% to 65%. The study is not technically sufficient to allow evaluation of LV diastolic function. - Right ventricle: The cavity size was normal to small with early diastolic collapse. Systolic function was normal. - Pericardium, extracardiac: A large pericardial effusion was identified circumferential to the heart. There was right ventricular chamber collapse. Findings are suggestive of tamponade physiology, though evaluation is limited by lack of mitral and tricuspid inflow velocities. _____________  Pericardiocentesis 03/19/17: Conclusion   Conclusions: 1. Successful pericardiocentesis and tube pericardiostomy yielding 700 mL of bloody fluid (opening pressure 12 mmHg, closing pressure 0 mmHg).  _____________   Patient Profile     61 y.o. female with history of right-sided breast cancer status post bilateral mastectomy in 2012 status post letrozoleand Herceptin status post reconstructive surgery of bilateral implants, hypothyroidism,  degenerative disc disease, morbid obesity, hyperlipidemia, anxiety, and depression who presented to [facility] on 03/18/2017 with complaints of cough, shortness of breath, and discomfort in the chest radiating to the neck with associated fatigue and weakness. She was found to have a large pericardial effusion s/p pericardiocentesis on 8/7.  Assessment & Plan    1.  Pericardial Effusion w/ findings suggestive of possible tamponade physiology: s/p pericardiocentesis on 8/7 w/ 750 ml  of bloody fluid aspirated.  Culture w/o growth to date.  Path pending, may not be back until Monday. Concern for malignancy persists. Pericardial drain out, echo 8/10 with no pericardial effusion, moderate left pleural effusion.   2.  Elevated troponin/demand ischemia:  In setting of above.  EF nl w/o wma on echo.  Likely represents demand ischemia.  No chest pain.  No plan for ischemic eval @ this time.  3.  SVT:  No further SVT.  Continue bisoprolol.  4.  AECOPD:  Inhalers/nebs/steroids/abx per IM.  Sounds much better today, no wheezing.   Should be ok to transfer to telemetry.  We will followup again on Monday unless called.  Could potentially go home tomorrow but PCP will have to followup on pericardial fluid cytology.   Signed, Loralie Champagne, MD  03/23/2017, 12:14 PM

## 2017-03-23 NOTE — Progress Notes (Signed)
[place] at [place] NAME: Jeniyah Menor    MR#:  [id number]  DATE OF BIRTH:  Jan 20, 1956  SUBJECTIVE:  No chest pain, palpitation or shortness of breath. REVIEW OF SYSTEMS:    Review of Systems  Constitutional: Negative for fever, chills weight loss HENT: Negative for ear pain, nosebleeds, congestion, facial swelling, rhinorrhea, neck pain, neck stiffness and ear discharge.   Respiratory: no shortness of breath no wheezing or cough Cardiovascular: Negative for chest pain, palpitations and leg swelling.  Gastrointestinal: Negative for heartburn, abdominal pain, vomiting, diarrhea or consitpation Genitourinary: Negative for dysuria, urgency, frequency, hematuria Musculoskeletal: Negative for back pain or joint pain Neurological: Negative for dizziness, seizures, syncope, focal weakness,  numbness and headaches.  Hematological: Does not bruise/bleed easily.  Psychiatric/Behavioral: Negative for hallucinations, confusion, dysphoric mood DRUG ALLERGIES:   Allergies  Allergen Reactions  . Codeine Other (See Comments)    constipation  . Sulfa Antibiotics Other (See Comments)    Allergy as a child    VITALS:  Blood pressure 127/85, pulse 83, temperature 98.1 F (36.7 C), temperature source Oral, resp. rate (!) 26, height 5\' 3"  (1.6 m), weight 226 lb 3.1 oz (102.6 kg), SpO2 97 %.  PHYSICAL EXAMINATION:  Constitutional: Appears well-developed and well-nourished. No distress. HENT: Normocephalic. Marland Kitchen Oropharynx is clear and moist.  Eyes: Conjunctivae and EOM are normal. PERRLA, no scleral icterus.  Neck: Normal ROM. Neck supple. No JVD. No tracheal deviation. CVS: Tachycardia S1/S2 +, no murmurs, no gallops, no carotid bruit.  Pulmonary: Effort and breath sounds normal, no wheezing, no use of accessory muscles to breath. Abdominal: Soft. BS +,  no distension, tenderness, rebound or guarding.  Musculoskeletal: Normal range of motion. No edema and  no tenderness.  Neuro: Alert. CN 2-12 grossly intact. No focal deficits. Skin: Skin is warm and dry. No rash noted. Psychiatric: Normal mood and affect.  LABORATORY PANEL:   CBC  Recent Labs Lab 03/21/17 0900  WBC 6.7  HGB 12.4  HCT 37.1  PLT 239   ------------------------------------------------------------------------------------------------------------------  Chemistries   Recent Labs Lab 03/18/17 1449  03/22/17 2155  NA 136  < > 143  K 3.8  < > 3.2*  CL 104  < > 110  CO2 21*  < > 24  GLUCOSE 148*  < > 122*  BUN 18  < > 15  CREATININE 0.96  < > 0.82  CALCIUM 9.2  < > 9.0  MG  --   < > 2.2  AST 46*  --   --   ALT 60*  --   --   ALKPHOS 72  --   --   BILITOT 1.2  --   --   < > = values in this interval not displayed. ------------------------------------------------------------------------------------------------------------------  Cardiac Enzymes  Recent Labs Lab 03/18/17 2250 03/19/17 0445 03/19/17 1245  TROPONINI 0.07* 0.08* 0.35*   ------------------------------------------------------------------------------------------------------------------  RADIOLOGY:  No results found.   ASSESSMENT AND PLAN:   61 year old female with history of right breast cancer status post bilateral mastectomy in 2012 and hypothyroidism who presented with shortness of breath and found to have large pericardial effusion with signs of tamponade physiology and SVT.  1. Large pericardial effusion with tamponade. Patient is s/p pericardiocentesis with drain placed. Follow-up on fluid and final cytology results Follow up ECHO shows resolution of circumferential pericardial fluid. There is echogenic material near the apex abutting the RV suspicious for intrapericardial thrombus or other organized material.  Appreciate cardiology consult  Pericardial drain removed 8/9. Repeat limited echocardiogram no pericardial effusion, moderate left pleural effusion.   * Acute respiratory  failure with hypoxemia due to above. Improved. Off O2 [place]. NEB prn.  2. SVT in the setting of problem #1 This has improved after pericardiocentesis Continue metoprolol  3. Left pleural effusion and opacity It is not Community-acquired pneumonia. Procalcitonin <0.10.  Continue empiriczosyn as per intensivist team 4. Hypothyroid: Continue Synthroid 5 depression: Continue Cymbalta  6. History of gout: Continue colchine 7. Elevated troponin due to demand ischemia from large pericardial effusion. Patient was ruled out for ACS. No ischemic eval at this time.  Hypokalemia. Give potassium supplement. Follow up BMP.  Management plans discussed with the patient and she is in agreement.  CODE STATUS: full  TOTAL TIME TAKING CARE OF THIS PATIENT: 33 minutes.     POSSIBLE D/C 1-2 days, DEPENDING ON CLINICAL CONDITION.   Demetrios Loll M.D on 03/23/2017 at 4:29 PM  Between 7am to 6pm - Pager - [phone] After 6pm go to [email or web address] - password [email or web address] [facility]  Office  [phone]  CC: Primary care physician; Adin Hector, MD  Note: This dictation was prepared with Dragon dictation along with smaller phrase technology. Any transcriptional errors that result from this process are unintentional.

## 2017-03-24 LAB — BASIC METABOLIC PANEL
ANION GAP: 8 (ref 5–15)
BUN: 17 mg/dL (ref 6–20)
CHLORIDE: 107 mmol/L (ref 101–111)
CO2: 26 mmol/L (ref 22–32)
Calcium: 8.8 mg/dL — ABNORMAL LOW (ref 8.9–10.3)
Creatinine, Ser: 0.71 mg/dL (ref 0.44–1.00)
GFR calc Af Amer: >60 mL/min (ref >60)
GFR calc non Af Amer: >60 mL/min (ref >60)
Glucose, Bld: 129 mg/dL — ABNORMAL HIGH (ref 65–99)
POTASSIUM: 4 mmol/L (ref 3.5–5.1)
Sodium: 141 mmol/L (ref 135–145)

## 2017-03-24 LAB — CBC
HCT: 39.6 % (ref 35.0–47.0)
Hemoglobin: 13.2 g/dL (ref 12.0–16.0)
MCH: 31.6 pg (ref 26.0–34.0)
MCHC: 33.3 g/dL (ref 32.0–36.0)
MCV: 95 fL (ref 80.0–100.0)
PLATELETS: 313 10*3/uL (ref 150–440)
RBC: 4.17 MIL/uL (ref 3.80–5.20)
RDW: 13.3 % (ref 11.5–14.5)
WBC: 10.3 10*3/uL (ref 3.6–11.0)

## 2017-03-24 LAB — GLUCOSE, CAPILLARY
GLUCOSE-CAPILLARY: 101 mg/dL — AB (ref 65–99)
GLUCOSE-CAPILLARY: 87 mg/dL (ref 65–99)

## 2017-03-24 MED ORDER — PREDNISONE 20 MG PO TABS
40.0000 mg | ORAL_TABLET | Freq: Every day | ORAL | Status: DC
  Administered 2017-03-24: 40 mg via ORAL
  Filled 2017-03-24: qty 2

## 2017-03-24 NOTE — Progress Notes (Signed)
MD Aundra Dubin notified for discharge clearance. MD is ok with discharge home. I will continue to assess.

## 2017-03-24 NOTE — Progress Notes (Signed)
[place] at [place] was admitted to the [facility] on 03/18/2017 and Discharged  03/24/2017 and should be excused from work/school   for 14  days starting 03/18/2017 , follow-up PCP and cardiologist for further recommendation about returning to work.  Demetrios Loll M.D on 03/24/2017,at 2:01 PM  [place] at [facility]  [phone]

## 2017-03-24 NOTE — Discharge Summary (Addendum)
[facility] [place] NAME: Julie Irwin    MR#:  [id number]  DATE OF BIRTH:  05-Feb-1956  DATE OF ADMISSION:  03/18/2017   ADMITTING PHYSICIAN: Theodoro Grist, MD  DATE OF DISCHARGE: 03/24/2017  PRIMARY CARE PHYSICIAN: Adin Hector, MD   ADMISSION DIAGNOSIS:  Pericardial effusion [I31.3] Tachycardia [R00.0] Community acquired pneumonia of left lower lobe of lung ([place]) [J18.1] DISCHARGE DIAGNOSIS:  Principal Problem:   Pericardial effusion Active Problems:   Acute dyspnea   SVT (supraventricular tachycardia) (HCC)   Lactic acidosis   Leukocytosis   Cardiac tamponade  SECONDARY DIAGNOSIS:   Past Medical History:  Diagnosis Date  . Breast cancer ([place])   . H/O degenerative disc disease   . Hyperlipidemia   . Hypothyroidism   . Morbid obesity [facility])    HOSPITAL COURSE:   61 year old female with history of right breast cancer status post bilateral mastectomy in 2012 and hypothyroidism who presented with shortness of breath and found to have large pericardial effusion with signs of tamponade physiology and SVT.  1. Large pericardial effusion with tamponade. Patient is s/p pericardiocentesis with drain placed. Follow-up on fluid and final cytology results as outpatient. Follow up ECHO shows resolution of circumferential pericardial fluid. There is echogenic material near the apex abutting the RV suspicious for intrapericardial thrombus or other organized material.  Appreciate cardiology consult Pericardial drain removed 8/9. Repeat limited echocardiogram no pericardial effusion, moderate left pleural effusion.   * Acute respiratory failure with hypoxemia due to above. Improved. Off O2 [place]. NEB prn.  2. SVT in the setting of problem #1 This has improved after pericardiocentesis Continue metoprolol  3. Left pleural effusion and opacity It is not Community-acquired pneumonia. Procalcitonin <0.10.  discontinue  empiriczosyn. 4. Hypothyroid: Continue Synthroid 5 depression: Continue Cymbalta  6. History of gout: Continue colchine 7. Elevated troponin due to demand ischemia from large pericardial effusion. Patient was ruled out for ACS. No ischemic eval at this time.  Hypokalemia. Improved with potassium supplement.  DISCHARGE CONDITIONS:  Stable, discharge to home today. CONSULTS OBTAINED:  Treatment Team:  Wellington Hampshire, MD DRUG ALLERGIES:   Allergies  Allergen Reactions  . Codeine Other (See Comments)    constipation  . Sulfa Antibiotics Other (See Comments)    Allergy as a child   DISCHARGE MEDICATIONS:   Allergies as of 03/24/2017      Reactions   Codeine Other (See Comments)   constipation   Sulfa Antibiotics Other (See Comments)   Allergy as a child      Medication List    STOP taking these medications   aspirin EC 81 MG tablet     TAKE these medications   alendronate 70 MG tablet Commonly known as:  FOSAMAX TAKE 1 TABLET BY MOUTH  EVERY WEEK   azelastine 0.1 % nasal spray Commonly known as:  ASTELIN instill 1 spray into each nostril twice a day for 7 days then once daily if needed   cyclobenzaprine 10 MG tablet Commonly known as:  FLEXERIL Take 10 mg by mouth 3 (three) times daily as needed.   DULoxetine 30 MG capsule Commonly known as:  CYMBALTA 90 mg.   fluticasone 110 MCG/ACT inhaler Commonly known as:  FLOVENT HFA Inhale 1 puff into the lungs 2 (two) times daily.   levothyroxine 137 MCG tablet Commonly known as:  SYNTHROID, LEVOTHROID Take 137 mcg by mouth daily before breakfast.   metoprolol succinate 25 MG 24 hr  tablet Commonly known as:  TOPROL-XL Take 75 mg by mouth daily.   topiramate 50 MG tablet Commonly known as:  TOPAMAX Take 50 mg by mouth daily.   traMADol 50 MG tablet Commonly known as:  ULTRAM Take 25-50 mg by mouth every 6 (six) hours as needed.   Vitamin D3 2000 units capsule Take 2,000 Units by mouth daily.         DISCHARGE INSTRUCTIONS:  See AVS.  If you experience worsening of your admission symptoms, develop shortness of breath, life threatening emergency, suicidal or homicidal thoughts you must seek medical attention immediately by calling 911 or calling your MD immediately  if symptoms less severe.  You Must read complete instructions/literature along with all the possible adverse reactions/side effects for all the Medicines you take and that have been prescribed to you. Take any new Medicines after you have completely understood and accpet all the possible adverse reactions/side effects.   Please note  You were cared for by a hospitalist during your hospital stay. If you have any questions about your discharge medications or the care you received while you were in the hospital after you are discharged, you can call the unit and asked to speak with the hospitalist on call if the hospitalist that took care of you is not available. Once you are discharged, your primary care physician will handle any further medical issues. Please note that NO REFILLS for any discharge medications will be authorized once you are discharged, as it is imperative that you return to your primary care physician (or establish a relationship with a primary care physician if you do not have one) for your aftercare needs so that they can reassess your need for medications and monitor your lab values.    On the day of Discharge:  VITAL SIGNS:  Blood pressure (!) 137/94, pulse 71, temperature 98.2 F (36.8 C), temperature source Oral, resp. rate 18, height 5\' 3"  (1.6 m), weight 221 lb 1.6 oz (100.3 kg), SpO2 100 %. PHYSICAL EXAMINATION:  GENERAL:  61 y.o.-year-old patient lying in the bed with no acute distress. Obese. EYES: Pupils equal, round, reactive to light and accommodation. No scleral icterus. Extraocular muscles intact.  HEENT: Head atraumatic, normocephalic. Oropharynx and nasopharynx clear.  NECK:  Supple, no  jugular venous distention. No thyroid enlargement, no tenderness.  LUNGS: Normal breath sounds bilaterally, no wheezing, rales,rhonchi or crepitation. No use of accessory muscles of respiration.  CARDIOVASCULAR: S1, S2 normal. No murmurs, rubs, or gallops.  ABDOMEN: Soft, non-tender, non-distended. Bowel sounds present. No organomegaly or mass.  EXTREMITIES: No pedal edema, cyanosis, or clubbing.  NEUROLOGIC: Cranial nerves II through XII are intact. Muscle strength 5/5 in all extremities. Sensation intact. Gait not checked.  PSYCHIATRIC: The patient is alert and oriented x 3.  SKIN: No obvious rash, lesion, or ulcer.  DATA REVIEW:   CBC  Recent Labs Lab 03/24/17 0542  WBC 10.3  HGB 13.2  HCT 39.6  PLT 313    Chemistries   Recent Labs Lab 03/18/17 1449  03/22/17 2155 03/24/17 0542  NA 136  < > 143 141  K 3.8  < > 3.2* 4.0  CL 104  < > 110 107  CO2 21*  < > 24 26  GLUCOSE 148*  < > 122* 129*  BUN 18  < > 15 17  CREATININE 0.96  < > 0.82 0.71  CALCIUM 9.2  < > 9.0 8.8*  MG  --   < > 2.2  --  AST 46*  --   --   --   ALT 60*  --   --   --   ALKPHOS 72  --   --   --   BILITOT 1.2  --   --   --   < > = values in this interval not displayed.   Microbiology Results  Results for orders placed or performed during the hospital encounter of 03/18/17  Culture, blood (routine x 2)     Status: None   Collection Time: 03/18/17  3:55 PM  Result Value Ref Range Status   Specimen Description BLOOD LEFT ANTECUBITAL  Final   Special Requests   Final    BOTTLES DRAWN AEROBIC AND ANAEROBIC Blood Culture adequate volume   Culture NO GROWTH 5 DAYS  Final   Report Status 03/23/2017 FINAL  Final  MRSA PCR Screening     Status: None   Collection Time: 03/18/17 11:18 PM  Result Value Ref Range Status   MRSA by PCR NEGATIVE NEGATIVE Final    Comment:        The GeneXpert MRSA Assay (FDA approved for NASAL specimens only), is one component of a comprehensive MRSA  colonization surveillance program. It is not intended to diagnose MRSA infection nor to guide or monitor treatment for MRSA infections.   Body fluid culture     Status: None   Collection Time: 03/19/17 11:40 AM  Result Value Ref Range Status   Specimen Description PERICARDIAL  Final   Special Requests Normal  Final   Gram Stain   Final    RARE WBC PRESENT, PREDOMINANTLY MONONUCLEAR NO ORGANISMS SEEN    Culture   Final    NO GROWTH 3 DAYS Performed at [facility], [address], [place], [place] [address]    Report Status 03/23/2017 FINAL  Final  Culture, expectorated sputum-assessment     Status: None   Collection Time: 03/20/17 11:19 AM  Result Value Ref Range Status   Specimen Description SPUTUM  Final   Special Requests NONE  Final   Sputum evaluation THIS SPECIMEN IS ACCEPTABLE FOR SPUTUM CULTURE  Final   Report Status 03/20/2017 FINAL  Final  Culture, respiratory (NON-Expectorated)     Status: None   Collection Time: 03/20/17 11:19 AM  Result Value Ref Range Status   Specimen Description SPUTUM  Final   Special Requests NONE Reflexed from [id number]  Final   Gram Stain   Final    MODERATE WBC PRESENT, PREDOMINANTLY PMN FEW GRAM POSITIVE COCCI IN PAIRS IN CLUSTERS FEW GRAM NEGATIVE RODS RARE GRAM POSITIVE RODS    Culture   Final    Consistent with normal respiratory flora. Performed at [facility], [place] [address] [place], [place] [address]    Report Status 03/22/2017 FINAL  Final    RADIOLOGY:  No results found.   Management plans discussed with the patient, family and they are in agreement.  CODE STATUS: Full Code   TOTAL TIME TAKING CARE OF THIS PATIENT: 33 minutes.    Demetrios Loll M.D on 03/24/2017 at 12:37 PM  Between 7am to 6pm - Pager - [phone]  After 6pm go to [email or web address] - password [email or web address] [facility]  [facility] [place]  Office  [phone]  CC: Primary care physician; Adin Hector, MD   Note: This  dictation was prepared with Dragon dictation along with smaller phrase technology. Any transcriptional errors that result from this process are unintentional.

## 2017-03-24 NOTE — Discharge Instructions (Signed)
Follow up PCP and Dr. Fletcher Anon in  1 week.

## 2017-03-25 ENCOUNTER — Encounter: Payer: Self-pay | Admitting: Internal Medicine

## 2017-03-25 NOTE — Progress Notes (Signed)
Follow-up Outpatient Visit Date: 03/26/2017  Primary Care Provider: Adin Hector, MD [place] [address]  Chief Complaint: Follow-up from recent hospitalization for pericardial effusion  HPI:  Julie Irwin is a 61 y.o. year-old female with history of large pericardial effusion with tamponade physiology status post urgent pericardiocentesis last week complicated by paroxysmal SVT, remote breast cancer, obesity, hypothyroidism, and hyperlipidemia who presents for follow-up of pericardial effusion. Today, Julie Irwin reports feeling relatively well. She has some fatigue and exertional dyspnea, though Julie is gradually improving. She has not had any significant chest pain. In fact, she notes that after leaving the hospital, she was able to lie back in her adjustable bed without any orthopnea or chest wall pain. Julie is the first time she has been without the symptoms in years. She notes that the pericardial drain site has not been painful. She denies drainage. She has not had any significant edema. She notes occasional "jitteriness" but has not had any noticeable palpitations or lightheadedness. She remains compliant with her medications, including metoprolol.  --------------------------------------------------------------------------------------------------  Past Medical History:  Diagnosis Date  . Breast cancer ([place])   . H/O degenerative disc disease   . Hyperlipidemia   . Hypothyroidism   . Morbid obesity ([place])   . Pericardial effusion 03/19/2017   Required urgent pericardiocentesis with removal of 700 mL of bloody fluid   Past Surgical History:  Procedure Laterality Date  . MASTECTOMY Bilateral   . PERICARDIOCENTESIS N/A 03/19/2017   Procedure: PERICARDIOCENTESIS;  Surgeon: Nelva Bush, MD;  Location: [facility];  Service: Cardiovascular;  Laterality: N/A;  . PLACEMENT OF BREAST IMPLANTS Bilateral     Past medical and surgical  history were reviewed and updated in EPIC.  Current Meds  Medication Sig  . alendronate (FOSAMAX) 70 MG tablet TAKE 1 TABLET BY MOUTH  EVERY WEEK  . azelastine (ASTELIN) 0.1 % nasal spray instill 1 spray into each nostril twice a day for 7 days then once daily if needed  . Cholecalciferol (VITAMIN D3) 2000 UNITS capsule Take 2,000 Units by mouth daily.   . cyclobenzaprine (FLEXERIL) 10 MG tablet Take 10 mg by mouth 3 (three) times daily as needed.   . DULoxetine (CYMBALTA) 30 MG capsule 90 mg.   . fluticasone (FLOVENT HFA) 110 MCG/ACT inhaler Inhale 1 puff into the lungs 2 (two) times daily.  Marland Kitchen levothyroxine (SYNTHROID, LEVOTHROID) 137 MCG tablet Take 137 mcg by mouth daily before breakfast.   . metoprolol succinate (TOPROL-XL) 25 MG 24 hr tablet Take 75 mg by mouth daily.   Marland Kitchen topiramate (TOPAMAX) 50 MG tablet Take 50 mg by mouth daily.   . traMADol (ULTRAM) 50 MG tablet Take 25-50 mg by mouth every 6 (six) hours as needed.     Allergies: Codeine and Sulfa antibiotics  Social History   Social History  . Marital status: Married    Spouse name: N/A  . Number of children: N/A  . Years of education: N/A   Occupational History  . Not on file.   Social History Main Topics  . Smoking status: Never Smoker  . Smokeless tobacco: Never Used  . Alcohol use No  . Drug use: No  . Sexual activity: Not on file   Other Topics Concern  . Not on file   Social History Narrative  . No narrative on file    Family History  Problem Relation Age of Onset  . Colon polyps Father  Review of Systems: A 12-system review of systems was performed and was negative except as noted in the HPI.  --------------------------------------------------------------------------------------------------  Physical Exam: BP 98/62 (BP Location: Left Arm, Patient Position: Sitting, Cuff Size: Normal)   Pulse 76   Ht 5\' 3"  (1.6 m)   Wt 218 lb (98.9 kg)   BMI 38.62 kg/m   General:  Obese woman, seated  comfortably in the exam room. HEENT: No conjunctival pallor or scleral icterus. Moist mucous membranes.  OP clear. Neck: Supple without lymphadenopathy, thyromegaly, JVD, or HJR. No carotid bruit. Lungs: Normal work of breathing. Mildly diminished breath sounds at the bases. No wheezes or crackles.  Heart: Distant heart sounds. Regular rate and rhythm without murmurs, rubs, or gallops. Non-displaced PMI. Abd: Bowel sounds present. Soft, NT/ND without hepatosplenomegaly Ext: No lower extremity edema. Radial, PT, and DP pulses are 2+ bilaterally. Skin: Warm and dry without rash. Left chest wall pericardial drain site is well-healed without drainage.  EKG:  Normal sinus rhythm, possible left atrial enlargement, and anterolateral ST depressions and T-wave inversions, more pronounced than on prior tracing last week.  Lab Results  Component Value Date   WBC 10.3 03/24/2017   HGB 13.2 03/24/2017   HCT 39.6 03/24/2017   MCV 95.0 03/24/2017   PLT 313 03/24/2017    Lab Results  Component Value Date   NA 141 03/24/2017   K 4.0 03/24/2017   CL 107 03/24/2017   CO2 26 03/24/2017   BUN 17 03/24/2017   CREATININE 0.71 03/24/2017   GLUCOSE 129 (H) 03/24/2017   ALT 60 (H) 03/18/2017    No results found for: CHOL, HDL, LDLCALC, LDLDIRECT, TRIG, CHOLHDL  --------------------------------------------------------------------------------------------------  ASSESSMENT AND PLAN: Malignant pericardial effusion Symptomatically, Julie Irwin has improved considerably since her presentation last week. We are awaiting final pathology results, though preliminary report from cytology is consistent with malignant cells. Hopefully, the final report will be available tomorrow. We have arranged for Julie Irwin to be seen by Dr. Grayland Ormond, her oncologist, later Julie week to discuss these findings and further workup/treatment. I will obtain a limited echo in one week to ensure that her effusion is not reaccumulating. If  there is evidence of significant pericardial fluid, we will need to refer Julie Irwin for a pericardial window.  Abnormal EKG and recent troponin elevation Julie Irwin denies angina. Her shortness of breath is likely multifactorial and has actually improved with pericardiocentesis. EKG today demonstrates anterolateral ST/T changes, more pronounced than during her recent hospitalization. Troponin was mildly elevated during her recent hospitalization, felt to be secondary to supply-demand mismatch. We have agreed to obtain a pharmacologic myocardial perfusion stress test to exclude significant ischemia.  Paroxysmal supraventricular tachycardia Runs of SVT were noted during recent hospitalization. Sinus rhythm is evident today. Julie Irwin denies frank palpitations since leaving the hospital. We will continue her current dose of metoprolol.  Follow-up: Return to clinic in 6 weeks.  Nelva Bush, MD 03/26/2017 1:43 PM

## 2017-03-26 ENCOUNTER — Ambulatory Visit (INDEPENDENT_AMBULATORY_CARE_PROVIDER_SITE_OTHER): Payer: 59 | Admitting: Internal Medicine

## 2017-03-26 ENCOUNTER — Encounter: Payer: Self-pay | Admitting: Internal Medicine

## 2017-03-26 VITALS — BP 98/62 | HR 76 | Ht 63.0 in | Wt 218.0 lb

## 2017-03-26 DIAGNOSIS — C801 Malignant (primary) neoplasm, unspecified: Secondary | ICD-10-CM

## 2017-03-26 DIAGNOSIS — R778 Other specified abnormalities of plasma proteins: Secondary | ICD-10-CM

## 2017-03-26 DIAGNOSIS — I318 Other specified diseases of pericardium: Secondary | ICD-10-CM

## 2017-03-26 DIAGNOSIS — R9431 Abnormal electrocardiogram [ECG] [EKG]: Secondary | ICD-10-CM | POA: Diagnosis not present

## 2017-03-26 DIAGNOSIS — R7989 Other specified abnormal findings of blood chemistry: Secondary | ICD-10-CM

## 2017-03-26 DIAGNOSIS — C50919 Malignant neoplasm of unspecified site of unspecified female breast: Secondary | ICD-10-CM | POA: Diagnosis not present

## 2017-03-26 DIAGNOSIS — R748 Abnormal levels of other serum enzymes: Secondary | ICD-10-CM | POA: Diagnosis not present

## 2017-03-26 DIAGNOSIS — I471 Supraventricular tachycardia: Secondary | ICD-10-CM

## 2017-03-26 DIAGNOSIS — I3131 Malignant pericardial effusion in diseases classified elsewhere: Secondary | ICD-10-CM

## 2017-03-26 DIAGNOSIS — I313 Pericardial effusion (noninflammatory): Principal | ICD-10-CM

## 2017-03-26 NOTE — Patient Instructions (Addendum)
Medication Instructions:  Your physician recommends that you continue on your current medications as directed. Please refer to the Current Medication list given to you today.   Labwork: none  Testing/Procedures: Your physician has requested that you have an LIMITED echocardiogram IN 1 WEEK. Echocardiography is a painless test that uses sound waves to create images of your heart. It provides your doctor with information about the size and shape of your heart and how well your heart's chambers and valves are working. This procedure takes approximately one hour. There are no restrictions for this procedure.    Your physician has requested that you have a lexiscan myoview. For further information please visit [email or web address]. Please follow instruction sheet, as given.    [place]  Your caregiver has ordered a Stress Test with nuclear imaging. The purpose of this test is to evaluate the blood supply to your heart muscle. This procedure is referred to as a "Non-Invasive Stress Test." This is because other than having an IV started in your vein, nothing is inserted or "invades" your body. Cardiac stress tests are done to find areas of poor blood flow to the heart by determining the extent of coronary artery disease (CAD). Some patients exercise on a treadmill, which naturally increases the blood flow to your heart, while others who are  unable to walk on a treadmill due to physical limitations have a pharmacologic/chemical stress agent called Lexiscan . This medicine will mimic walking on a treadmill by temporarily increasing your coronary blood flow.   Please note: these test may take anywhere between 2-4 hours to complete  PLEASE REPORT TO [place] AT THE FIRST DESK WILL DIRECT YOU WHERE TO GO  Date of Procedure:_______08/17/18__________  Arrival Time for Procedure:______08:45 AM______________  Instructions regarding medication:   _X__:   Hold betablocker(METOPROLOL) THE night before procedure and morning of procedure    PLEASE NOTIFY THE OFFICE AT LEAST 24 HOURS IN ADVANCE IF YOU ARE UNABLE TO KEEP YOUR APPOINTMENT.  [phone] AND  PLEASE NOTIFY NUCLEAR MEDICINE AT [facility] AT LEAST 24 HOURS IN ADVANCE IF YOU ARE UNABLE TO KEEP YOUR APPOINTMENT. [phone]  How to prepare for your Myoview test:  1. Do not eat or drink after midnight 2. No caffeine for 24 hours prior to test 3. No smoking 24 hours prior to test. 4. Your medication may be taken with water.  If your doctor stopped a medication because of this test, do not take that medication. 5. Ladies, please do not wear dresses.  Skirts or pants are appropriate. Please wear a short sleeve shirt. 6. No perfume, cologne or lotion. 7. Wear comfortable walking shoes. No heels!        Follow-Up: Your physician recommends that you schedule a follow-up appointment WITH DR Grayland Ormond ON 03/28/17 AT 10:00 AM.  Your physician recommends that you schedule a follow-up appointment in: [place].   If you need a refill on your cardiac medications before your next appointment, please call your pharmacy.   Echocardiogram An echocardiogram, or echocardiography, uses sound waves (ultrasound) to produce an image of your heart. The echocardiogram is simple, painless, obtained within a short period of time, and offers valuable information to your health care provider. The images from an echocardiogram can provide information such as:  Evidence of coronary artery disease (CAD).  Heart size.  Heart muscle function.  Heart valve function.  Aneurysm detection.  Evidence of a past heart attack.  Fluid buildup  around the heart.  Heart muscle thickening.  Assess heart valve function.  Tell a health care provider about:  Any allergies you have.  All medicines you are taking, including vitamins, herbs, eye drops, creams, and over-the-counter medicines.  Any  problems you or family members have had with anesthetic medicines.  Any blood disorders you have.  Any surgeries you have had.  Any medical conditions you have.  Whether you are pregnant or may be pregnant. What happens before the procedure? No special preparation is needed. Eat and drink normally. What happens during the procedure?  In order to produce an image of your heart, gel will be applied to your chest and a wand-like tool (transducer) will be moved over your chest. The gel will help transmit the sound waves from the transducer. The sound waves will harmlessly bounce off your heart to allow the heart images to be captured in real-time motion. These images will then be recorded.  You may need an IV to receive a medicine that improves the quality of the pictures. What happens after the procedure? You may return to your normal schedule including diet, activities, and medicines, unless your health care provider tells you otherwise. This information is not intended to replace advice given to you by your health care provider. Make sure you discuss any questions you have with your health care provider. Document Released: 07/27/2000 Document Revised: 03/17/2016 Document Reviewed: 04/06/2013 Elsevier Interactive Patient Education  2017 [place].    Cardiac Nuclear Scan A cardiac nuclear scan is a test that measures blood flow to the heart when a person is resting and when he or she is exercising. The test looks for problems such as:  Not enough blood reaching a portion of the heart.  The heart muscle not working normally.  You may need this test if:  You have heart disease.  You have had abnormal lab results.  You have had heart surgery or angioplasty.  You have chest pain.  You have shortness of breath.  In this test, a radioactive dye (tracer) is injected into your bloodstream. After the tracer has traveled to your heart, an imaging device is used to measure how much  of the tracer is absorbed by or distributed to various areas of your heart. This procedure is usually done at a hospital and takes 2-4 hours. Tell a health care provider about:  Any allergies you have.  All medicines you are taking, including vitamins, herbs, eye drops, creams, and over-the-counter medicines.  Any problems you or family members have had with the use of anesthetic medicines.  Any blood disorders you have.  Any surgeries you have had.  Any medical conditions you have.  Whether you are pregnant or may be pregnant. What are the risks? Generally, this is a safe procedure. However, problems may occur, including:  Serious chest pain and heart attack. This is only a risk if the stress portion of the test is done.  Rapid heartbeat.  Sensation of warmth in your chest. This usually passes quickly.  What happens before the procedure?  Ask your health care provider about changing or stopping your regular medicines. This is especially important if you are taking diabetes medicines or blood thinners.  Remove your jewelry on the day of the procedure. What happens during the procedure?  An IV tube will be inserted into one of your veins.  Your health care provider will inject a small amount of radioactive tracer through the tube.  You will wait for  20-40 minutes while the tracer travels through your bloodstream.  Your heart activity will be monitored with an electrocardiogram (ECG).  You will lie down on an exam table.  Images of your heart will be taken for about 15-20 minutes.  You may be asked to exercise on a treadmill or stationary bike. While you exercise, your heart's activity will be monitored with an ECG, and your blood pressure will be checked. If you are unable to exercise, you may be given a medicine to increase blood flow to parts of your heart.  When blood flow to your heart has peaked, a tracer will again be injected through the IV tube.  After 20-40  minutes, you will get back on the exam table and have more images taken of your heart.  When the procedure is over, your IV tube will be removed. The procedure may vary among health care providers and hospitals. Depending on the type of tracer used, scans may need to be repeated 3-4 hours later. What happens after the procedure?  Unless your health care provider tells you otherwise, you may return to your normal schedule, including diet, activities, and medicines.  Unless your health care provider tells you otherwise, you may increase your fluid intake. This will help flush the contrast dye from your body. Drink enough fluid to keep your urine clear or pale yellow.  It is up to you to get your test results. Ask your health care provider, or the department that is doing the test, when your results will be ready. Summary  A cardiac nuclear scan measures the blood flow to the heart when a person is resting and when he or she is exercising.  You may need this test if you are at risk for heart disease.  Tell your health care provider if you are pregnant.  Unless your health care provider tells you otherwise, increase your fluid intake. This will help flush the contrast dye from your body. Drink enough fluid to keep your urine clear or pale yellow. This information is not intended to replace advice given to you by your health care provider. Make sure you discuss any questions you have with your health care provider. Document Released: 08/24/2004 Document Revised: 08/01/2016 Document Reviewed: 07/08/2013 Elsevier Interactive Patient Education  2017 [employer].

## 2017-03-27 NOTE — Progress Notes (Signed)
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  INTERVAL HISTORY: Patient returns to clinic today for further evaluation and discussion of her pathology results. She recently was admitted to the hospital with increasing shortness of breath and found to have cardiac tamponade. Subsequent pericardiocentesis revealed a malignant effusion suggesting a recurrence of her breast cancer. She is highly anxious, but otherwise feels well. She has no further shortness of breath.  She has no neurologic complaints. She has a good appetite and has maintained her weight.  She has no chest pain, cough, or hemoptysis.  She has no nausea, vomiting, constipation, or diarrhea.  Patient offers no specific complaints today.  REVIEW OF SYSTEMS:   Review of Systems  Constitutional: Negative.  Negative for fever, malaise/fatigue and weight loss.  Respiratory: Negative.  Negative for cough and shortness of breath.   Cardiovascular: Negative.  Negative for chest pain and leg swelling.  Gastrointestinal: Negative.  Negative for abdominal pain.  Genitourinary: Negative.   Musculoskeletal: Negative.   Skin: Negative.  Negative for rash.  Neurological: Negative.  Negative for sensory change and weakness.  Psychiatric/Behavioral: The patient is nervous/anxious.    As per HPI. Otherwise, a complete review of systems is negative.   PAST MEDICAL HISTORY:  Hypothyroidism, migraines, depression, asthma.  PAST SURGICAL HISTORY: Bilateral mastectomy with reconstruction, partial hysterectomy.  FAMILY HISTORY: Lung cancer, melanoma, stomach cancer.  Also  diabetes, CAD, hypertension     ADVANCED DIRECTIVES:    HEALTH MAINTENANCE: Social History  Substance Use Topics  . Smoking status: Never Smoker  . Smokeless tobacco: Never Used  . Alcohol use No     Colonoscopy:  PAP:  Bone density:  Lipid panel:  Allergies  Allergen Reactions  . Codeine Other (See Comments)    constipation  . Sulfa Antibiotics Other (See Comments)    Allergy as a child    Current Outpatient Prescriptions  Medication Sig Dispense Refill  . alendronate (FOSAMAX) 70 MG tablet TAKE 1 TABLET BY MOUTH  EVERY WEEK 12 tablet 3  . azelastine (ASTELIN) 0.1 % nasal spray instill 1 spray into each nostril twice a day for 7 days then once daily if needed  0  . calcium-vitamin D (OSCAL WITH D) 250-125 MG-UNIT tablet Take 1 tablet by mouth daily.    . cyclobenzaprine (FLEXERIL) 10 MG tablet Take 10 mg by mouth 3 (three) times daily as needed.     . DULoxetine (CYMBALTA) 30 MG capsule 90 mg.     . fluticasone (FLOVENT HFA) 110 MCG/ACT inhaler Inhale 1 puff into the lungs 2 (two) times daily.    Marland Kitchen levothyroxine (SYNTHROID, LEVOTHROID) 137 MCG tablet Take 137 mcg by mouth daily before breakfast.     . metoprolol succinate (TOPROL-XL) 25 MG 24 hr tablet Take 75 mg by mouth daily.     . Multiple Vitamin (MULTIVITAMIN) tablet Take 1 tablet by mouth daily.    . prochlorperazine (COMPAZINE) 10 MG tablet Take 10 mg by mouth every 6 (six) hours as needed for nausea or vomiting.    . topiramate (TOPAMAX) 50 MG tablet Take 50 mg by mouth daily.     . traMADol (ULTRAM) 50 MG  tablet Take 25-50 mg by mouth every 6 (six) hours as needed.      No current facility-administered medications for this visit.     OBJECTIVE: Vitals:   03/28/17 1013  BP: 107/74  Pulse: 78  Resp: 18  Temp: 97.7 F (36.5 C)     Body mass index is 38.56 kg/m.    ECOG FS:0 - Asymptomatic  General: Well-developed, well-nourished, no acute distress. Eyes: anicteric sclera. Breasts: Patient has  bilateral mastectomies with reconstruction, no obvious evidence of local recurrence. Lungs: Clear to auscultation bilaterally. Heart: Regular rate and rhythm. No rubs, murmurs, or gallops. Abdomen: Soft, nontender, nondistended. No organomegaly noted, normoactive bowel sounds. Musculoskeletal: No edema, cyanosis, or clubbing. Neuro: Alert, answering all questions appropriately. Cranial nerves grossly intact. Skin: No rashes or petechiae noted. Psych: Normal affect.   LAB RESULTS:  Lab Results  Component Value Date   NA 141 03/24/2017   K 4.0 03/24/2017   CL 107 03/24/2017   CO2 26 03/24/2017   GLUCOSE 129 (H) 03/24/2017   BUN 17 03/24/2017   CREATININE 0.71 03/24/2017   CALCIUM 8.8 (L) 03/24/2017   PROT 6.5 03/19/2017   ALBUMIN 3.9 03/18/2017   AST 46 (H) 03/18/2017   ALT 60 (H) 03/18/2017   ALKPHOS 72 03/18/2017   BILITOT 1.2 03/18/2017   GFRNONAA >60 03/24/2017   GFRAA >60 03/24/2017    Lab Results  Component Value Date   WBC 10.3 03/24/2017   NEUTROABS 9.3 (H) 03/18/2017   HGB 13.2 03/24/2017   HCT 39.6 03/24/2017   MCV 95.0 03/24/2017   PLT 313 03/24/2017     STUDIES: Ct Angio Chest Pe W Or Wo Contrast  Result Date: 03/18/2017 CLINICAL DATA:  Tachycardia and dyspnea. EXAM: CT ANGIOGRAPHY CHEST WITH CONTRAST TECHNIQUE: Multidetector CT imaging of the chest was performed using the standard protocol during bolus administration of intravenous contrast. Multiplanar CT image reconstructions and MIPs were obtained to evaluate the vascular anatomy. CONTRAST:  75 cc Isovue 370 IV COMPARISON:  Same day CXR FINDINGS: Cardiovascular: Large pericardial effusion, anteriorly measuring up to 1.9 cm in thickness, 2.9 cm in thickness at the cardiac apex and posteriorly at 2.2 cm in thickness. The heart is otherwise normal in size. There is good opacification of the pulmonary arteries. No pulmonary embolus is identified. No aortic aneurysm or dissection. Minimal aortic atherosclerosis.  Mediastinum/Nodes: No enlarged mediastinal, hilar, or axillary lymph nodes. Thyroid gland, trachea, and esophagus demonstrate no significant findings. Lungs/Pleura: Bilateral small to moderate pleural effusions with adjacent atelectasis. No dominant mass. Faint bilateral ground-glass opacities may represent stigmata of pulmonary edema or hypoventilatory change among some possibilities though not exclusive. Upper Abdomen: Bilateral saline subglandular implants with mild bilateral skin thickening and soft tissue induration about the left breast. Clinical correlation recommended. Musculoskeletal: Dextroscoliosis of the thoracic spine without acute nor suspicious osseous abnormality. Review of the MIP images confirms the above findings. IMPRESSION: 1. Large pericardial effusion.  Cardiology assessment recommended. 2. Small bilateral pleural effusions with atelectasis and ground-glass opacities likely representing stigmata of CHF. 3. No acute pulmonary embolus. 4. Bilateral skin thickening and subcutaneous fatty induration of the left breast with underlying bilateral saline subglandular implants. Clinical correlation recommended. Aortic Atherosclerosis (ICD10-I70.0). Electronically Signed   By: Ashley Royalty M.D.   On: 03/18/2017 20:09   Dg Chest Port 1 View  Result Date: 03/21/2017 CLINICAL DATA:  61 year old female with shortness of Breath. Pericardiocentesis 2 days ago. Pericardial and pleural effusions. EXAM: PORTABLE CHEST 1 VIEW COMPARISON:  03/19/2017 and earlier. FINDINGS: Portable AP upright view at 0808 hours. Bilateral breast implants incidentally noted. Continued veiling opacity at the left lung base and indistinct appearance of the lower heart border. A drainage catheter continues to project over the cardiac silhouette and appears stable. Small right pleural effusion appears stable. Decreased pulmonary vascularity. No pneumothorax. Other mediastinal contours are stable. Visualized tracheal air column is  within normal limits. IMPRESSION: 1. Stable pericardial catheter and cardiac size since 03/19/2017. 2. Decreased pulmonary vascularity, no acute edema. 3. Continued confluent opacity at the left lung base, probably a combination of pleural effusion and atelectasis or consolidation. 4. No new cardiopulmonary abnormality. Electronically Signed   By: Genevie Ann M.D.   On: 03/21/2017 08:48   Dg Chest Port 1 View  Result Date: 03/19/2017 CLINICAL DATA:  Chest pain EXAM: PORTABLE CHEST 1 VIEW COMPARISON:  03/18/2017 chest radiograph. FINDINGS: Pericardial drain terminates over the right heart. Mildly enlarged cardiopericardial silhouette, slightly decreased in size. Otherwise stable mediastinal contour. No pneumothorax. Stable trace right and small left pleural effusions. No overt pulmonary edema. Patchy bibasilar lung opacities, left greater than right, stable. IMPRESSION: 1. No pneumothorax. 2. Stable small left and trace right pleural effusions and left greater than right bibasilar lung opacities. 3. Pericardial drain terminates over the right heart. Mildly enlarged cardiopericardial silhouette, slightly decreased. Electronically Signed   By: Ilona Sorrel M.D.   On: 03/19/2017 13:18   Dg Chest Port 1 View  Result Date: 03/18/2017 CLINICAL DATA:  Tachycardia and pain EXAM: PORTABLE CHEST 1 VIEW COMPARISON:  None. FINDINGS: Cardiac shadow is enlarged. Increased density is noted over the left base in part related to overlying soft tissue although some effusion and infiltrate is likely present as well. No acute bony abnormality is seen. IMPRESSION: Increased density over the left base likely representing a combination of effusion and infiltrate. Two-view chest is recommended when the patient's condition allows. Electronically Signed   By: Inez Catalina M.D.   On: 03/18/2017 15:07    ASSESSMENT: Initially stage Ia, ER positive, PR negative, HER-2 overexpressing adenocarcinoma of the upper outer quadrant of the right  breast, now with likely recurrence in pericardial fluid.   PLAN:    1. 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 They are GATA-3 positive suggesting breast cancer. Patient's CA-27-29 is now mildly elevated at 65.6. Although her biomarkers are different than her original diagnosis, this is likely a recurrence of her breast cancer. She will require a PET scan for staging purposes as well as to assess for any other evidence of recurrence. She may require an additional biopsy to obtain more tissue for pathology testing. Will also get an MRI of the brain for completeness. Patient will likely require chemotherapy therefore a referral has been sent to surgery for consideration of port placement. Return to clinic in 1 week to discuss her imaging results and treatment planning.  2. Osteoporosis: Patient's most recent T score on August 30, 2015 was significantly improved with a T score of -1.5. Previously her T score was reported at -2.6. Continue alendronate, calcium, and vitamin D.  Now the patient has discontinued letrozole, her bone mineral densities can now be ordered by her primary care physician.  Patient expressed understanding and was in agreement with this plan. She also  understands that She can call clinic at any time with any questions, concerns, or complaints.   Approximately 30 minutes was spent in discussion of which greater than 50% was consultation.   Breast cancer   Staging form: Breast, AJCC 7th Edition     Clinical stage from 02/14/2015: Stage IA (T1c, N0, M0) - Signed by Lloyd Huger, MD on 02/14/2015   Lloyd Huger, MD   03/29/2017 12:36 PM

## 2017-03-28 ENCOUNTER — Inpatient Hospital Stay: Payer: 59 | Attending: Oncology | Admitting: Oncology

## 2017-03-28 VITALS — BP 107/74 | HR 78 | Temp 97.7°F | Resp 18 | Wt 217.7 lb

## 2017-03-28 DIAGNOSIS — Z800 Family history of malignant neoplasm of digestive organs: Secondary | ICD-10-CM | POA: Diagnosis not present

## 2017-03-28 DIAGNOSIS — J90 Pleural effusion, not elsewhere classified: Secondary | ICD-10-CM | POA: Insufficient documentation

## 2017-03-28 DIAGNOSIS — F419 Anxiety disorder, unspecified: Secondary | ICD-10-CM | POA: Diagnosis not present

## 2017-03-28 DIAGNOSIS — M810 Age-related osteoporosis without current pathological fracture: Secondary | ICD-10-CM | POA: Insufficient documentation

## 2017-03-28 DIAGNOSIS — R0602 Shortness of breath: Secondary | ICD-10-CM | POA: Diagnosis not present

## 2017-03-28 DIAGNOSIS — E039 Hypothyroidism, unspecified: Secondary | ICD-10-CM | POA: Insufficient documentation

## 2017-03-28 DIAGNOSIS — I700 Atherosclerosis of aorta: Secondary | ICD-10-CM | POA: Diagnosis not present

## 2017-03-28 DIAGNOSIS — Z170 Estrogen receptor positive status [ER+]: Secondary | ICD-10-CM | POA: Insufficient documentation

## 2017-03-28 DIAGNOSIS — I313 Pericardial effusion (noninflammatory): Secondary | ICD-10-CM | POA: Diagnosis not present

## 2017-03-28 DIAGNOSIS — C7931 Secondary malignant neoplasm of brain: Secondary | ICD-10-CM | POA: Insufficient documentation

## 2017-03-28 DIAGNOSIS — J45909 Unspecified asthma, uncomplicated: Secondary | ICD-10-CM | POA: Insufficient documentation

## 2017-03-28 DIAGNOSIS — Z79899 Other long term (current) drug therapy: Secondary | ICD-10-CM | POA: Diagnosis not present

## 2017-03-28 DIAGNOSIS — Z801 Family history of malignant neoplasm of trachea, bronchus and lung: Secondary | ICD-10-CM | POA: Insufficient documentation

## 2017-03-28 DIAGNOSIS — I314 Cardiac tamponade: Secondary | ICD-10-CM | POA: Diagnosis not present

## 2017-03-28 DIAGNOSIS — F329 Major depressive disorder, single episode, unspecified: Secondary | ICD-10-CM | POA: Diagnosis not present

## 2017-03-28 DIAGNOSIS — Z9013 Acquired absence of bilateral breasts and nipples: Secondary | ICD-10-CM | POA: Diagnosis not present

## 2017-03-28 DIAGNOSIS — C50411 Malignant neoplasm of upper-outer quadrant of right female breast: Secondary | ICD-10-CM

## 2017-03-28 DIAGNOSIS — C50911 Malignant neoplasm of unspecified site of right female breast: Secondary | ICD-10-CM | POA: Diagnosis not present

## 2017-03-28 DIAGNOSIS — I471 Supraventricular tachycardia: Secondary | ICD-10-CM | POA: Diagnosis not present

## 2017-03-28 LAB — CYTOLOGY - NON PAP

## 2017-03-29 ENCOUNTER — Telehealth: Payer: Self-pay | Admitting: Internal Medicine

## 2017-03-29 ENCOUNTER — Ambulatory Visit: Admission: RE | Admit: 2017-03-29 | Payer: 59 | Source: Ambulatory Visit

## 2017-03-29 NOTE — Telephone Encounter (Signed)
[facility] called to let us know patient did not show for her Myoview this morning.   Please advise

## 2017-03-29 NOTE — Telephone Encounter (Signed)
Patient was extremely tearful and audibly crying stating that she has already rescheduled and that the person who called her was not the nicest. She was very apologetic about missing the test and stated that she just had so much going on right now. She reported that it was rescheduled to Monday and again was very apologetic while crying. She confirmed appointment and had no further questions or concerns at this time.

## 2017-03-29 NOTE — Progress Notes (Signed)
  Oncology Nurse Navigator Documentation  Navigator Location: [facility] (03/28/17 1200)   )                      Patient Visit Type: MedOnc;Initial (03/28/17 1200)                              Time Spent with Patient: 90 (03/28/17 1200)  Supported patient at appointment with Dr. Grayland Ormond.  Awaiting final pathology to determine treatment plan.  Notified Dr. Thompson Caul office t schedule port placement after patient returns for follow-up with Dr. Grayland Ormond 04/04/17.  Scheduled for PET, and MRI on 04/03/17.

## 2017-04-01 ENCOUNTER — Ambulatory Visit
Admission: RE | Admit: 2017-04-01 | Discharge: 2017-04-01 | Disposition: A | Discharge status: Home / Self Care | Payer: 59 | Source: Ambulatory Visit | Attending: Internal Medicine | Admitting: Internal Medicine

## 2017-04-01 DIAGNOSIS — R748 Abnormal levels of other serum enzymes: Secondary | ICD-10-CM | POA: Diagnosis not present

## 2017-04-01 DIAGNOSIS — R9431 Abnormal electrocardiogram [ECG] [EKG]: Secondary | ICD-10-CM

## 2017-04-01 DIAGNOSIS — R778 Other specified abnormalities of plasma proteins: Secondary | ICD-10-CM

## 2017-04-01 DIAGNOSIS — R7989 Other specified abnormal findings of blood chemistry: Secondary | ICD-10-CM

## 2017-04-01 LAB — NM MYOCAR MULTI W/SPECT W/WALL MOTION / EF
CHL CUP NUCLEAR SDS: 4
CHL CUP NUCLEAR SRS: 3
CHL CUP NUCLEAR SSS: 9
CHL CUP RESTING HR STRESS: 77 {beats}/min
CSEPHR: 57 %
CSEPPHR: 91 {beats}/min
LV dias vol: 42 mL (ref 46–106)
LVSYSVOL: 14 mL
TID: 1.04

## 2017-04-01 MED ORDER — TECHNETIUM TC 99M TETROFOSMIN IV KIT
32.2510 | PACK | Freq: Once | INTRAVENOUS | Status: AC | PRN
  Administered 2017-04-01: 32.251 via INTRAVENOUS

## 2017-04-01 MED ORDER — TECHNETIUM TC 99M TETROFOSMIN IV KIT
12.7000 | PACK | Freq: Once | INTRAVENOUS | Status: AC | PRN
  Administered 2017-04-01: 12.7 via INTRAVENOUS

## 2017-04-01 MED ORDER — REGADENOSON 0.4 MG/5ML IV SOLN
0.4000 mg | Freq: Once | INTRAVENOUS | Status: AC
  Administered 2017-04-01: 0.4 mg via INTRAVENOUS

## 2017-04-02 ENCOUNTER — Encounter
Admission: RE | Admit: 2017-04-02 | Discharge: 2017-04-02 | Disposition: A | Discharge status: Home / Self Care | Payer: 59 | Source: Ambulatory Visit | Attending: Surgery | Admitting: Surgery

## 2017-04-02 ENCOUNTER — Telehealth: Payer: Self-pay | Admitting: Internal Medicine

## 2017-04-02 ENCOUNTER — Ambulatory Visit (INDEPENDENT_AMBULATORY_CARE_PROVIDER_SITE_OTHER): Payer: 59

## 2017-04-02 ENCOUNTER — Other Ambulatory Visit: Payer: Self-pay

## 2017-04-02 DIAGNOSIS — I318 Other specified diseases of pericardium: Secondary | ICD-10-CM

## 2017-04-02 DIAGNOSIS — I313 Pericardial effusion (noninflammatory): Principal | ICD-10-CM

## 2017-04-02 DIAGNOSIS — R590 Localized enlarged lymph nodes: Secondary | ICD-10-CM | POA: Diagnosis not present

## 2017-04-02 DIAGNOSIS — I3131 Malignant pericardial effusion in diseases classified elsewhere: Secondary | ICD-10-CM

## 2017-04-02 DIAGNOSIS — C801 Malignant (primary) neoplasm, unspecified: Secondary | ICD-10-CM

## 2017-04-02 DIAGNOSIS — C50411 Malignant neoplasm of upper-outer quadrant of right female breast: Secondary | ICD-10-CM | POA: Diagnosis present

## 2017-04-02 DIAGNOSIS — R918 Other nonspecific abnormal finding of lung field: Secondary | ICD-10-CM | POA: Diagnosis not present

## 2017-04-02 DIAGNOSIS — C7931 Secondary malignant neoplasm of brain: Secondary | ICD-10-CM | POA: Diagnosis not present

## 2017-04-02 HISTORY — DX: Anxiety disorder, unspecified: F41.9

## 2017-04-02 HISTORY — DX: Anemia, unspecified: D64.9

## 2017-04-02 HISTORY — DX: Diverticulosis of intestine, part unspecified, without perforation or abscess without bleeding: K57.90

## 2017-04-02 HISTORY — DX: Unspecified osteoarthritis, unspecified site: M19.90

## 2017-04-02 HISTORY — DX: Major depressive disorder, single episode, unspecified: F32.9

## 2017-04-02 HISTORY — DX: Unspecified asthma, uncomplicated: J45.909

## 2017-04-02 HISTORY — DX: Depression, unspecified: F32.A

## 2017-04-02 HISTORY — DX: Pleural effusion, not elsewhere classified: J90

## 2017-04-02 HISTORY — DX: Headache: R51

## 2017-04-02 HISTORY — DX: Other bursitis of hip, left hip: M70.72

## 2017-04-02 HISTORY — DX: Headache, unspecified: R51.9

## 2017-04-02 HISTORY — DX: Acute embolism and thrombosis of unspecified deep veins of unspecified distal lower extremity: I82.4Z9

## 2017-04-02 LAB — ECHOCARDIOGRAM LIMITED
HEIGHTINCHES: 63 in
Weight: 3472 oz

## 2017-04-02 NOTE — Progress Notes (Signed)
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   INTERVAL HISTORY: Patient returns to clinic today for further evaluation and discussion of her imaging results. She is highly anxious, but otherwise feels well. She has no further shortness of breath.  She has no neurologic complaints. She has a good appetite and has maintained her weight.  She has no chest pain, cough, or hemoptysis.  She has no nausea, vomiting, constipation, or diarrhea.  Patient offers no specific complaints today.  REVIEW OF SYSTEMS:   Review of Systems  Constitutional: Negative.  Negative for fever, malaise/fatigue and weight loss.  Respiratory: Negative.  Negative for cough and shortness of breath.   Cardiovascular: Negative.  Negative for chest pain and leg swelling.  Gastrointestinal: Negative.  Negative for abdominal pain.  Genitourinary: Negative.   Musculoskeletal: Negative.   Skin: Negative.  Negative for rash.  Neurological: Negative.  Negative for sensory change and weakness.  Psychiatric/Behavioral: The patient is nervous/anxious.    As per HPI. Otherwise, a complete review of systems is negative.   PAST MEDICAL HISTORY:  Hypothyroidism, migraines, depression, asthma.  PAST SURGICAL HISTORY: Bilateral mastectomy with reconstruction, partial hysterectomy.  FAMILY HISTORY: Lung cancer, melanoma, stomach cancer.  Also diabetes, CAD, hypertension     ADVANCED DIRECTIVES:    HEALTH MAINTENANCE: Social History  Substance Use Topics  . Smoking status: Never Smoker  .  Smokeless tobacco: Never Used  . Alcohol use No     Colonoscopy:  PAP:  Bone density:  Lipid panel:  Allergies  Allergen Reactions  . Codeine Other (See Comments)    constipation  . Sulfa Antibiotics Other (See Comments)    Allergy as a child    Current Outpatient Prescriptions  Medication Sig Dispense Refill  . alendronate (FOSAMAX) 70 MG tablet Take 70 mg by mouth once a week. Take with a full glass of water on an empty stomach.     . ALPRAZolam (XANAX) 0.25 MG tablet Take 1 tablet by mouth 3 (three) times daily as needed for anxiety.     Marland Kitchen azelastine (ASTELIN) 0.1 % nasal spray Place 1 spray into both nostrils daily as needed for rhinitis. Use in each nostril as directed    . calcium-vitamin D (OSCAL WITH D) 250-125 MG-UNIT tablet Take 1 tablet by mouth daily.    . Cholecalciferol (D3 HIGH POTENCY) 2000 units CAPS Take 1 capsule by mouth daily.    . cyclobenzaprine (FLEXERIL) 10 MG tablet Take 10 mg by mouth 3 (three) times daily as needed.     . DULoxetine (CYMBALTA) 30 MG capsule Take 90 mg by mouth every morning.     . fluticasone (FLOVENT HFA) 110 MCG/ACT inhaler Inhale 1 puff into the lungs 2 (two) times daily.    Marland Kitchen levothyroxine (SYNTHROID, LEVOTHROID) 137 MCG tablet Take 137 mcg by mouth daily before breakfast.     . metoprolol succinate (TOPROL-XL) 25 MG 24 hr tablet Take 75 mg by mouth at bedtime.     . Multiple Vitamin (MULTIVITAMIN) tablet Take 1 tablet by mouth daily.    . prochlorperazine (COMPAZINE) 10 MG tablet Take 10  mg by mouth every 6 (six) hours as needed for nausea or vomiting.    . topiramate (TOPAMAX) 50 MG tablet Take 50 mg by mouth at bedtime.     . traMADol (ULTRAM) 50 MG tablet Take 25-50 mg by mouth every 6 (six) hours as needed.      No current facility-administered medications for this visit.     OBJECTIVE: Vitals:   04/04/17 1454  BP: 124/60  Pulse: 80  Resp: 18  Temp: 97.9 F (36.6 C)  SpO2: 100%     Body mass index is 38.6 kg/m.     ECOG FS:0 - Asymptomatic  General: Well-developed, well-nourished, no acute distress. Eyes: anicteric sclera. Breasts: Patient has bilateral mastectomies with reconstruction, no obvious evidence of local recurrence. Lungs: Clear to auscultation bilaterally. Heart: Regular rate and rhythm. No rubs, murmurs, or gallops. Abdomen: Soft, nontender, nondistended. No organomegaly noted, normoactive bowel sounds. Musculoskeletal: No edema, cyanosis, or clubbing. Neuro: Alert, answering all questions appropriately. Cranial nerves grossly intact. Skin: No rashes or petechiae noted. Psych: Normal affect.   LAB RESULTS:  Lab Results  Component Value Date   NA 141 03/24/2017   K 4.0 03/24/2017   CL 107 03/24/2017   CO2 26 03/24/2017   GLUCOSE 129 (H) 03/24/2017   BUN 17 03/24/2017   CREATININE 0.71 03/24/2017   CALCIUM 8.8 (L) 03/24/2017   PROT 6.5 03/19/2017   ALBUMIN 3.9 03/18/2017   AST 46 (H) 03/18/2017   ALT 60 (H) 03/18/2017   ALKPHOS 72 03/18/2017   BILITOT 1.2 03/18/2017   GFRNONAA >60 03/24/2017   GFRAA >60 03/24/2017    Lab Results  Component Value Date   WBC 10.3 03/24/2017   NEUTROABS 9.3 (H) 03/18/2017   HGB 13.2 03/24/2017   HCT 39.6 03/24/2017   MCV 95.0 03/24/2017   PLT 313 03/24/2017     STUDIES: Ct Angio Chest Pe W Or Wo Contrast  Result Date: 03/18/2017 CLINICAL DATA:  Tachycardia and dyspnea. EXAM: CT ANGIOGRAPHY CHEST WITH CONTRAST TECHNIQUE: Multidetector CT imaging of the chest was performed using the standard protocol during bolus administration of intravenous contrast. Multiplanar CT image reconstructions and MIPs were obtained to evaluate the vascular anatomy. CONTRAST:  75 cc Isovue 370 IV COMPARISON:  Same day CXR FINDINGS: Cardiovascular: Large pericardial effusion, anteriorly measuring up to 1.9 cm in thickness, 2.9 cm in thickness at the cardiac apex and posteriorly at 2.2 cm in thickness. The heart is otherwise normal in size. There is good  opacification of the pulmonary arteries. No pulmonary embolus is identified. No aortic aneurysm or dissection. Minimal aortic atherosclerosis. Mediastinum/Nodes: No enlarged mediastinal, hilar, or axillary lymph nodes. Thyroid gland, trachea, and esophagus demonstrate no significant findings. Lungs/Pleura: Bilateral small to moderate pleural effusions with adjacent atelectasis. No dominant mass. Faint bilateral ground-glass opacities may represent stigmata of pulmonary edema or hypoventilatory change among some possibilities though not exclusive. Upper Abdomen: Bilateral saline subglandular implants with mild bilateral skin thickening and soft tissue induration about the left breast. Clinical correlation recommended. Musculoskeletal: Dextroscoliosis of the thoracic spine without acute nor suspicious osseous abnormality. Review of the MIP images confirms the above findings. IMPRESSION: 1. Large pericardial effusion.  Cardiology assessment recommended. 2. Small bilateral pleural effusions with atelectasis and ground-glass opacities likely representing stigmata of CHF. 3. No acute pulmonary embolus. 4. Bilateral skin thickening and subcutaneous fatty induration of the left breast with underlying bilateral saline subglandular implants. Clinical correlation recommended. Aortic Atherosclerosis (ICD10-I70.0). Electronically Signed   By: Ashley Royalty  M.D.   On: 03/18/2017 20:09   Mr Jeri Cos VO Contrast  Result Date: 04/03/2017 CLINICAL DATA:  Breast cancer, here for restaging. Assess for intracranial metastasis. EXAM: MRI HEAD WITHOUT AND WITH CONTRAST TECHNIQUE: Multiplanar, multiecho pulse sequences of the brain and surrounding structures were obtained without and with intravenous contrast. CONTRAST:  20 cc MultiHance COMPARISON:  None available for comparison at time of study interpretation. CT HEAD report reviewed though images are not available for direct comparison. FINDINGS: INTRACRANIAL CONTENTS: Four  supratentorial parenchymal cystic metastasis with peripheral reduced diffusion consistent with hypercellular tumor as follows: 12 mm LEFT putaminal, 8 mm LEFT internal capsule posterior limb, 8 mm splenium of corpus callosum, 14 mm LEFT parietal lobe.Minimal surrounding FLAIR T2 hyperintense vasogenic edema. In addition, 14 mm solidly enhancing, low T2 RIGHT frontal dural based mass with dural tail and susceptibility artifact most compatible with meningioma. No midline shift or mass effect. Ventricles and sulci are normal for patient's age. No abnormal extra-axial fluid collection. VASCULAR: Normal major intracranial vascular flow voids present at skull base. SKULL AND UPPER CERVICAL SPINE: No abnormal sellar expansion. No suspicious calvarial bone marrow signal. Craniocervical junction maintained. SINUSES/ORBITS: The mastoid air-cells and included paranasal sinuses are well-aerated.The included ocular globes and orbital contents are non-suspicious. OTHER: None. IMPRESSION: 1. Four supratentorial parenchymal metastasis, largest in LEFT parietal lobe measuring 14 mm. No significant mass effect. 2. 14 mm RIGHT frontal meningioma. 3. Otherwise negative MRI of the head with and without contrast. These results will be called to the ordering clinician or representative by the Radiologist Assistant, and communication documented in the PACS or zVision Dashboard. Electronically Signed   By: Elon Alas M.D.   On: 04/03/2017 14:21   Nm Myocar Multi W/spect W/wall Motion / Ef  Result Date: 04/01/2017  T wave inversion was noted during stress in the I, II, III, aVF, V2, V3, V4, V5 and V6 leads.  There was no ST segment deviation noted during stress.  The study is normal.  This is a low risk study.  The left ventricular ejection fraction is normal (55-65%).    Nm Pet Image Initial (pi) Skull Base To Thigh  Result Date: 04/03/2017 CLINICAL DATA:  Initial treatment strategy for left breast cancer. EXAM: NUCLEAR  MEDICINE PET SKULL BASE TO THIGH TECHNIQUE: 12.5 mCi F-18 FDG was injected intravenously. Full-ring PET imaging was performed from the skull base to thigh after the radiotracer. CT data was obtained and used for attenuation correction and anatomic localization. FASTING BLOOD GLUCOSE:  Value: 95 mg/dl COMPARISON:  Chest CT 03/18/2017 FINDINGS: NECK: Diffuse increased uptake in the thyroid gland likely due to inflammations/thyroiditis. 5 x 8 mm left-sided supraclavicular node on image number 57 is metabolically active with SUV max of 3.6. No neck mass or neck nodes. CHEST: A discrete left breast mass is not identified. There is mild skin thickening and subcutaneous interstitial changes which are mildly hypermetabolic. Multiple enlarged hypermetabolic left axillary lymph nodes are noted. The largest measures 23 x 13.5 mm on image number 69. SUV max is 12.9. 9 mm node on image number 79 has SUV max of 10.5. 5 mm prevascular lymph node better seen on prior chest CT on image number 19 has an SUV max of 3.6. The 4 mm right middle lobe pulmonary nodule on image number 107 is indeterminate. There also small pulmonary nodules noted in the right upper lobe on image number 92 and near the right major fissure on image number 96. Small pulmonary metastasis are  possible. Attention on future scans is suggested. No areas of hypermetabolism. ABDOMEN/PELVIS: The lateral limb of the right adrenal gland is thickened and is hypermetabolic with SUV max of 5.53. Several small but metabolically active retroperitoneal lymph nodes are noted. 6 mm node between the aorta and IVC on image number 148 has an SUV max of 5.6. Smaller noted anterior to the IVC near the pancreatic head has SUV max of 4.3. Slightly lower interaortocaval lymph node on image number 152 measures 7 mm and has SUV max of 6.0. Small left-sided retroperitoneal lymph nodes are also hypermetabolic. The 7 mm node on image number 163 has an SUV max of 6.5. Incidental findings  include multiple left-sided renal calculi and a left ovarian cyst. SKELETON: No findings for osseous metastatic disease. IMPRESSION: 1. A discrete left breast mass is not identified but there are enlarged and hypermetabolic left axillary lymph nodes consistent with metastatic adenopathy. 2. Small left supraclavicular lymph node is hypermetabolic and worrisome for metastatic adenopathy. There is also a metabolically active small prevascular lymph node and small pulmonary nodules suspicious for metastasis. 3. Numerous small retroperitoneal lymph nodes are hypermetabolic and concerning for metastatic disease. 4. Slightly thickened right adrenal gland without discrete mass is hypermetabolic and suspicious for metastasis. 5. No findings to suggest hepatic or osseous metastatic disease. Electronically Signed   By: Marijo Sanes M.D.   On: 04/03/2017 13:59   Dg Chest Port 1 View  Result Date: 03/21/2017 CLINICAL DATA:  61 year old female with shortness of Breath. Pericardiocentesis 2 days ago. Pericardial and pleural effusions. EXAM: PORTABLE CHEST 1 VIEW COMPARISON:  03/19/2017 and earlier. FINDINGS: Portable AP upright view at 0808 hours. Bilateral breast implants incidentally noted. Continued veiling opacity at the left lung base and indistinct appearance of the lower heart border. A drainage catheter continues to project over the cardiac silhouette and appears stable. Small right pleural effusion appears stable. Decreased pulmonary vascularity. No pneumothorax. Other mediastinal contours are stable. Visualized tracheal air column is within normal limits. IMPRESSION: 1. Stable pericardial catheter and cardiac size since 03/19/2017. 2. Decreased pulmonary vascularity, no acute edema. 3. Continued confluent opacity at the left lung base, probably a combination of pleural effusion and atelectasis or consolidation. 4. No new cardiopulmonary abnormality. Electronically Signed   By: Genevie Ann M.D.   On: 03/21/2017 08:48    Dg Chest Port 1 View  Result Date: 03/19/2017 CLINICAL DATA:  Chest pain EXAM: PORTABLE CHEST 1 VIEW COMPARISON:  03/18/2017 chest radiograph. FINDINGS: Pericardial drain terminates over the right heart. Mildly enlarged cardiopericardial silhouette, slightly decreased in size. Otherwise stable mediastinal contour. No pneumothorax. Stable trace right and small left pleural effusions. No overt pulmonary edema. Patchy bibasilar lung opacities, left greater than right, stable. IMPRESSION: 1. No pneumothorax. 2. Stable small left and trace right pleural effusions and left greater than right bibasilar lung opacities. 3. Pericardial drain terminates over the right heart. Mildly enlarged cardiopericardial silhouette, slightly decreased. Electronically Signed   By: Ilona Sorrel M.D.   On: 03/19/2017 13:18   Dg Chest Port 1 View  Result Date: 03/18/2017 CLINICAL DATA:  Tachycardia and pain EXAM: PORTABLE CHEST 1 VIEW COMPARISON:  None. FINDINGS: Cardiac shadow is enlarged. Increased density is noted over the left base in part related to overlying soft tissue although some effusion and infiltrate is likely present as well. No acute bony abnormality is seen. IMPRESSION: Increased density over the left base likely representing a combination of effusion and infiltrate. Two-view chest is recommended when the patient's  condition allows. Electronically Signed   By: Inez Catalina M.D.   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   PLAN:    1. 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 Patient to have extensive lymphatic disease as well as brain metastasis. Case discussed at length with pathology confirming malignancy in pericardial fluid, although cells stained ER and HER-2 negative. They are GATA-3 positive suggesting breast cancer. Patient's CA-27-29 is now mildly elevated at 65.6. Patient has a port placement scheduled for next Tuesday and will plan on obtaining additional tissue from her PET positive axillary lymph nodes at the same time. We will send this tissue for Foundation 1 testing as well. Although her biomarkers are different than her original diagnosis, this is likely a recurrence of her breast cancer or possibly a second breast primary. Return to clinic in 1 week for treatment planning.  2. Brain metastasis: Patient was given a referral to radiation oncology. 3. Malignant pericardial fluid: Continue follow-up with cardiology as indicated.  Patient expressed understanding and was in agreement with this plan. She also understands that She can call clinic at any time with any questions, concerns, or complaints.   Approximately 30 minutes was spent in discussion of which greater than 50% was consultation.   Breast cancer   Staging form: Breast, AJCC 7th Edition     Clinical stage from 02/14/2015: Stage IA (T1c, N0, M0) - Signed by Lloyd Huger, MD on 02/14/2015   Lloyd Huger, MD   04/06/2017 8:15 AM

## 2017-04-02 NOTE — Telephone Encounter (Signed)
Requesting surgical clearance:   1. Type of surgery: port-a-cath placement 2. Surgeon: Dr. Tamala Julian  3. Surgical date: TBD  4. Medications that need to be held: n/a     5. I will defer to: Dr. Abund[facility] Fax [phone] Phone [phone]

## 2017-04-02 NOTE — Telephone Encounter (Signed)
[facility] is calling for clearance for port placement

## 2017-04-02 NOTE — Patient Instructions (Signed)
  Your procedure is scheduled on: April 09, 2017 [facility] ) Report to Same Day Surgery 2nd floor medical mall [facility] Entrance-take elevator on left to 2nd floor.  Check in with surgery information desk.) To find out your arrival time please call [phone] between 1PM - 3PM on April 08, 2017 [facility] )  Remember: Instructions that are not followed completely may result in serious medical risk, up to and including death, or upon the discretion of your surgeon and anesthesiologist your surgery may need to be rescheduled.    _x___ 1. Do not eat food or drink liquids after midnight. No gum chewing or hard candies                               __x__ 2. No Alcohol for 24 hours before or after surgery.   __x__3. No Smoking for 24 prior to surgery.   ____  4. Bring all medications with you on the day of surgery if instructed.    __x__ 5. Notify your doctor if there is any change in your medical condition     (cold, fever, infections).     Do not wear jewelry, make-up, hairpins, clips or nail polish.  Do not wear lotions, powders, or perfumes.   Do not shave 48 hours prior to surgery. Men may shave face and neck.  Do not bring valuables to the hospital.    [facility] is not responsible for any belongings or valuables.               Contacts, dentures or bridgework may not be worn into surgery.  Leave your suitcase in the car. After surgery it may be brought to your room.  For patients admitted to the hospital, discharge time is determined by your  treatment team                       Patients discharged the day of surgery will not be allowed to drive home.  You will need someone to drive you home and stay with you the night of your procedure.    Please read over the following fact sheets that you were given:   [facility] Preparing for Surgery and or MRSA Information   TAKE THE FOLLOWING MEDICATIONS THE MORNING OF SURGERY WITH A SIP OF WATER   1. CYMBALTA  2.  SYNTHROID  3.  4.  5.  6.  ____Fleets enema or Magnesium Citrate as directed.   _x___ Use CHG Soap or sage wipes as directed on instruction sheet   __x__ Use inhalers on the day of surgery and bring to hospital day of surgery ([facility] )  ____ Stop Metformin and Janumet 2 days prior to surgery.    ____ Take 1/2 of usual insulin dose the night before surgery and none on the morning surgery      _x___ Follow recommendations from Cardiologist, Pulmonologist or PCP regarding          stopping Aspirin, Coumadin, Pllavix ,Eliquis, Effient, or Pradaxa, and Pletal.  X____Stop Anti-inflammatories such as Advil, Aleve, Ibuprofen, Motrin, Naproxen, Naprosyn, Goodies powders or aspirin products. OK to take Tylenol    _x___ Stop supplements until after surgery.  But may continue Vitamin D, Vitamin B, and multivitamin        ____ Bring C-Pap to the hospital.

## 2017-04-03 ENCOUNTER — Ambulatory Visit
Admission: RE | Admit: 2017-04-03 | Discharge: 2017-04-03 | Disposition: A | Discharge status: Home / Self Care | Payer: 59 | Source: Ambulatory Visit | Attending: Oncology | Admitting: Oncology

## 2017-04-03 ENCOUNTER — Other Ambulatory Visit: Payer: Self-pay

## 2017-04-03 DIAGNOSIS — C50919 Malignant neoplasm of unspecified site of unspecified female breast: Secondary | ICD-10-CM | POA: Diagnosis not present

## 2017-04-03 DIAGNOSIS — R918 Other nonspecific abnormal finding of lung field: Secondary | ICD-10-CM | POA: Diagnosis not present

## 2017-04-03 DIAGNOSIS — C50411 Malignant neoplasm of upper-outer quadrant of right female breast: Secondary | ICD-10-CM | POA: Diagnosis not present

## 2017-04-03 DIAGNOSIS — R590 Localized enlarged lymph nodes: Secondary | ICD-10-CM | POA: Insufficient documentation

## 2017-04-03 DIAGNOSIS — C7931 Secondary malignant neoplasm of brain: Secondary | ICD-10-CM | POA: Insufficient documentation

## 2017-04-03 DIAGNOSIS — C50912 Malignant neoplasm of unspecified site of left female breast: Secondary | ICD-10-CM | POA: Diagnosis not present

## 2017-04-03 LAB — GLUCOSE, CAPILLARY: Glucose-Capillary: 95 mg/dL (ref 65–99)

## 2017-04-03 MED ORDER — FLUDEOXYGLUCOSE F - 18 (FDG) INJECTION
12.0000 | Freq: Once | INTRAVENOUS | Status: AC | PRN
  Administered 2017-04-03: 12.46 via INTRAVENOUS

## 2017-04-03 MED ORDER — GADOBENATE DIMEGLUMINE 529 MG/ML IV SOLN
20.0000 mL | Freq: Once | INTRAVENOUS | Status: AC | PRN
  Administered 2017-04-03: 20 mL via INTRAVENOUS

## 2017-04-03 NOTE — Telephone Encounter (Signed)
Clearance routed to fax number provided.

## 2017-04-03 NOTE — Telephone Encounter (Signed)
Ms. Namba is cleared to proceed with port-a-cath placement from a cardiac standpoint (low-risk procedure). Thanks.  Nelva Bush, MD [facility] Pager: [phone]

## 2017-04-04 ENCOUNTER — Inpatient Hospital Stay (HOSPITAL_BASED_OUTPATIENT_CLINIC_OR_DEPARTMENT_OTHER): Payer: 59 | Admitting: Oncology

## 2017-04-04 ENCOUNTER — Institutional Professional Consult (permissible substitution): Payer: 59 | Admitting: Radiation Oncology

## 2017-04-04 ENCOUNTER — Encounter: Payer: Self-pay | Admitting: Oncology

## 2017-04-04 VITALS — BP 124/60 | HR 80 | Temp 97.9°F | Resp 18 | Wt 217.9 lb

## 2017-04-04 DIAGNOSIS — J90 Pleural effusion, not elsewhere classified: Secondary | ICD-10-CM | POA: Diagnosis not present

## 2017-04-04 DIAGNOSIS — I700 Atherosclerosis of aorta: Secondary | ICD-10-CM

## 2017-04-04 DIAGNOSIS — C50411 Malignant neoplasm of upper-outer quadrant of right female breast: Secondary | ICD-10-CM

## 2017-04-04 DIAGNOSIS — I313 Pericardial effusion (noninflammatory): Secondary | ICD-10-CM

## 2017-04-04 DIAGNOSIS — E039 Hypothyroidism, unspecified: Secondary | ICD-10-CM

## 2017-04-04 DIAGNOSIS — C7931 Secondary malignant neoplasm of brain: Secondary | ICD-10-CM

## 2017-04-04 DIAGNOSIS — R0602 Shortness of breath: Secondary | ICD-10-CM | POA: Diagnosis not present

## 2017-04-04 DIAGNOSIS — I314 Cardiac tamponade: Secondary | ICD-10-CM | POA: Diagnosis not present

## 2017-04-04 DIAGNOSIS — F329 Major depressive disorder, single episode, unspecified: Secondary | ICD-10-CM

## 2017-04-04 DIAGNOSIS — Z801 Family history of malignant neoplasm of trachea, bronchus and lung: Secondary | ICD-10-CM

## 2017-04-04 DIAGNOSIS — Z170 Estrogen receptor positive status [ER+]: Secondary | ICD-10-CM | POA: Diagnosis not present

## 2017-04-04 DIAGNOSIS — M810 Age-related osteoporosis without current pathological fracture: Secondary | ICD-10-CM

## 2017-04-04 DIAGNOSIS — Z9013 Acquired absence of bilateral breasts and nipples: Secondary | ICD-10-CM

## 2017-04-04 DIAGNOSIS — Z800 Family history of malignant neoplasm of digestive organs: Secondary | ICD-10-CM

## 2017-04-04 DIAGNOSIS — J45909 Unspecified asthma, uncomplicated: Secondary | ICD-10-CM | POA: Diagnosis not present

## 2017-04-04 DIAGNOSIS — F419 Anxiety disorder, unspecified: Secondary | ICD-10-CM | POA: Diagnosis not present

## 2017-04-04 DIAGNOSIS — Z79899 Other long term (current) drug therapy: Secondary | ICD-10-CM

## 2017-04-08 ENCOUNTER — Ambulatory Visit
Admission: RE | Admit: 2017-04-08 | Discharge: 2017-04-08 | Disposition: A | Discharge status: Home / Self Care | Payer: 59 | Source: Ambulatory Visit | Attending: Radiation Oncology | Admitting: Radiation Oncology

## 2017-04-08 ENCOUNTER — Encounter: Payer: Self-pay | Admitting: Radiation Oncology

## 2017-04-08 DIAGNOSIS — E785 Hyperlipidemia, unspecified: Secondary | ICD-10-CM | POA: Diagnosis not present

## 2017-04-08 DIAGNOSIS — J45909 Unspecified asthma, uncomplicated: Secondary | ICD-10-CM | POA: Insufficient documentation

## 2017-04-08 DIAGNOSIS — Z510 Encounter for antineoplastic radiation therapy: Secondary | ICD-10-CM | POA: Insufficient documentation

## 2017-04-08 DIAGNOSIS — F419 Anxiety disorder, unspecified: Secondary | ICD-10-CM | POA: Diagnosis not present

## 2017-04-08 DIAGNOSIS — E039 Hypothyroidism, unspecified: Secondary | ICD-10-CM | POA: Diagnosis not present

## 2017-04-08 DIAGNOSIS — Z86718 Personal history of other venous thrombosis and embolism: Secondary | ICD-10-CM | POA: Diagnosis not present

## 2017-04-08 DIAGNOSIS — Z170 Estrogen receptor positive status [ER+]: Secondary | ICD-10-CM | POA: Insufficient documentation

## 2017-04-08 DIAGNOSIS — M129 Arthropathy, unspecified: Secondary | ICD-10-CM | POA: Insufficient documentation

## 2017-04-08 DIAGNOSIS — Z79899 Other long term (current) drug therapy: Secondary | ICD-10-CM | POA: Diagnosis not present

## 2017-04-08 DIAGNOSIS — E669 Obesity, unspecified: Secondary | ICD-10-CM | POA: Diagnosis not present

## 2017-04-08 DIAGNOSIS — I313 Pericardial effusion (noninflammatory): Secondary | ICD-10-CM | POA: Diagnosis not present

## 2017-04-08 DIAGNOSIS — D649 Anemia, unspecified: Secondary | ICD-10-CM | POA: Insufficient documentation

## 2017-04-08 DIAGNOSIS — C50411 Malignant neoplasm of upper-outer quadrant of right female breast: Secondary | ICD-10-CM | POA: Insufficient documentation

## 2017-04-08 DIAGNOSIS — Z9013 Acquired absence of bilateral breasts and nipples: Secondary | ICD-10-CM | POA: Insufficient documentation

## 2017-04-08 DIAGNOSIS — F329 Major depressive disorder, single episode, unspecified: Secondary | ICD-10-CM | POA: Insufficient documentation

## 2017-04-08 DIAGNOSIS — Z8719 Personal history of other diseases of the digestive system: Secondary | ICD-10-CM | POA: Diagnosis not present

## 2017-04-08 DIAGNOSIS — C7931 Secondary malignant neoplasm of brain: Secondary | ICD-10-CM | POA: Insufficient documentation

## 2017-04-08 NOTE — Consult Note (Signed)
NEW PATIENT EVALUATION  Name: Julie Irwin  MRN: [id number]  Date:   04/08/2017     DOB: 1956-01-12   This 61 y.o. female patient presents to the clinic for initial evaluation of brain metastasis secondary to probable stage IV breast cancer.  REFERRING PHYSICIAN: Adin Hector, MD  CHIEF COMPLAINT:  Chief Complaint  Patient presents with  . Cancer    metastatic disease to brain    DIAGNOSIS: The encounter diagnosis was Brain metastases ([place]).   PREVIOUS INVESTIGATIONS:  Brain MRI scan reviewed, PET CT scan reviewed Pathology report reviewed Clinical notes reviewed Case presented at weekly tumor board  HPI: Patient is an interesting case of a 61 year old female who had 6 years ago right-sided breast cancer apparently's early stage Ia ER positive PR negative HER-2/neu overexpressed of the upper outer quadrant of the right breast. She started feeling lightheaded and weak and was found to have a pericardial effusion. Tap of her pericardial effusion was positive for malignant cells. Characteristics with special stains was consistent with adenocarcinoma although her original tumor was HER-2/neu positive in this cells demonstrated HER-2/neu negative findings. She did have a PET CT scan showing positive left axillary lymph nodes and is scheduled for port placement as well as biopsy one lymph nodes tomorrow. It is interesting that the lymph node positivity on PET scan is in the left axilla while her initial breast cancer was right sided. As part of her workup she also had an MRI scan of the brain showing at least 5 lesions of the brain consistent with metastatic disease. She is doing fairly well no headaches she has not been started on Decadron. She seen today for consideration of whole brain radiation  PLANNED TREATMENT REGIMEN: Whole brain radiation  PAST MEDICAL HISTORY:  has a past medical history of Anemia; Anxiety; Arthritis; Asthma; Breast cancer ([place]); Bursitis of left hip;  Depression; Diverticulosis; H/O degenerative disc disease; Headache; Hyperlipidemia; Hypothyroidism; Lower leg DVT (deep venous thrombosis) ([place]) (1975); Morbid obesity ([place]); Pericardial effusion (03/19/2017); and Pleural effusion (03/2017).    PAST SURGICAL HISTORY:  Past Surgical History:  Procedure Laterality Date  . ABDOMINAL HYSTERECTOMY    . BREAST SURGERY    . MASTECTOMY Bilateral 02/27/2011  . PERICARDIOCENTESIS N/A 03/19/2017   Procedure: PERICARDIOCENTESIS;  Surgeon: Nelva Bush, MD;  Location: [facility];  Service: Cardiovascular;  Laterality: N/A;  . PLACEMENT OF BREAST IMPLANTS Bilateral 07/2011  . TONSILLECTOMY      FAMILY HISTORY: family history includes CVA in her mother; Colon polyps in her father; Emphysema in her father.  SOCIAL HISTORY:  reports that she has never smoked. She has never used smokeless tobacco. She reports that she does not drink alcohol or use drugs.  ALLERGIES: Codeine and Sulfa antibiotics  MEDICATIONS:  Current Outpatient Prescriptions  Medication Sig Dispense Refill  . alendronate (FOSAMAX) 70 MG tablet Take 70 mg by mouth once a week. Take with a full glass of water on an empty stomach.     . ALPRAZolam (XANAX) 0.25 MG tablet Take 1 tablet by mouth 3 (three) times daily as needed for anxiety.     Marland Kitchen azelastine (ASTELIN) 0.1 % nasal spray Place 1 spray into both nostrils daily as needed for rhinitis. Use in each nostril as directed    . calcium-vitamin D (OSCAL WITH D) 250-125 MG-UNIT tablet Take 1 tablet by mouth daily.    . Cholecalciferol (D3 HIGH POTENCY) 2000 units CAPS Take 1 capsule by mouth daily.    Marland Kitchen  cyclobenzaprine (FLEXERIL) 10 MG tablet Take 10 mg by mouth 3 (three) times daily as needed.     . DULoxetine (CYMBALTA) 30 MG capsule Take 90 mg by mouth every morning.     . fluticasone (FLOVENT HFA) 110 MCG/ACT inhaler Inhale 1 puff into the lungs 2 (two) times daily.    Marland Kitchen levothyroxine (SYNTHROID, LEVOTHROID) 137 MCG tablet  Take 137 mcg by mouth daily before breakfast.     . metoprolol succinate (TOPROL-XL) 25 MG 24 hr tablet Take 75 mg by mouth at bedtime.     . Multiple Vitamin (MULTIVITAMIN) tablet Take 1 tablet by mouth daily.    . prochlorperazine (COMPAZINE) 10 MG tablet Take 10 mg by mouth every 6 (six) hours as needed for nausea or vomiting.    . topiramate (TOPAMAX) 50 MG tablet Take 50 mg by mouth at bedtime.     . traMADol (ULTRAM) 50 MG tablet Take 25-50 mg by mouth every 6 (six) hours as needed.      No current facility-administered medications for this encounter.     ECOG PERFORMANCE STATUS:  0 - Asymptomatic  REVIEW OF SYSTEMS: Except for occasional dizziness Patient denies any weight loss, fatigue, weakness, fever, chills or night sweats. Patient denies any loss of vision, blurred vision. Patient denies any ringing  of the ears or hearing loss. No irregular heartbeat. Patient denies heart murmur or history of fainting. Patient denies any chest pain or pain radiating to her upper extremities. Patient denies any shortness of breath, difficulty breathing at night, cough or hemoptysis. Patient denies any swelling in the lower legs. Patient denies any nausea vomiting, vomiting of blood, or coffee ground material in the vomitus. Patient denies any stomach pain. Patient states has had normal bowel movements no significant constipation or diarrhea. Patient denies any dysuria, hematuria or significant nocturia. Patient denies any problems walking, swelling in the joints or loss of balance. Patient denies any skin changes, loss of hair or loss of weight. Patient denies any excessive worrying or anxiety or significant depression. Patient denies any problems with insomnia. Patient denies excessive thirst, polyuria, polydipsia. Patient denies any swollen glands, patient denies easy bruising or easy bleeding. Patient denies any recent infections, allergies or URI. Patient "s visual fields have not changed significantly  in recent time.    PHYSICAL EXAM: There were no vitals taken for this visit. Well-developed female in NAD. Crude visual fields are within normal range motor sensory and DTR levels are equal and symmetric in the upper lower lower extremities proprioception is intact. She is status post bilateral mastectomies. Well-developed well-nourished patient in NAD. HEENT reveals PERLA, EOMI, discs not visualized.  Oral cavity is clear. No oral mucosal lesions are identified. Neck is clear without evidence of cervical or supraclavicular adenopathy. Lungs are clear to A&P. Cardiac examination is essentially unremarkable with regular rate and rhythm without murmur rub or thrill. Abdomen is benign with no organomegaly or masses noted. Motor sensory and DTR levels are equal and symmetric in the upper and lower extremities. Cranial nerves II through XII are grossly intact. Proprioception is intact. No peripheral adenopathy or edema is identified. No motor or sensory levels are noted. Crude visual fields are within normal range.  LABORATORY DATA: Pathology reports reviewed will review pathology of her left axillary lymph node when becomes available    RADIOLOGY RESULTS: MRI scan of brain PET CT scan all reviewed   IMPRESSION: Probable stage IV breast cancer with brain metastasis in 61 year old female.  PLAN: Present  time I to go ahead and start whole brain radiation therapy. I personally set up and ordered CT simulation for next week. By that time we'll have her final pathology of her lymph node available. Although there is some discrepancy in her cytology this is probably stage IV breast cancer given the HER-2 positivity of her original tumor. Risks and benefits of whole brain radiation including hair loss fatigue possible cognitive decline possible middle ear fluid collection all were discussed in detail with the patient. She seems to comprehend my treatment plan well. I first a 7 ordered CT simulation for early next  week.  I would like to take this opportunity to thank you for allowing me to participate in the care of your patient.Armstead Peaks., MD

## 2017-04-09 ENCOUNTER — Ambulatory Visit: Payer: 59

## 2017-04-09 ENCOUNTER — Encounter: Payer: Self-pay | Admitting: *Deleted

## 2017-04-09 ENCOUNTER — Ambulatory Visit
Admission: RE | Admit: 2017-04-09 | Discharge: 2017-04-09 | Disposition: A | Discharge status: Home / Self Care | Payer: 59 | Source: Ambulatory Visit | Attending: Surgery | Admitting: Surgery

## 2017-04-09 ENCOUNTER — Encounter
Admission: RE | Disposition: A | Discharge status: Home / Self Care | Payer: Self-pay | Source: Ambulatory Visit | Attending: Surgery

## 2017-04-09 ENCOUNTER — Ambulatory Visit: Payer: 59 | Admitting: Anesthesiology

## 2017-04-09 DIAGNOSIS — I313 Pericardial effusion (noninflammatory): Secondary | ICD-10-CM | POA: Diagnosis not present

## 2017-04-09 DIAGNOSIS — F329 Major depressive disorder, single episode, unspecified: Secondary | ICD-10-CM | POA: Diagnosis not present

## 2017-04-09 DIAGNOSIS — Z9013 Acquired absence of bilateral breasts and nipples: Secondary | ICD-10-CM | POA: Insufficient documentation

## 2017-04-09 DIAGNOSIS — C773 Secondary and unspecified malignant neoplasm of axilla and upper limb lymph nodes: Secondary | ICD-10-CM | POA: Insufficient documentation

## 2017-04-09 DIAGNOSIS — Z4682 Encounter for fitting and adjustment of non-vascular catheter: Secondary | ICD-10-CM | POA: Diagnosis not present

## 2017-04-09 DIAGNOSIS — F419 Anxiety disorder, unspecified: Secondary | ICD-10-CM | POA: Insufficient documentation

## 2017-04-09 DIAGNOSIS — J910 Malignant pleural effusion: Secondary | ICD-10-CM | POA: Insufficient documentation

## 2017-04-09 DIAGNOSIS — J45909 Unspecified asthma, uncomplicated: Secondary | ICD-10-CM | POA: Diagnosis not present

## 2017-04-09 DIAGNOSIS — Z853 Personal history of malignant neoplasm of breast: Secondary | ICD-10-CM | POA: Diagnosis not present

## 2017-04-09 DIAGNOSIS — C801 Malignant (primary) neoplasm, unspecified: Secondary | ICD-10-CM | POA: Diagnosis not present

## 2017-04-09 DIAGNOSIS — R599 Enlarged lymph nodes, unspecified: Secondary | ICD-10-CM | POA: Diagnosis not present

## 2017-04-09 DIAGNOSIS — I3139 Other pericardial effusion (noninflammatory): Secondary | ICD-10-CM

## 2017-04-09 DIAGNOSIS — Z95828 Presence of other vascular implants and grafts: Secondary | ICD-10-CM

## 2017-04-09 DIAGNOSIS — D649 Anemia, unspecified: Secondary | ICD-10-CM | POA: Diagnosis not present

## 2017-04-09 DIAGNOSIS — E039 Hypothyroidism, unspecified: Secondary | ICD-10-CM | POA: Diagnosis not present

## 2017-04-09 DIAGNOSIS — C50919 Malignant neoplasm of unspecified site of unspecified female breast: Secondary | ICD-10-CM | POA: Diagnosis not present

## 2017-04-09 HISTORY — PX: PORTACATH PLACEMENT: SHX2246

## 2017-04-09 HISTORY — PX: AXILLARY LYMPH NODE DISSECTION: SHX5229

## 2017-04-09 SURGERY — INSERTION, TUNNELED CENTRAL VENOUS DEVICE, WITH PORT
Anesthesia: General | Laterality: Right | Wound class: Clean

## 2017-04-09 MED ORDER — LIDOCAINE HCL (PF) 2 % IJ SOLN
INTRAMUSCULAR | Status: AC
  Filled 2017-04-09: qty 6

## 2017-04-09 MED ORDER — CEFAZOLIN SODIUM-DEXTROSE 2-4 GM/100ML-% IV SOLN
2.0000 g | Freq: Once | INTRAVENOUS | Status: AC
  Administered 2017-04-09: 2 g via INTRAVENOUS

## 2017-04-09 MED ORDER — TRAMADOL HCL 50 MG PO TABS: 50.0000 mg | ORAL_TABLET | ORAL | Status: DC | PRN

## 2017-04-09 MED ORDER — SEVOFLURANE IN SOLN
RESPIRATORY_TRACT | Status: AC
  Filled 2017-04-09: qty 250

## 2017-04-09 MED ORDER — LACTATED RINGERS IV SOLN
INTRAVENOUS | Status: DC | PRN
Start: 2017-04-09 — End: 2017-04-09
  Administered 2017-04-09: 13:00:00 via INTRAVENOUS

## 2017-04-09 MED ORDER — HEPARIN SODIUM (PORCINE) 5000 UNIT/ML IJ SOLN
INTRAMUSCULAR | Status: AC
  Filled 2017-04-09: qty 1

## 2017-04-09 MED ORDER — FENTANYL CITRATE (PF) 100 MCG/2ML IJ SOLN
INTRAMUSCULAR | Status: AC
  Filled 2017-04-09: qty 2

## 2017-04-09 MED ORDER — PROPOFOL 10 MG/ML IV BOLUS
INTRAVENOUS | Status: DC | PRN
  Administered 2017-04-09: 200 mg via INTRAVENOUS

## 2017-04-09 MED ORDER — LIDOCAINE HCL (PF) 1 % IJ SOLN
INTRAMUSCULAR | Status: AC
  Filled 2017-04-09: qty 30

## 2017-04-09 MED ORDER — DEXAMETHASONE SODIUM PHOSPHATE 10 MG/ML IJ SOLN
INTRAMUSCULAR | Status: DC | PRN
  Administered 2017-04-09: 10 mg via INTRAVENOUS

## 2017-04-09 MED ORDER — BUPIVACAINE-EPINEPHRINE 0.5% -1:200000 IJ SOLN
INTRAMUSCULAR | Status: DC | PRN
  Administered 2017-04-09: 6 mL
  Administered 2017-04-09: 7 mL

## 2017-04-09 MED ORDER — SODIUM CHLORIDE 0.9 % IV SOLN
INTRAVENOUS | Status: DC | PRN
  Administered 2017-04-09: 10 mL via INTRAMUSCULAR

## 2017-04-09 MED ORDER — FENTANYL CITRATE (PF) 100 MCG/2ML IJ SOLN
25.0000 ug | INTRAMUSCULAR | Status: DC | PRN
  Administered 2017-04-09 (×4): 25 ug via INTRAVENOUS

## 2017-04-09 MED ORDER — LACTATED RINGERS IV SOLN
INTRAVENOUS | Status: DC
  Administered 2017-04-09: 13:00:00 via INTRAVENOUS

## 2017-04-09 MED ORDER — SODIUM CHLORIDE 0.9 % IJ SOLN
INTRAMUSCULAR | Status: AC
  Filled 2017-04-09: qty 50

## 2017-04-09 MED ORDER — FENTANYL CITRATE (PF) 100 MCG/2ML IJ SOLN
INTRAMUSCULAR | Status: AC
  Administered 2017-04-09: 25 ug via INTRAVENOUS
  Filled 2017-04-09: qty 2

## 2017-04-09 MED ORDER — ONDANSETRON HCL 4 MG/2ML IJ SOLN: 4.0000 mg | Freq: Once | INTRAMUSCULAR | Status: DC | PRN

## 2017-04-09 MED ORDER — LIDOCAINE HCL (CARDIAC) 20 MG/ML IV SOLN
INTRAVENOUS | Status: DC | PRN
  Administered 2017-04-09: 100 mg via INTRAVENOUS

## 2017-04-09 MED ORDER — FAMOTIDINE 20 MG PO TABS
20.0000 mg | ORAL_TABLET | Freq: Once | ORAL | Status: AC
  Administered 2017-04-09: 20 mg via ORAL

## 2017-04-09 MED ORDER — CEFAZOLIN SODIUM-DEXTROSE 2-4 GM/100ML-% IV SOLN
INTRAVENOUS | Status: AC
  Filled 2017-04-09: qty 100

## 2017-04-09 MED ORDER — ACETAMINOPHEN NICU IV SYRINGE 10 MG/ML
INTRAVENOUS | Status: AC
  Filled 2017-04-09: qty 1

## 2017-04-09 MED ORDER — DEXAMETHASONE SODIUM PHOSPHATE 10 MG/ML IJ SOLN
INTRAMUSCULAR | Status: AC
  Filled 2017-04-09: qty 1

## 2017-04-09 MED ORDER — BUPIVACAINE-EPINEPHRINE (PF) 0.5% -1:200000 IJ SOLN
INTRAMUSCULAR | Status: AC
  Filled 2017-04-09: qty 30

## 2017-04-09 MED ORDER — ACETAMINOPHEN 10 MG/ML IV SOLN
INTRAVENOUS | Status: DC | PRN
  Administered 2017-04-09: 1000 mg via INTRAVENOUS

## 2017-04-09 MED ORDER — FAMOTIDINE 20 MG PO TABS
ORAL_TABLET | ORAL | Status: AC
  Filled 2017-04-09: qty 1

## 2017-04-09 MED ORDER — ONDANSETRON HCL 4 MG/2ML IJ SOLN
INTRAMUSCULAR | Status: DC | PRN
  Administered 2017-04-09: 4 mg via INTRAVENOUS

## 2017-04-09 MED ORDER — FENTANYL CITRATE (PF) 100 MCG/2ML IJ SOLN
INTRAMUSCULAR | Status: DC | PRN
  Administered 2017-04-09 (×5): 25 ug via INTRAVENOUS

## 2017-04-09 MED ORDER — ONDANSETRON HCL 4 MG/2ML IJ SOLN
INTRAMUSCULAR | Status: AC
  Filled 2017-04-09: qty 2

## 2017-04-09 SURGICAL SUPPLY — 41 items
BLADE SURG 15 STRL LF DISP TIS (BLADE) ×2 IMPLANT
BLADE SURG 15 STRL SS (BLADE) ×2
BULB RESERV EVAC DRAIN JP 100C (MISCELLANEOUS) IMPLANT
CANISTER SUCT 1200ML W/VALVE (MISCELLANEOUS) ×4 IMPLANT
CHLORAPREP W/TINT 26ML (MISCELLANEOUS) ×4 IMPLANT
COVER LIGHT HANDLE STERIS (MISCELLANEOUS) ×8 IMPLANT
DERMABOND ADVANCED (GAUZE/BANDAGES/DRESSINGS) ×2
DERMABOND ADVANCED .7 DNX12 (GAUZE/BANDAGES/DRESSINGS) ×2 IMPLANT
DRAIN CHANNEL JP 15F RND 16 (MISCELLANEOUS) IMPLANT
DRAPE C-ARM XRAY 36X54 (DRAPES) ×4 IMPLANT
DRAPE LAPAROTOMY 100X77 ABD (DRAPES) ×4 IMPLANT
ELECT REM PT RETURN 9FT ADLT (ELECTROSURGICAL) ×4
ELECTRODE REM PT RTRN 9FT ADLT (ELECTROSURGICAL) ×2 IMPLANT
GAUZE SPONGE 4X4 12PLY STRL (GAUZE/BANDAGES/DRESSINGS) ×4 IMPLANT
GLOVE BIO SURGEON STRL SZ7.5 (GLOVE) ×4 IMPLANT
GOWN STRL REUS W/ TWL LRG LVL3 (GOWN DISPOSABLE) ×6 IMPLANT
GOWN STRL REUS W/TWL LRG LVL3 (GOWN DISPOSABLE) ×6
KIT PORT POWER 8FR ISP CVUE (Miscellaneous) ×4 IMPLANT
KIT RM TURNOVER STRD PROC AR (KITS) ×4 IMPLANT
LABEL OR SOLS (LABEL) ×4 IMPLANT
NEEDLE FILTER BLUNT 18X 1/2SAF (NEEDLE) ×2
NEEDLE FILTER BLUNT 18X1 1/2 (NEEDLE) ×2 IMPLANT
PACK BASIN MINOR ARMC (MISCELLANEOUS) ×4 IMPLANT
PACK PORT-A-CATH (MISCELLANEOUS) ×4 IMPLANT
SHEARS HARMONIC 9CM CVD (BLADE) IMPLANT
SUT CHROMIC 3 0 SH 27 (SUTURE) ×4 IMPLANT
SUT CHROMIC 4 0 RB 1X27 (SUTURE) ×4 IMPLANT
SUT ETH BLK MONO 3 0 FS 1 12/B (SUTURE) ×4 IMPLANT
SUT ETHILON 4-0 (SUTURE) ×2
SUT ETHILON 4-0 FS2 18XMFL BLK (SUTURE) ×2
SUT MNCRL 4-0 (SUTURE) ×2
SUT MNCRL 4-0 27XMFL (SUTURE) ×2
SUT MNCRL+ 5-0 UNDYED PC-3 (SUTURE) ×2 IMPLANT
SUT MONOCRYL 5-0 (SUTURE) ×2
SUT SILK 4 0 SH (SUTURE) ×4 IMPLANT
SUT VIC AB 5-0 RB1 27 (SUTURE) ×8 IMPLANT
SUTURE ETHLN 4-0 FS2 18XMF BLK (SUTURE) ×2 IMPLANT
SUTURE MNCRL 4-0 27XMF (SUTURE) ×2 IMPLANT
SYR 3ML LL SCALE MARK (SYRINGE) ×4 IMPLANT
SYRINGE 10CC LL (SYRINGE) ×4 IMPLANT
WATER STERILE IRR 1000ML POUR (IV SOLUTION) ×4 IMPLANT

## 2017-04-09 NOTE — Transfer of Care (Signed)
Immediate Anesthesia Transfer of Care Note  Patient: Julie Irwin  Procedure(s) Performed: Procedure(s): INSERTION PORT-A-CATH (Right) AXILLARY LYMPH NODE DISSECTION (Left)  Patient Location: PACU  Anesthesia Type:General  Level of Consciousness: sedated  Airway & Oxygen Therapy: Patient Spontanous Breathing and Patient connected to face mask oxygen  Post-op Assessment: Report given to RN and Post -op Vital signs reviewed and stable  Post vital signs: Reviewed and stable  Last Vitals:  Vitals:   04/09/17 1142 04/09/17 1520  BP: 101/70 119/71  Pulse: 93 81  Resp: 16 13  Temp: 36.7 C 36.7 C  SpO2: 99% 100%    Last Pain:  Vitals:   04/09/17 1142  TempSrc: Oral  PainSc: 5          Complications: No apparent anesthesia complications

## 2017-04-09 NOTE — Discharge Instructions (Signed)
AMBULATORY SURGERY  DISCHARGE INSTRUCTIONS   1) The drugs that you were given will stay in your system until tomorrow so for the next 24 hours you should not:  A) Drive an automobile B) Make any legal decisions C) Drink any alcoholic beverage   2) You may resume regular meals tomorrow.  Today it is better to start with liquids and gradually work up to solid foods.  You may eat anything you prefer, but it is better to start with liquids, then soup and crackers, and gradually work up to solid foods.   3) Please notify your doctor immediately if you have any unusual bleeding, trouble breathing, redness and pain at the surgery site, drainage, fever, or pain not relieved by medication. 4)   5) Your post-operative visit with Dr.                                     is: Date:                        Time:    Please call to schedule your post-operative visit.  6) Additional Instructions:. Take Tylenol and or tramadol if needed for pain. Should not drive or do anything dangerous and taken tramadol. May shower and blot dry.

## 2017-04-09 NOTE — Anesthesia Post-op Follow-up Note (Signed)
Anesthesia QCDR form completed.        

## 2017-04-09 NOTE — Op Note (Signed)
OPERATIVE REPORT  PREOPERATIVE  DIAGNOSIS: .malignant pericardial effusion, left axillary adenopathy  POSTOPERATIVE DIAGNOSIS: .malignant pericardial effusion, left axillary adenopathy  PROCEDURE: .insertion of central venous catheter with subcutaneous infusion port, excision left axillary lymph node  ANESTHESIA:  General  SURGEON: Rochel Brome  MD   INDICATIONS: .Julie Irwin She has a past history of breast cancer. She recently had findings of pericardial effusion and cytology was consistent with malignancy. She also had recent PET scan findings of left axillary adenopathy.excision of left axillary lymph node was recommended for further evaluation. Port-A-Cath was recommended for intravenous access for chemotherapy.  With the patient on the operating table in the supine position under general anesthesia a rolled sheet was placed behind her shoulder blades to extend the neck. The neck and right anterior chest wall were prepared with ChloraPrep and draped in a sterile manner. The skin underneath the clavicle was infiltrated with half percent Sensorcaine with epinephrine. A transversely oriented 3 cm incision was made and carried down through subcutaneous tissues to the deep fascia. A subcutaneous pouch was created large enough to admit the ClearView port.. The right internal jugular vein was identified with ultrasound. The skin overlying the vein was infiltrated with half percent Sensorcaine with epinephrine. A lancing 5 mm incision was made. With the patient inTrendelenburg position using ultrasound guidance a needle was inserted into the jugular vein. A guidewire was inserted and the needle withdrawn. An ultrasound image was saved for the paper chart. Fluoroscopy was used to demonstrate the position of the guidewire. There was a U-shaped curve and the guidewire and it appeared that the tip of the guidewire was adherent to tissue at the base f the neck. This was manipulated with fluoroscopy however subsequently  took the wire out and was reinserted with the same technique. This time the guidewire passed satisfactorily through the vena cava. The dilator and introduce sheath were advanced over the guidewire. The guidewire was removed. The dilator was removed. Next the catheter was inserted through the sheath and the sheath was peeled away. Fluoroscopy was used to position the catheter in the superior vena cava at a point 13cm from the cervical skin incision. A fluoroscopic image was saved. The catheter was tunneled to the subclavian incision and pressure held over the tunnel site. Next the catheter was cut to fit and attached to the clear view port and placed into the subcutaneous pouch. It was sutured to the deep fascia with 4-0 silk. The port was accessed with a Huber needle and aspirated a trace of blood and flushed with heparinized saline solution. Hemostasis was intact. Subcutaneous tissues were approximated with interrupted 5-0 Vicryl. The skin was closed with running 50 Monocryl subcuticular suture and Dermabond  The patient was in satisfactory condition and towels and sheets were removed. Next with the left arm placed on a lateral arm support. The left axilla and surrounding chest wall and upper arm were prepared with ChloraPrep and draped in a sterile manner. An oblique incision was made in the inferior aspect of the axilla and carried down through subcutaneous tissues through superficial fascia deeply within the axilla encountering a firm palpable enlarged lymph node approximately 2 cm in dimension. One traversing vein was divided between 4-0 chromic suture ligatures.This lymph node appeared to be densely adherent to the surrounding tissues and was dissected free from surrounding structures with use of blunt dissection and also use of electrocautery. The vascular pedicle was suture ligated with 3-0 chromic. The lymph node was submitted fresh for routine pathology.  The wound was inspected and hemostasis was intact.  Subcuticular tissues were infiltrated with half percent Sensorcaine with epinephrine. Subcutaneous tissues were approximated with interrupted 3-0 chromic. The skin was closed with running 4-0 Monocryl subcuticular suture and Dermabond  The patient tolerated surgery satisfactorily and was then prepared for transfer to the recovery room  [employer].D.

## 2017-04-09 NOTE — Progress Notes (Signed)
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   INTERVAL HISTORY: Patient returns to clinic today for further evaluation, discussion of her pathology results, and treatment planning. She is highly anxious, but otherwise feels well. She has no further shortness of breath.  She has no neurologic complaints. She has a good appetite and has maintained her weight.  She has no chest pain, cough, or hemoptysis.  She has no nausea, vomiting, constipation, or diarrhea.  Patient offers no specific complaints today.  REVIEW OF SYSTEMS:   Review of Systems  Constitutional: Negative.  Negative for fever, malaise/fatigue and weight loss.  Respiratory: Negative.  Negative for cough and shortness of breath.   Cardiovascular: Negative.  Negative for chest pain and leg swelling.  Gastrointestinal: Negative.  Negative for abdominal pain.  Genitourinary: Negative.   Musculoskeletal: Negative.   Skin: Negative.  Negative for rash.  Neurological: Negative.  Negative for sensory change and weakness.  Psychiatric/Behavioral: The patient is nervous/anxious.    As per HPI. Otherwise, a complete review of systems is negative.   PAST MEDICAL HISTORY:  Hypothyroidism, migraines, depression, asthma.  PAST SURGICAL HISTORY: Bilateral mastectomy with reconstruction, partial hysterectomy.  FAMILY HISTORY: Lung cancer, melanoma, stomach cancer.  Also diabetes, CAD, hypertension     ADVANCED DIRECTIVES:    HEALTH MAINTENANCE: Social History  Substance Use Topics  . Smoking  status: Never Smoker  . Smokeless tobacco: Never Used  . Alcohol use No     Colonoscopy:  PAP:  Bone density:  Lipid panel:  Allergies  Allergen Reactions  . Codeine Other (See Comments)    constipation  . Sulfa Antibiotics Other (See Comments)    Allergy as a child    Current Outpatient Prescriptions  Medication Sig Dispense Refill  . alendronate (FOSAMAX) 70 MG tablet Take 70 mg by mouth once a week. Take with a full glass of water on an empty stomach.     . ALPRAZolam (XANAX) 0.25 MG tablet Take 1 tablet by mouth 3 (three) times daily as needed for anxiety.     Marland Kitchen azelastine (ASTELIN) 0.1 % nasal spray Place 1 spray into both nostrils daily as needed for rhinitis. Use in each nostril as directed    . calcium-vitamin D (OSCAL WITH D) 250-125 MG-UNIT tablet Take 1 tablet by mouth daily.    . Cholecalciferol (D3 HIGH POTENCY) 2000 units CAPS Take 1 capsule by mouth daily.    . cyclobenzaprine (FLEXERIL) 10 MG tablet Take 10 mg by mouth 3 (three) times daily as needed.     . DULoxetine (CYMBALTA) 30 MG capsule Take 90 mg by mouth every morning.     . fluticasone (FLOVENT HFA) 110 MCG/ACT inhaler Inhale 1 puff into the lungs 2 (two) times daily.    Marland Kitchen levothyroxine (SYNTHROID, LEVOTHROID) 137 MCG tablet Take 137 mcg by mouth daily before breakfast.     . metoprolol succinate (TOPROL-XL) 25 MG 24 hr tablet Take 75 mg by mouth at bedtime.     . Multiple Vitamin (MULTIVITAMIN) tablet Take 1 tablet by mouth daily.    . prochlorperazine (COMPAZINE) 10 MG tablet  Take 10 mg by mouth every 6 (six) hours as needed for nausea or vomiting.    . topiramate (TOPAMAX) 50 MG tablet Take 50 mg by mouth at bedtime.     . traMADol (ULTRAM) 50 MG tablet Take 25-50 mg by mouth every 6 (six) hours as needed.      No current facility-administered medications for this visit.     OBJECTIVE: Vitals:   04/11/17 1130  BP: 99/68  Pulse: 81  Temp: 98.6 F (37 C)     Body mass index is 39.04 kg/m.     ECOG FS:0 - Asymptomatic  General: Well-developed, well-nourished, no acute distress. Eyes: anicteric sclera. Breasts: Patient has bilateral mastectomies with reconstruction, no obvious evidence of local recurrence. Lungs: Clear to auscultation bilaterally. Port in place in the right chest wall. Heart: Regular rate and rhythm. No rubs, murmurs, or gallops. Abdomen: Soft, nontender, nondistended. No organomegaly noted, normoactive bowel sounds. Musculoskeletal: No edema, cyanosis, or clubbing. Neuro: Alert, answering all questions appropriately. Cranial nerves grossly intact. Skin: No rashes or petechiae noted. Psych: Normal affect.   LAB RESULTS:  Lab Results  Component Value Date   NA 141 03/24/2017   K 4.0 03/24/2017   CL 107 03/24/2017   CO2 26 03/24/2017   GLUCOSE 129 (H) 03/24/2017   BUN 17 03/24/2017   CREATININE 0.71 03/24/2017   CALCIUM 8.8 (L) 03/24/2017   PROT 6.5 03/19/2017   ALBUMIN 3.9 03/18/2017   AST 46 (H) 03/18/2017   ALT 60 (H) 03/18/2017   ALKPHOS 72 03/18/2017   BILITOT 1.2 03/18/2017   GFRNONAA >60 03/24/2017   GFRAA >60 03/24/2017    Lab Results  Component Value Date   WBC 10.3 03/24/2017   NEUTROABS 9.3 (H) 03/18/2017   HGB 13.2 03/24/2017   HCT 39.6 03/24/2017   MCV 95.0 03/24/2017   PLT 313 03/24/2017     STUDIES: Ct Angio Chest Pe W Or Wo Contrast  Result Date: 03/18/2017 CLINICAL DATA:  Tachycardia and dyspnea. EXAM: CT ANGIOGRAPHY CHEST WITH CONTRAST TECHNIQUE: Multidetector CT imaging of the chest was performed using the standard protocol during bolus administration of intravenous contrast. Multiplanar CT image reconstructions and MIPs were obtained to evaluate the vascular anatomy. CONTRAST:  75 cc Isovue 370 IV COMPARISON:  Same day CXR FINDINGS: Cardiovascular: Large pericardial effusion, anteriorly measuring up to 1.9 cm in thickness, 2.9 cm in thickness at the cardiac apex and posteriorly at 2.2 cm in thickness. The heart is otherwise  normal in size. There is good opacification of the pulmonary arteries. No pulmonary embolus is identified. No aortic aneurysm or dissection. Minimal aortic atherosclerosis. Mediastinum/Nodes: No enlarged mediastinal, hilar, or axillary lymph nodes. Thyroid gland, trachea, and esophagus demonstrate no significant findings. Lungs/Pleura: Bilateral small to moderate pleural effusions with adjacent atelectasis. No dominant mass. Faint bilateral ground-glass opacities may represent stigmata of pulmonary edema or hypoventilatory change among some possibilities though not exclusive. Upper Abdomen: Bilateral saline subglandular implants with mild bilateral skin thickening and soft tissue induration about the left breast. Clinical correlation recommended. Musculoskeletal: Dextroscoliosis of the thoracic spine without acute nor suspicious osseous abnormality. Review of the MIP images confirms the above findings. IMPRESSION: 1. Large pericardial effusion.  Cardiology assessment recommended. 2. Small bilateral pleural effusions with atelectasis and ground-glass opacities likely representing stigmata of CHF. 3. No acute pulmonary embolus. 4. Bilateral skin thickening and subcutaneous fatty induration of the left breast with underlying bilateral saline subglandular implants. Clinical correlation recommended. Aortic Atherosclerosis (ICD10-I70.0). Electronically Signed  By: Ashley Royalty M.D.   On: 03/18/2017 20:09   Mr Jeri Cos EX Contrast  Result Date: 04/03/2017 CLINICAL DATA:  Breast cancer, here for restaging. Assess for intracranial metastasis. EXAM: MRI HEAD WITHOUT AND WITH CONTRAST TECHNIQUE: Multiplanar, multiecho pulse sequences of the brain and surrounding structures were obtained without and with intravenous contrast. CONTRAST:  20 cc MultiHance COMPARISON:  None available for comparison at time of study interpretation. CT HEAD report reviewed though images are not available for direct comparison. FINDINGS:  INTRACRANIAL CONTENTS: Four supratentorial parenchymal cystic metastasis with peripheral reduced diffusion consistent with hypercellular tumor as follows: 12 mm LEFT putaminal, 8 mm LEFT internal capsule posterior limb, 8 mm splenium of corpus callosum, 14 mm LEFT parietal lobe.Minimal surrounding FLAIR T2 hyperintense vasogenic edema. In addition, 14 mm solidly enhancing, low T2 RIGHT frontal dural based mass with dural tail and susceptibility artifact most compatible with meningioma. No midline shift or mass effect. Ventricles and sulci are normal for patient's age. No abnormal extra-axial fluid collection. VASCULAR: Normal major intracranial vascular flow voids present at skull base. SKULL AND UPPER CERVICAL SPINE: No abnormal sellar expansion. No suspicious calvarial bone marrow signal. Craniocervical junction maintained. SINUSES/ORBITS: The mastoid air-cells and included paranasal sinuses are well-aerated.The included ocular globes and orbital contents are non-suspicious. OTHER: None. IMPRESSION: 1. Four supratentorial parenchymal metastasis, largest in LEFT parietal lobe measuring 14 mm. No significant mass effect. 2. 14 mm RIGHT frontal meningioma. 3. Otherwise negative MRI of the head with and without contrast. These results will be called to the ordering clinician or representative by the Radiologist Assistant, and communication documented in the PACS or zVision Dashboard. Electronically Signed   By: Elon Alas M.D.   On: 04/03/2017 14:21   Nm Myocar Multi W/spect W/wall Motion / Ef  Result Date: 04/01/2017  T wave inversion was noted during stress in the I, II, III, aVF, V2, V3, V4, V5 and V6 leads.  There was no ST segment deviation noted during stress.  The study is normal.  This is a low risk study.  The left ventricular ejection fraction is normal (55-65%).    Nm Pet Image Initial (pi) Skull Base To Thigh  Result Date: 04/03/2017 CLINICAL DATA:  Initial treatment strategy for left  breast cancer. EXAM: NUCLEAR MEDICINE PET SKULL BASE TO THIGH TECHNIQUE: 12.5 mCi F-18 FDG was injected intravenously. Full-ring PET imaging was performed from the skull base to thigh after the radiotracer. CT data was obtained and used for attenuation correction and anatomic localization. FASTING BLOOD GLUCOSE:  Value: 95 mg/dl COMPARISON:  Chest CT 03/18/2017 FINDINGS: NECK: Diffuse increased uptake in the thyroid gland likely due to inflammations/thyroiditis. 5 x 8 mm left-sided supraclavicular node on image number 57 is metabolically active with SUV max of 3.6. No neck mass or neck nodes. CHEST: A discrete left breast mass is not identified. There is mild skin thickening and subcutaneous interstitial changes which are mildly hypermetabolic. Multiple enlarged hypermetabolic left axillary lymph nodes are noted. The largest measures 23 x 13.5 mm on image number 69. SUV max is 12.9. 9 mm node on image number 79 has SUV max of 10.5. 5 mm prevascular lymph node better seen on prior chest CT on image number 19 has an SUV max of 3.6. The 4 mm right middle lobe pulmonary nodule on image number 107 is indeterminate. There also small pulmonary nodules noted in the right upper lobe on image number 92 and near the right major fissure on image number 96.  Small pulmonary metastasis are possible. Attention on future scans is suggested. No areas of hypermetabolism. ABDOMEN/PELVIS: The lateral limb of the right adrenal gland is thickened and is hypermetabolic with SUV max of 6.73. Several small but metabolically active retroperitoneal lymph nodes are noted. 6 mm node between the aorta and IVC on image number 148 has an SUV max of 5.6. Smaller noted anterior to the IVC near the pancreatic head has SUV max of 4.3. Slightly lower interaortocaval lymph node on image number 152 measures 7 mm and has SUV max of 6.0. Small left-sided retroperitoneal lymph nodes are also hypermetabolic. The 7 mm node on image number 163 has an SUV max of  6.5. Incidental findings include multiple left-sided renal calculi and a left ovarian cyst. SKELETON: No findings for osseous metastatic disease. IMPRESSION: 1. A discrete left breast mass is not identified but there are enlarged and hypermetabolic left axillary lymph nodes consistent with metastatic adenopathy. 2. Small left supraclavicular lymph node is hypermetabolic and worrisome for metastatic adenopathy. There is also a metabolically active small prevascular lymph node and small pulmonary nodules suspicious for metastasis. 3. Numerous small retroperitoneal lymph nodes are hypermetabolic and concerning for metastatic disease. 4. Slightly thickened right adrenal gland without discrete mass is hypermetabolic and suspicious for metastasis. 5. No findings to suggest hepatic or osseous metastatic disease. Electronically Signed   By: Marijo Sanes M.D.   On: 04/03/2017 13:59   Dg Chest Port 1 View  Result Date: 04/09/2017 CLINICAL DATA:  Status post Port-A-Cath insertion. EXAM: PORTABLE CHEST 1 VIEW COMPARISON:  03/21/2017 FINDINGS: New right anterior chest wall Port-A-Cath. The catheter extends to the neck base through the internal jugular vein. Tip projects at the caval atrial junction. No pneumothorax. Clear lungs. Cardiac silhouette is normal in size. No mediastinal or hilar masses or evidence of adenopathy. IMPRESSION: 1. Right anterior chest wall Port-A-Cath catheter tip projects at the caval atrial junction, well positioned. 2. No pneumothorax. 3. No acute cardiopulmonary disease. Electronically Signed   By: Lajean Manes M.D.   On: 04/09/2017 17:30   Dg Chest Port 1 View  Result Date: 03/21/2017 CLINICAL DATA:  61 year old female with shortness of Breath. Pericardiocentesis 2 days ago. Pericardial and pleural effusions. EXAM: PORTABLE CHEST 1 VIEW COMPARISON:  03/19/2017 and earlier. FINDINGS: Portable AP upright view at 0808 hours. Bilateral breast implants incidentally noted. Continued veiling  opacity at the left lung base and indistinct appearance of the lower heart border. A drainage catheter continues to project over the cardiac silhouette and appears stable. Small right pleural effusion appears stable. Decreased pulmonary vascularity. No pneumothorax. Other mediastinal contours are stable. Visualized tracheal air column is within normal limits. IMPRESSION: 1. Stable pericardial catheter and cardiac size since 03/19/2017. 2. Decreased pulmonary vascularity, no acute edema. 3. Continued confluent opacity at the left lung base, probably a combination of pleural effusion and atelectasis or consolidation. 4. No new cardiopulmonary abnormality. Electronically Signed   By: Genevie Ann M.D.   On: 03/21/2017 08:48   Dg Chest Port 1 View  Result Date: 03/19/2017 CLINICAL DATA:  Chest pain EXAM: PORTABLE CHEST 1 VIEW COMPARISON:  03/18/2017 chest radiograph. FINDINGS: Pericardial drain terminates over the right heart. Mildly enlarged cardiopericardial silhouette, slightly decreased in size. Otherwise stable mediastinal contour. No pneumothorax. Stable trace right and small left pleural effusions. No overt pulmonary edema. Patchy bibasilar lung opacities, left greater than right, stable. IMPRESSION: 1. No pneumothorax. 2. Stable small left and trace right pleural effusions and left greater than right  bibasilar lung opacities. 3. Pericardial drain terminates over the right heart. Mildly enlarged cardiopericardial silhouette, slightly decreased. Electronically Signed   By: Ilona Sorrel M.D.   On: 03/19/2017 13:18   Dg Chest Port 1 View  Result Date: 03/18/2017 CLINICAL DATA:  Tachycardia and pain EXAM: PORTABLE CHEST 1 VIEW COMPARISON:  None. FINDINGS: Cardiac shadow is enlarged. Increased density is noted over the left base in part related to overlying soft tissue although some effusion and infiltrate is likely present as well. No acute bony abnormality is seen. IMPRESSION: Increased density over the left base  likely representing a combination of effusion and infiltrate. Two-view chest is recommended when the patient's condition allows. Electronically Signed   By: Inez Catalina M.D.   On: 03/18/2017 15:07   Dg C-arm 1-60 Min-no Report  Result Date: 04/09/2017 Fluoroscopy was utilized by the requesting physician.  No radiographic interpretation.    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   PLAN:    1. 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 Patient to have extensive lymphatic disease as well as brain metastasis. Case discussed at length with pathology confirming malignancy in pericardial fluid, although cells stained ER and HER-2 negative. They are GATA-3 positive suggesting breast cancer. Patient's CA-27-29 is now mildly elevated at 65.6. Patient had an additional biopsy earlier this week which revealed a poorly differentiated carcinoma that was ER/PR negative. The remainder of her stains and pathology is pending at time of dictation, but appears to be consistent with the cells from her pericardial fluid. Foundation 1 testing is pending at time of dictation. Patient will initiate XRT to her brain in 1 week. After lengthy discussion, she wishes to pursue palliative chemotherapy and will initially single agent Taxol on days 1, 8, 15 with day 22 off. Plan to do 3 months of treatment and then reimage in November 2018. Return to clinic on Wednesday, April 17, 2017 for consideration of cycle 1, day 1.  2. Brain metastasis:  Patient was given a referral to radiation oncology. 3. 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 Continue follow-up with cardiology as indicated.  Patient expressed understanding and was in agreement with this plan. She also understands that She can call clinic at any time with any questions, concerns, or complaints.   Approximately 30 minutes was spent in discussion of which greater than 50% was consultation.   Breast cancer   Staging form: Breast, AJCC 7th Edition     Clinical stage from 02/14/2015: Stage IA (T1c, N0, M0) - Signed by Lloyd Huger, MD on 02/14/2015   Lloyd Huger, MD   04/12/2017 9:17 AM

## 2017-04-09 NOTE — Anesthesia Preprocedure Evaluation (Signed)
Anesthesia Evaluation  Patient identified by MRN, date of birth, ID band Patient awake    Reviewed: Allergy & Precautions, NPO status , Patient's Chart, lab work & pertinent test results  History of Anesthesia Complications Negative for: history of anesthetic complications  Airway Mallampati: II       Dental  (+) Partial Upper   Pulmonary asthma ,           Cardiovascular (-) hypertension(-) Past MI and (-) CHF (-) dysrhythmias (-) Valvular Problems/Murmurs  Pt with pericardial effusion. Drained 8/6. Related to the breast ca.   Neuro/Psych Anxiety Depression    GI/Hepatic Neg liver ROS, neg GERD  ,  Endo/Other  neg diabetesHypothyroidism   Renal/GU negative Renal ROS     Musculoskeletal   Abdominal   Peds  Hematology  (+) anemia ,   Anesthesia Other Findings   Reproductive/Obstetrics                            Anesthesia Physical Anesthesia Plan  ASA: III  Anesthesia Plan: General   Post-op Pain Management:    Induction:   PONV Risk Score and Plan: 3 and Ondansetron, Dexamethasone, Midazolam and Treatment may vary due to age or medical condition  Airway Management Planned: LMA  Additional Equipment:   Intra-op Plan:   Post-operative Plan:   Informed Consent: I have reviewed the patients History and Physical, chart, labs and discussed the procedure including the risks, benefits and alternatives for the proposed anesthesia with the patient or authorized representative who has indicated his/her understanding and acceptance.     Plan Discussed with:   Anesthesia Plan Comments:         Anesthesia Quick Evaluation

## 2017-04-09 NOTE — H&P (Signed)
  She comes in today prepared for insertion of central venous catheter with subcutaneous infusion port. She also is anticipating excision of left axillary lymph node. I reviewed her history of breast cancer and recent findings of malignant pericardial effusion and metastasis in her brain.  I discussed the plan for surgery insert the port on the right side and removing the node on the left side and the sites were marked YES

## 2017-04-09 NOTE — Anesthesia Procedure Notes (Signed)
Procedure Name: LMA Insertion Date/Time: 04/09/2017 1:08 PM Performed by: Timoteo Expose Pre-anesthesia Checklist: Patient identified, Emergency Drugs available, Suction available, Patient being monitored and Timeout performed Patient Re-evaluated:Patient Re-evaluated prior to induction Oxygen Delivery Method: Circle system utilized Preoxygenation: Pre-oxygenation with 100% oxygen Induction Type: IV induction Ventilation: Mask ventilation without difficulty LMA: LMA inserted LMA Size: 4.5 Placement Confirmation: positive ETCO2,  CO2 detector and breath sounds checked- equal and bilateral Dental Injury: Teeth and Oropharynx as per pre-operative assessment

## 2017-04-10 ENCOUNTER — Encounter: Payer: Self-pay | Admitting: Surgery

## 2017-04-10 ENCOUNTER — Inpatient Hospital Stay: Payer: 59 | Admitting: Oncology

## 2017-04-11 ENCOUNTER — Inpatient Hospital Stay (HOSPITAL_BASED_OUTPATIENT_CLINIC_OR_DEPARTMENT_OTHER): Payer: 59 | Admitting: Oncology

## 2017-04-11 ENCOUNTER — Encounter: Payer: Self-pay | Admitting: Oncology

## 2017-04-11 VITALS — BP 99/68 | HR 81 | Temp 98.6°F | Wt 220.4 lb

## 2017-04-11 DIAGNOSIS — J309 Allergic rhinitis, unspecified: Secondary | ICD-10-CM | POA: Insufficient documentation

## 2017-04-11 DIAGNOSIS — E785 Hyperlipidemia, unspecified: Secondary | ICD-10-CM | POA: Insufficient documentation

## 2017-04-11 DIAGNOSIS — R600 Localized edema: Secondary | ICD-10-CM | POA: Insufficient documentation

## 2017-04-11 DIAGNOSIS — I314 Cardiac tamponade: Secondary | ICD-10-CM | POA: Diagnosis not present

## 2017-04-11 DIAGNOSIS — F329 Major depressive disorder, single episode, unspecified: Secondary | ICD-10-CM

## 2017-04-11 DIAGNOSIS — R3129 Other microscopic hematuria: Secondary | ICD-10-CM | POA: Insufficient documentation

## 2017-04-11 DIAGNOSIS — M5136 Other intervertebral disc degeneration, lumbar region: Secondary | ICD-10-CM | POA: Insufficient documentation

## 2017-04-11 DIAGNOSIS — F419 Anxiety disorder, unspecified: Secondary | ICD-10-CM

## 2017-04-11 DIAGNOSIS — Z800 Family history of malignant neoplasm of digestive organs: Secondary | ICD-10-CM

## 2017-04-11 DIAGNOSIS — I313 Pericardial effusion (noninflammatory): Secondary | ICD-10-CM | POA: Diagnosis not present

## 2017-04-11 DIAGNOSIS — E039 Hypothyroidism, unspecified: Secondary | ICD-10-CM | POA: Insufficient documentation

## 2017-04-11 DIAGNOSIS — J90 Pleural effusion, not elsewhere classified: Secondary | ICD-10-CM | POA: Diagnosis not present

## 2017-04-11 DIAGNOSIS — Z9013 Acquired absence of bilateral breasts and nipples: Secondary | ICD-10-CM

## 2017-04-11 DIAGNOSIS — J45909 Unspecified asthma, uncomplicated: Secondary | ICD-10-CM

## 2017-04-11 DIAGNOSIS — C50411 Malignant neoplasm of upper-outer quadrant of right female breast: Secondary | ICD-10-CM | POA: Diagnosis not present

## 2017-04-11 DIAGNOSIS — Z170 Estrogen receptor positive status [ER+]: Secondary | ICD-10-CM | POA: Diagnosis not present

## 2017-04-11 DIAGNOSIS — I700 Atherosclerosis of aorta: Secondary | ICD-10-CM

## 2017-04-11 DIAGNOSIS — M810 Age-related osteoporosis without current pathological fracture: Secondary | ICD-10-CM | POA: Diagnosis not present

## 2017-04-11 DIAGNOSIS — R0602 Shortness of breath: Secondary | ICD-10-CM

## 2017-04-11 DIAGNOSIS — C7931 Secondary malignant neoplasm of brain: Secondary | ICD-10-CM

## 2017-04-11 DIAGNOSIS — Z801 Family history of malignant neoplasm of trachea, bronchus and lung: Secondary | ICD-10-CM

## 2017-04-11 DIAGNOSIS — C50919 Malignant neoplasm of unspecified site of unspecified female breast: Secondary | ICD-10-CM | POA: Insufficient documentation

## 2017-04-11 DIAGNOSIS — Z79899 Other long term (current) drug therapy: Secondary | ICD-10-CM

## 2017-04-11 NOTE — Anesthesia Postprocedure Evaluation (Signed)
Anesthesia Post Note  Patient: Julie Irwin  Procedure(s) Performed: Procedure(s) (LRB): INSERTION PORT-A-CATH (Right) AXILLARY LYMPH NODE DISSECTION (Left)  Patient location during evaluation: PACU Anesthesia Type: General Level of consciousness: awake and alert and oriented Pain management: pain level controlled Vital Signs Assessment: post-procedure vital signs reviewed and stable Respiratory status: spontaneous breathing, nonlabored ventilation and respiratory function stable Cardiovascular status: blood pressure returned to baseline and stable Postop Assessment: no signs of nausea or vomiting Anesthetic complications: no     Last Vitals:  Vitals:   04/09/17 1616 04/09/17 1634  BP: 115/77 (!) 105/51  Pulse: 86 82  Resp: 16 16  Temp: 36.7 C   SpO2: 96% 100%    Last Pain:  Vitals:   04/10/17 0828  TempSrc:   PainSc: 7                  Loxley Schmale

## 2017-04-12 ENCOUNTER — Telehealth: Payer: Self-pay | Admitting: *Deleted

## 2017-04-12 ENCOUNTER — Encounter: Payer: Self-pay | Admitting: *Deleted

## 2017-04-12 DIAGNOSIS — Z7189 Other specified counseling: Secondary | ICD-10-CM | POA: Insufficient documentation

## 2017-04-12 DIAGNOSIS — C50411 Malignant neoplasm of upper-outer quadrant of right female breast: Secondary | ICD-10-CM

## 2017-04-12 MED ORDER — PROCHLORPERAZINE MALEATE 10 MG PO TABS: 10.0000 mg | ORAL_TABLET | Freq: Four times a day (QID) | ORAL | 3 refills | Status: DC | PRN

## 2017-04-12 MED ORDER — LIDOCAINE-PRILOCAINE 2.5-2.5 % EX CREA: TOPICAL_CREAM | CUTANEOUS | 3 refills | Status: DC

## 2017-04-12 MED ORDER — ONDANSETRON HCL 8 MG PO TABS: 8.0000 mg | ORAL_TABLET | Freq: Two times a day (BID) | ORAL | 3 refills | Status: AC | PRN

## 2017-04-12 MED ORDER — ONDANSETRON HCL 8 MG PO TABS: 8.0000 mg | ORAL_TABLET | Freq: Two times a day (BID) | ORAL | 3 refills | Status: DC | PRN

## 2017-04-12 NOTE — Patient Instructions (Signed)
[facility] orders faxed.

## 2017-04-12 NOTE — Telephone Encounter (Signed)
Dr Dicie Beam called with biopsy results. Metastatic disease consistent with breast origin, ER/PR negative, HER 2 2+ so sending it for FISH. She said you can call her back if you like

## 2017-04-12 NOTE — Telephone Encounter (Signed)
Patient reports that her Emla, compazine and Zofran were not sent to [facility]. When I checked this, they were sent to mail order. I called [facility] and cancelled it and then resubmitted to [facility]. Patient informed

## 2017-04-12 NOTE — Progress Notes (Signed)
START ON PATHWAY REGIMEN - Breast     A cycle is every 28 days (3 weeks on and 1 week off):     Paclitaxel   **Always confirm dose/schedule in your pharmacy ordering system**    Patient Characteristics: Metastatic Chemotherapy, HER2 Negative/Unknown/Equivocal, ER Negative/Unknown, Second Line, No Prior Paclitaxel Therapeutic Status: Distant Metastases BRCA Mutation Status: Did Not Order Test ER Status: Negative (-) HER2 Status: Negative (-) Would you be surprised if this patient died  in the next year? I would be surprised if this patient died in the next year PR Status: Negative (-) Line of therapy: Second Line Intent of Therapy: Non-Curative / Palliative Intent, Discussed with Patient

## 2017-04-12 NOTE — Progress Notes (Addendum)
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   INTERVAL HISTORY: Patient returns to clinic today for further evaluation and initiation of palliative chemotherapy. She is anxious, but otherwise feels well. She has no further shortness of breath.  She has no neurologic complaints. She denies any recent fevers or illnesses. She has a good appetite and has maintained her weight.  She has no chest pain, cough, or hemoptysis.  She has no nausea, vomiting, constipation, or diarrhea.  She has no urinary complaints. Patient offers no specific complaints today.  REVIEW OF SYSTEMS:   Review of Systems  Constitutional: Negative.  Negative for fever, malaise/fatigue and weight loss.  Respiratory: Negative.  Negative for cough and shortness of breath.   Cardiovascular: Negative.  Negative for chest pain and leg swelling.  Gastrointestinal: Negative.  Negative for abdominal pain.  Genitourinary: Negative.   Musculoskeletal: Negative.   Skin: Negative.  Negative for rash.  Neurological: Negative.  Negative for sensory change and weakness.  Psychiatric/Behavioral: The patient is nervous/anxious.    As per HPI. Otherwise, a complete review of systems is negative.   PAST MEDICAL HISTORY:  Hypothyroidism, migraines, depression, asthma.  PAST SURGICAL HISTORY: Bilateral mastectomy with reconstruction, partial hysterectomy.  FAMILY HISTORY: Lung cancer, melanoma, stomach cancer.  Also diabetes, CAD, hypertension     ADVANCED DIRECTIVES:    HEALTH  MAINTENANCE: Social History  Substance Use Topics  . Smoking status: Never Smoker  . Smokeless tobacco: Never Used  . Alcohol use No     Colonoscopy:  PAP:  Bone density:  Lipid panel:  Allergies  Allergen Reactions  . Codeine Other (See Comments)    constipation  . Sulfa Antibiotics Other (See Comments)    Allergy as a child    Current Outpatient Prescriptions  Medication Sig Dispense Refill  . alendronate (FOSAMAX) 70 MG tablet Take 70 mg by mouth once a week. Take with a full glass of water on an empty stomach.     . ALPRAZolam (XANAX) 0.25 MG tablet Take 1 tablet by mouth 3 (three) times daily as needed for anxiety.     . calcium-vitamin D (OSCAL WITH D) 250-125 MG-UNIT tablet Take 1 tablet by mouth daily.    . Cholecalciferol (D3 HIGH POTENCY) 2000 units CAPS Take 1 capsule by mouth daily.    . cyclobenzaprine (FLEXERIL) 10 MG tablet Take 10 mg by mouth 3 (three) times daily as needed.     . DULoxetine (CYMBALTA) 30 MG capsule Take 90 mg by mouth every morning.     . fluticasone (FLOVENT HFA) 110 MCG/ACT inhaler Inhale 1 puff into the lungs 2 (two) times daily.    Marland Kitchen levothyroxine (SYNTHROID, LEVOTHROID) 137 MCG tablet Take 137 mcg by mouth daily before breakfast.     . lidocaine-prilocaine (EMLA) cream Apply to affected area once 30 g 3  . metoprolol succinate (TOPROL-XL) 25 MG 24 hr tablet Take 75 mg by mouth at bedtime.     . Multiple Vitamin (MULTIVITAMIN) tablet Take 1 tablet by mouth daily.    . ondansetron (ZOFRAN) 8 MG tablet Take 1 tablet (8 mg  total) by mouth 2 (two) times daily as needed (Nausea or vomiting). 60 tablet 3  . prochlorperazine (COMPAZINE) 10 MG tablet Take 1 tablet (10 mg total) by mouth every 6 (six) hours as needed (Nausea or vomiting). 60 tablet 3  . topiramate (TOPAMAX) 50 MG tablet Take 50 mg by mouth at bedtime.     . traMADol (ULTRAM) 50 MG tablet Take 25-50 mg by mouth every 6 (six) hours as needed.     Marland Kitchen azelastine (ASTELIN) 0.1 % nasal  spray Place 1 spray into both nostrils daily as needed for rhinitis. Use in each nostril as directed     No current facility-administered medications for this visit.    Facility-Administered Medications Ordered in Other Visits  Medication Dose Route Frequency Provider Last Rate Last Dose  . PACLitaxel (TAXOL) 168 mg in dextrose 5 % 250 mL chemo infusion (</= 68m/m2)  80 mg/m2 (Treatment Plan Recorded) Intravenous Once FLloyd Huger MD        OBJECTIVE: Vitals:   04/17/17 0848  BP: 120/74  Pulse: 75  Resp: 18  Temp: (!) 97.4 F (36.3 C)     Body mass index is 38.24 kg/m.    ECOG FS:0 - Asymptomatic  General: Well-developed, well-nourished, no acute distress. Eyes: anicteric sclera. Breasts: Patient has bilateral mastectomies with reconstruction, no obvious evidence of local recurrence. Lungs: Clear to auscultation bilaterally. Port in place in the right chest wall. Heart: Regular rate and rhythm. No rubs, murmurs, or gallops. Abdomen: Soft, nontender, nondistended. No organomegaly noted, normoactive bowel sounds. Musculoskeletal: No edema, cyanosis, or clubbing. Neuro: Alert, answering all questions appropriately. Cranial nerves grossly intact. Skin: No rashes or petechiae noted. Psych: Normal affect.   LAB RESULTS:  Lab Results  Component Value Date   NA 138 04/17/2017   K 3.5 04/17/2017   CL 111 04/17/2017   CO2 21 (L) 04/17/2017   GLUCOSE 132 (H) 04/17/2017   BUN 14 04/17/2017   CREATININE 0.89 04/17/2017   CALCIUM 9.3 04/17/2017   PROT 7.2 04/17/2017   ALBUMIN 4.0 04/17/2017   AST 36 04/17/2017   ALT 32 04/17/2017   ALKPHOS 64 04/17/2017   BILITOT 0.5 04/17/2017   GFRNONAA >60 04/17/2017   GFRAA >60 04/17/2017    Lab Results  Component Value Date   WBC 5.4 04/17/2017   NEUTROABS 3.5 04/17/2017   HGB 13.9 04/17/2017   HCT 40.1 04/17/2017   MCV 93.0 04/17/2017   PLT 244 04/17/2017     STUDIES: Ct Angio Chest Pe W Or Wo Contrast  Result  Date: 03/18/2017 CLINICAL DATA:  Tachycardia and dyspnea. EXAM: CT ANGIOGRAPHY CHEST WITH CONTRAST TECHNIQUE: Multidetector CT imaging of the chest was performed using the standard protocol during bolus administration of intravenous contrast. Multiplanar CT image reconstructions and MIPs were obtained to evaluate the vascular anatomy. CONTRAST:  75 cc Isovue 370 IV COMPARISON:  Same day CXR FINDINGS: Cardiovascular: Large pericardial effusion, anteriorly measuring up to 1.9 cm in thickness, 2.9 cm in thickness at the cardiac apex and posteriorly at 2.2 cm in thickness. The heart is otherwise normal in size. There is good opacification of the pulmonary arteries. No pulmonary embolus is identified. No aortic aneurysm or dissection. Minimal aortic atherosclerosis. Mediastinum/Nodes: No enlarged mediastinal, hilar, or axillary lymph nodes. Thyroid gland, trachea, and esophagus demonstrate no significant findings. Lungs/Pleura: Bilateral small to moderate pleural effusions with adjacent atelectasis. No dominant mass. Faint bilateral ground-glass opacities may represent stigmata of pulmonary edema or hypoventilatory change among some possibilities  though not exclusive. Upper Abdomen: Bilateral saline subglandular implants with mild bilateral skin thickening and soft tissue induration about the left breast. Clinical correlation recommended. Musculoskeletal: Dextroscoliosis of the thoracic spine without acute nor suspicious osseous abnormality. Review of the MIP images confirms the above findings. IMPRESSION: 1. Large pericardial effusion.  Cardiology assessment recommended. 2. Small bilateral pleural effusions with atelectasis and ground-glass opacities likely representing stigmata of CHF. 3. No acute pulmonary embolus. 4. Bilateral skin thickening and subcutaneous fatty induration of the left breast with underlying bilateral saline subglandular implants. Clinical correlation recommended. Aortic Atherosclerosis  (ICD10-I70.0). Electronically Signed   By: Ashley Royalty M.D.   On: 03/18/2017 20:09   Mr Jeri Cos XE Contrast  Result Date: 04/03/2017 CLINICAL DATA:  Breast cancer, here for restaging. Assess for intracranial metastasis. EXAM: MRI HEAD WITHOUT AND WITH CONTRAST TECHNIQUE: Multiplanar, multiecho pulse sequences of the brain and surrounding structures were obtained without and with intravenous contrast. CONTRAST:  20 cc MultiHance COMPARISON:  None available for comparison at time of study interpretation. CT HEAD report reviewed though images are not available for direct comparison. FINDINGS: INTRACRANIAL CONTENTS: Four supratentorial parenchymal cystic metastasis with peripheral reduced diffusion consistent with hypercellular tumor as follows: 12 mm LEFT putaminal, 8 mm LEFT internal capsule posterior limb, 8 mm splenium of corpus callosum, 14 mm LEFT parietal lobe.Minimal surrounding FLAIR T2 hyperintense vasogenic edema. In addition, 14 mm solidly enhancing, low T2 RIGHT frontal dural based mass with dural tail and susceptibility artifact most compatible with meningioma. No midline shift or mass effect. Ventricles and sulci are normal for patient's age. No abnormal extra-axial fluid collection. VASCULAR: Normal major intracranial vascular flow voids present at skull base. SKULL AND UPPER CERVICAL SPINE: No abnormal sellar expansion. No suspicious calvarial bone marrow signal. Craniocervical junction maintained. SINUSES/ORBITS: The mastoid air-cells and included paranasal sinuses are well-aerated.The included ocular globes and orbital contents are non-suspicious. OTHER: None. IMPRESSION: 1. Four supratentorial parenchymal metastasis, largest in LEFT parietal lobe measuring 14 mm. No significant mass effect. 2. 14 mm RIGHT frontal meningioma. 3. Otherwise negative MRI of the head with and without contrast. These results will be called to the ordering clinician or representative by the Radiologist Assistant, and  communication documented in the PACS or zVision Dashboard. Electronically Signed   By: Elon Alas M.D.   On: 04/03/2017 14:21   Nm Myocar Multi W/spect W/wall Motion / Ef  Result Date: 04/01/2017  T wave inversion was noted during stress in the I, II, III, aVF, V2, V3, V4, V5 and V6 leads.  There was no ST segment deviation noted during stress.  The study is normal.  This is a low risk study.  The left ventricular ejection fraction is normal (55-65%).    Nm Pet Image Initial (pi) Skull Base To Thigh  Result Date: 04/03/2017 CLINICAL DATA:  Initial treatment strategy for left breast cancer. EXAM: NUCLEAR MEDICINE PET SKULL BASE TO THIGH TECHNIQUE: 12.5 mCi F-18 FDG was injected intravenously. Full-ring PET imaging was performed from the skull base to thigh after the radiotracer. CT data was obtained and used for attenuation correction and anatomic localization. FASTING BLOOD GLUCOSE:  Value: 95 mg/dl COMPARISON:  Chest CT 03/18/2017 FINDINGS: NECK: Diffuse increased uptake in the thyroid gland likely due to inflammations/thyroiditis. 5 x 8 mm left-sided supraclavicular node on image number 57 is metabolically active with SUV max of 3.6. No neck mass or neck nodes. CHEST: A discrete left breast mass is not identified. There is mild skin thickening and  subcutaneous interstitial changes which are mildly hypermetabolic. Multiple enlarged hypermetabolic left axillary lymph nodes are noted. The largest measures 23 x 13.5 mm on image number 69. SUV max is 12.9. 9 mm node on image number 79 has SUV max of 10.5. 5 mm prevascular lymph node better seen on prior chest CT on image number 19 has an SUV max of 3.6. The 4 mm right middle lobe pulmonary nodule on image number 107 is indeterminate. There also small pulmonary nodules noted in the right upper lobe on image number 92 and near the right major fissure on image number 96. Small pulmonary metastasis are possible. Attention on future scans is suggested.  No areas of hypermetabolism. ABDOMEN/PELVIS: The lateral limb of the right adrenal gland is thickened and is hypermetabolic with SUV max of 3.55. Several small but metabolically active retroperitoneal lymph nodes are noted. 6 mm node between the aorta and IVC on image number 148 has an SUV max of 5.6. Smaller noted anterior to the IVC near the pancreatic head has SUV max of 4.3. Slightly lower interaortocaval lymph node on image number 152 measures 7 mm and has SUV max of 6.0. Small left-sided retroperitoneal lymph nodes are also hypermetabolic. The 7 mm node on image number 163 has an SUV max of 6.5. Incidental findings include multiple left-sided renal calculi and a left ovarian cyst. SKELETON: No findings for osseous metastatic disease. IMPRESSION: 1. A discrete left breast mass is not identified but there are enlarged and hypermetabolic left axillary lymph nodes consistent with metastatic adenopathy. 2. Small left supraclavicular lymph node is hypermetabolic and worrisome for metastatic adenopathy. There is also a metabolically active small prevascular lymph node and small pulmonary nodules suspicious for metastasis. 3. Numerous small retroperitoneal lymph nodes are hypermetabolic and concerning for metastatic disease. 4. Slightly thickened right adrenal gland without discrete mass is hypermetabolic and suspicious for metastasis. 5. No findings to suggest hepatic or osseous metastatic disease. Electronically Signed   By: Marijo Sanes M.D.   On: 04/03/2017 13:59   Dg Chest Port 1 View  Result Date: 04/09/2017 CLINICAL DATA:  Status post Port-A-Cath insertion. EXAM: PORTABLE CHEST 1 VIEW COMPARISON:  03/21/2017 FINDINGS: New right anterior chest wall Port-A-Cath. The catheter extends to the neck base through the internal jugular vein. Tip projects at the caval atrial junction. No pneumothorax. Clear lungs. Cardiac silhouette is normal in size. No mediastinal or hilar masses or evidence of adenopathy.  IMPRESSION: 1. Right anterior chest wall Port-A-Cath catheter tip projects at the caval atrial junction, well positioned. 2. No pneumothorax. 3. No acute cardiopulmonary disease. Electronically Signed   By: Lajean Manes M.D.   On: 04/09/2017 17:30   Dg Chest Port 1 View  Result Date: 03/21/2017 CLINICAL DATA:  61 year old female with shortness of Breath. Pericardiocentesis 2 days ago. Pericardial and pleural effusions. EXAM: PORTABLE CHEST 1 VIEW COMPARISON:  03/19/2017 and earlier. FINDINGS: Portable AP upright view at 0808 hours. Bilateral breast implants incidentally noted. Continued veiling opacity at the left lung base and indistinct appearance of the lower heart border. A drainage catheter continues to project over the cardiac silhouette and appears stable. Small right pleural effusion appears stable. Decreased pulmonary vascularity. No pneumothorax. Other mediastinal contours are stable. Visualized tracheal air column is within normal limits. IMPRESSION: 1. Stable pericardial catheter and cardiac size since 03/19/2017. 2. Decreased pulmonary vascularity, no acute edema. 3. Continued confluent opacity at the left lung base, probably a combination of pleural effusion and atelectasis or consolidation. 4. No new  cardiopulmonary abnormality. Electronically Signed   By: Genevie Ann M.D.   On: 03/21/2017 08:48   Dg Chest Port 1 View  Result Date: 03/19/2017 CLINICAL DATA:  Chest pain EXAM: PORTABLE CHEST 1 VIEW COMPARISON:  03/18/2017 chest radiograph. FINDINGS: Pericardial drain terminates over the right heart. Mildly enlarged cardiopericardial silhouette, slightly decreased in size. Otherwise stable mediastinal contour. No pneumothorax. Stable trace right and small left pleural effusions. No overt pulmonary edema. Patchy bibasilar lung opacities, left greater than right, stable. IMPRESSION: 1. No pneumothorax. 2. Stable small left and trace right pleural effusions and left greater than right bibasilar lung  opacities. 3. Pericardial drain terminates over the right heart. Mildly enlarged cardiopericardial silhouette, slightly decreased. Electronically Signed   By: Ilona Sorrel M.D.   On: 03/19/2017 13:18   Dg Chest Port 1 View  Result Date: 03/18/2017 CLINICAL DATA:  Tachycardia and pain EXAM: PORTABLE CHEST 1 VIEW COMPARISON:  None. FINDINGS: Cardiac shadow is enlarged. Increased density is noted over the left base in part related to overlying soft tissue although some effusion and infiltrate is likely present as well. No acute bony abnormality is seen. IMPRESSION: Increased density over the left base likely representing a combination of effusion and infiltrate. Two-view chest is recommended when the patient's condition allows. Electronically Signed   By: Inez Catalina M.D.   On: 03/18/2017 15:07   Dg C-arm 1-60 Min-no Report  Result Date: 04/09/2017 Fluoroscopy was utilized by the requesting physician.  No radiographic interpretation.    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    PLAN:    1. 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 Patient to have extensive lymphatic disease as well as brain metastasis. Biopsy of axillary lymph node confirms likely breast cancer origin, but has returned ER/PR negative, HER-2 2+ with reflex to [place] pending. Patient's CA-27-29 is now mildly elevated at 65.6.  Foundation 1 testing is pending at time of dictation. Patient will initiate XRT to her brain on Monday.  After lengthy discussion, she wishes to pursue palliative chemotherapy and will initially single agent Taxol on days 1, 8, 15 with day 22 off. Plan to do 3 months of treatment and then reimage in November 2018. Will add in Herceptin later if Sunburst returns positive. Proceed with cycle 1, day 1. Return to clinic in 1 week for consideration of cycle 1, day 8. 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 2. Brain metastasis: Palliative XRT begins next week.  3. 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 Continue follow-up with cardiology as indicated.  Patient expressed understanding and was in agreement with this plan. She also understands that She can call clinic at any time with any questions, concerns, or complaints.   Approximately 30 minutes was spent in discussion of which greater than 50% was consultation.   Breast cancer   Staging form: Breast, AJCC 7th Edition     Clinical stage from 02/14/2015: Stage IA (T1c, N0, M0) - Signed by Lloyd Huger, MD on 02/14/2015   Lloyd Huger, MD  04/17/2017 10:23 AM   Addendum: Patient's axillary node biopsy revealed HER-2 positive disease consistent with her original breast cancer. Patient will require MUGA scan and then will add Herceptin weekly with the start of cycle 2.   Lloyd Huger, MD 04/22/17 12:57 PM

## 2017-04-12 NOTE — Telephone Encounter (Signed)
Thank you :)

## 2017-04-16 DIAGNOSIS — C50919 Malignant neoplasm of unspecified site of unspecified female breast: Secondary | ICD-10-CM | POA: Diagnosis not present

## 2017-04-16 DIAGNOSIS — C773 Secondary and unspecified malignant neoplasm of axilla and upper limb lymph nodes: Secondary | ICD-10-CM | POA: Diagnosis not present

## 2017-04-17 ENCOUNTER — Inpatient Hospital Stay: Payer: 59

## 2017-04-17 ENCOUNTER — Ambulatory Visit
Admission: RE | Admit: 2017-04-17 | Discharge: 2017-04-17 | Disposition: A | Discharge status: Home / Self Care | Payer: 59 | Source: Ambulatory Visit | Attending: Radiation Oncology | Admitting: Radiation Oncology

## 2017-04-17 ENCOUNTER — Inpatient Hospital Stay: Payer: 59 | Attending: Oncology

## 2017-04-17 ENCOUNTER — Inpatient Hospital Stay (HOSPITAL_BASED_OUTPATIENT_CLINIC_OR_DEPARTMENT_OTHER): Payer: 59 | Admitting: Oncology

## 2017-04-17 VITALS — BP 110/73 | HR 73 | Temp 96.8°F | Resp 18

## 2017-04-17 VITALS — BP 120/74 | HR 75 | Temp 97.4°F | Resp 18 | Wt 215.9 lb

## 2017-04-17 DIAGNOSIS — N83202 Unspecified ovarian cyst, left side: Secondary | ICD-10-CM

## 2017-04-17 DIAGNOSIS — Z801 Family history of malignant neoplasm of trachea, bronchus and lung: Secondary | ICD-10-CM | POA: Insufficient documentation

## 2017-04-17 DIAGNOSIS — Z170 Estrogen receptor positive status [ER+]: Secondary | ICD-10-CM | POA: Diagnosis not present

## 2017-04-17 DIAGNOSIS — Z79899 Other long term (current) drug therapy: Secondary | ICD-10-CM

## 2017-04-17 DIAGNOSIS — I700 Atherosclerosis of aorta: Secondary | ICD-10-CM | POA: Insufficient documentation

## 2017-04-17 DIAGNOSIS — Z9013 Acquired absence of bilateral breasts and nipples: Secondary | ICD-10-CM | POA: Insufficient documentation

## 2017-04-17 DIAGNOSIS — J90 Pleural effusion, not elsewhere classified: Secondary | ICD-10-CM | POA: Diagnosis not present

## 2017-04-17 DIAGNOSIS — Z171 Estrogen receptor negative status [ER-]: Secondary | ICD-10-CM | POA: Diagnosis not present

## 2017-04-17 DIAGNOSIS — N200 Calculus of kidney: Secondary | ICD-10-CM | POA: Diagnosis not present

## 2017-04-17 DIAGNOSIS — Z8669 Personal history of other diseases of the nervous system and sense organs: Secondary | ICD-10-CM

## 2017-04-17 DIAGNOSIS — C7931 Secondary malignant neoplasm of brain: Secondary | ICD-10-CM | POA: Diagnosis not present

## 2017-04-17 DIAGNOSIS — F329 Major depressive disorder, single episode, unspecified: Secondary | ICD-10-CM | POA: Insufficient documentation

## 2017-04-17 DIAGNOSIS — Z800 Family history of malignant neoplasm of digestive organs: Secondary | ICD-10-CM | POA: Insufficient documentation

## 2017-04-17 DIAGNOSIS — C50411 Malignant neoplasm of upper-outer quadrant of right female breast: Secondary | ICD-10-CM

## 2017-04-17 DIAGNOSIS — J45909 Unspecified asthma, uncomplicated: Secondary | ICD-10-CM | POA: Diagnosis not present

## 2017-04-17 DIAGNOSIS — I313 Pericardial effusion (noninflammatory): Secondary | ICD-10-CM

## 2017-04-17 DIAGNOSIS — R000 Tachycardia, unspecified: Secondary | ICD-10-CM

## 2017-04-17 DIAGNOSIS — D320 Benign neoplasm of cerebral meninges: Secondary | ICD-10-CM

## 2017-04-17 DIAGNOSIS — E039 Hypothyroidism, unspecified: Secondary | ICD-10-CM

## 2017-04-17 DIAGNOSIS — Z5111 Encounter for antineoplastic chemotherapy: Secondary | ICD-10-CM | POA: Diagnosis not present

## 2017-04-17 DIAGNOSIS — R590 Localized enlarged lymph nodes: Secondary | ICD-10-CM

## 2017-04-17 DIAGNOSIS — Z7189 Other specified counseling: Secondary | ICD-10-CM

## 2017-04-17 DIAGNOSIS — Z510 Encounter for antineoplastic radiation therapy: Secondary | ICD-10-CM | POA: Diagnosis not present

## 2017-04-17 LAB — COMPREHENSIVE METABOLIC PANEL
ALK PHOS: 64 U/L (ref 38–126)
ALT: 32 U/L (ref 14–54)
ANION GAP: 6 (ref 5–15)
AST: 36 U/L (ref 15–41)
Albumin: 4 g/dL (ref 3.5–5.0)
BILIRUBIN TOTAL: 0.5 mg/dL (ref 0.3–1.2)
BUN: 14 mg/dL (ref 6–20)
CALCIUM: 9.3 mg/dL (ref 8.9–10.3)
CO2: 21 mmol/L — AB (ref 22–32)
Chloride: 111 mmol/L (ref 101–111)
Creatinine, Ser: 0.89 mg/dL (ref 0.44–1.00)
GFR calc Af Amer: >60 mL/min (ref >60)
GFR calc non Af Amer: >60 mL/min (ref >60)
Glucose, Bld: 132 mg/dL — ABNORMAL HIGH (ref 65–99)
Potassium: 3.5 mmol/L (ref 3.5–5.1)
SODIUM: 138 mmol/L (ref 135–145)
TOTAL PROTEIN: 7.2 g/dL (ref 6.5–8.1)

## 2017-04-17 LAB — CBC WITH DIFFERENTIAL/PLATELET
Basophils Absolute: 0 10*3/uL (ref 0–0.1)
Basophils Relative: 1 %
EOS ABS: 0.1 10*3/uL (ref 0–0.7)
Eosinophils Relative: 3 %
HCT: 40.1 % (ref 35.0–47.0)
HEMOGLOBIN: 13.9 g/dL (ref 12.0–16.0)
LYMPHS ABS: 1.4 10*3/uL (ref 1.0–3.6)
Lymphocytes Relative: 26 %
MCH: 32.3 pg (ref 26.0–34.0)
MCHC: 34.8 g/dL (ref 32.0–36.0)
MCV: 93 fL (ref 80.0–100.0)
MONO ABS: 0.4 10*3/uL (ref 0.2–0.9)
MONOS PCT: 7 %
NEUTROS PCT: 63 %
Neutro Abs: 3.5 10*3/uL (ref 1.4–6.5)
Platelets: 244 10*3/uL (ref 150–440)
RBC: 4.31 MIL/uL (ref 3.80–5.20)
RDW: 13.5 % (ref 11.5–14.5)
WBC: 5.4 10*3/uL (ref 3.6–11.0)

## 2017-04-17 MED ORDER — HEPARIN SOD (PORK) LOCK FLUSH 100 UNIT/ML IV SOLN
500.0000 [IU] | Freq: Once | INTRAVENOUS | Status: AC
  Administered 2017-04-17: 500 [IU] via INTRAVENOUS

## 2017-04-17 MED ORDER — SODIUM CHLORIDE 0.9 % IV SOLN
Freq: Once | INTRAVENOUS | Status: AC
  Administered 2017-04-17: 10:00:00 via INTRAVENOUS
  Filled 2017-04-17: qty 1000

## 2017-04-17 MED ORDER — DIPHENHYDRAMINE HCL 50 MG/ML IJ SOLN
25.0000 mg | Freq: Once | INTRAMUSCULAR | Status: AC
  Administered 2017-04-17: 25 mg via INTRAVENOUS
  Filled 2017-04-17: qty 1

## 2017-04-17 MED ORDER — FAMOTIDINE IN NACL 20-0.9 MG/50ML-% IV SOLN
20.0000 mg | Freq: Once | INTRAVENOUS | Status: AC
  Administered 2017-04-17: 20 mg via INTRAVENOUS
  Filled 2017-04-17: qty 50

## 2017-04-17 MED ORDER — DEXAMETHASONE SODIUM PHOSPHATE 10 MG/ML IJ SOLN
10.0000 mg | Freq: Once | INTRAMUSCULAR | Status: AC
  Administered 2017-04-17: 10 mg via INTRAVENOUS
  Filled 2017-04-17: qty 1

## 2017-04-17 MED ORDER — PACLITAXEL CHEMO INJECTION 300 MG/50ML
80.0000 mg/m2 | Freq: Once | INTRAVENOUS | Status: AC
  Administered 2017-04-17: 168 mg via INTRAVENOUS
  Filled 2017-04-17: qty 28

## 2017-04-17 MED ORDER — SODIUM CHLORIDE 0.9 % IV SOLN: 10.0000 mg | Freq: Once | INTRAVENOUS | Status: DC

## 2017-04-17 NOTE — Progress Notes (Signed)
Patient is here for follow up, she is doing well no major complaints  

## 2017-04-18 DIAGNOSIS — C7931 Secondary malignant neoplasm of brain: Secondary | ICD-10-CM | POA: Diagnosis not present

## 2017-04-18 DIAGNOSIS — C50411 Malignant neoplasm of upper-outer quadrant of right female breast: Secondary | ICD-10-CM | POA: Diagnosis not present

## 2017-04-18 DIAGNOSIS — Z510 Encounter for antineoplastic radiation therapy: Secondary | ICD-10-CM | POA: Diagnosis not present

## 2017-04-18 DIAGNOSIS — Z170 Estrogen receptor positive status [ER+]: Secondary | ICD-10-CM | POA: Diagnosis not present

## 2017-04-18 NOTE — Progress Notes (Addendum)
  Oncology Nurse Navigator Documentation  Navigator Location: [facility] (04/18/17 1700)   )Navigator Encounter Type: Telephone (04/18/17 1700) Telephone: Outgoing Call (04/18/17 1700)                   Patient Visit Type: Follow-up (04/18/17 1700) Treatment Phase: First Chemo Tx (04/18/17 1700)                            Time Spent with Patient: 30 (04/18/17 1700)   Met with patient yesterday during chemotherapy infusion. Patient doing well day after first Taxol.  Had simulation for radiation today.  Support given. Submitted Mortgage payment to [employer].

## 2017-04-19 ENCOUNTER — Other Ambulatory Visit: Payer: Self-pay | Admitting: Oncology

## 2017-04-19 ENCOUNTER — Other Ambulatory Visit: Payer: Self-pay | Admitting: *Deleted

## 2017-04-22 ENCOUNTER — Encounter: Payer: Self-pay | Admitting: Oncology

## 2017-04-22 ENCOUNTER — Other Ambulatory Visit: Payer: Self-pay | Admitting: Oncology

## 2017-04-22 ENCOUNTER — Other Ambulatory Visit: Payer: Self-pay | Admitting: *Deleted

## 2017-04-22 ENCOUNTER — Ambulatory Visit
Admission: RE | Admit: 2017-04-22 | Discharge: 2017-04-22 | Disposition: A | Discharge status: Home / Self Care | Payer: 59 | Source: Ambulatory Visit | Attending: Radiation Oncology | Admitting: Radiation Oncology

## 2017-04-22 DIAGNOSIS — C50411 Malignant neoplasm of upper-outer quadrant of right female breast: Secondary | ICD-10-CM | POA: Diagnosis not present

## 2017-04-22 DIAGNOSIS — C7931 Secondary malignant neoplasm of brain: Secondary | ICD-10-CM | POA: Diagnosis not present

## 2017-04-22 DIAGNOSIS — C50912 Malignant neoplasm of unspecified site of left female breast: Secondary | ICD-10-CM | POA: Diagnosis not present

## 2017-04-22 DIAGNOSIS — Z170 Estrogen receptor positive status [ER+]: Secondary | ICD-10-CM | POA: Diagnosis not present

## 2017-04-22 DIAGNOSIS — Z510 Encounter for antineoplastic radiation therapy: Secondary | ICD-10-CM | POA: Diagnosis not present

## 2017-04-22 MED ORDER — DEXAMETHASONE 4 MG PO TABS: 4.0000 mg | ORAL_TABLET | Freq: Two times a day (BID) | ORAL | 0 refills | Status: DC

## 2017-04-23 ENCOUNTER — Ambulatory Visit
Admission: RE | Admit: 2017-04-23 | Discharge: 2017-04-23 | Disposition: A | Discharge status: Home / Self Care | Payer: 59 | Source: Ambulatory Visit | Attending: Radiation Oncology | Admitting: Radiation Oncology

## 2017-04-23 DIAGNOSIS — Z510 Encounter for antineoplastic radiation therapy: Secondary | ICD-10-CM | POA: Diagnosis not present

## 2017-04-23 DIAGNOSIS — C50919 Malignant neoplasm of unspecified site of unspecified female breast: Secondary | ICD-10-CM | POA: Diagnosis not present

## 2017-04-23 DIAGNOSIS — I313 Pericardial effusion (noninflammatory): Secondary | ICD-10-CM | POA: Diagnosis not present

## 2017-04-23 DIAGNOSIS — E034 Atrophy of thyroid (acquired): Secondary | ICD-10-CM | POA: Diagnosis not present

## 2017-04-23 NOTE — Progress Notes (Addendum)
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   INTERVAL HISTORY: Patient returns to clinic today for Day 8, cycle 1 of weekly taxol. She continues to be highly anxious, but otherwise feels well. She began having a headache yesterday during her first brain radiation. The headache is better today. She has no further shortness of breath. She has a good appetite and has maintained her weight.  She has no chest pain, cough, or hemoptysis.  She has no nausea, vomiting, constipation, or diarrhea.  Patient offers no specific complaints today.  REVIEW OF SYSTEMS:   Review of Systems  Constitutional: Negative.  Negative for fever, malaise/fatigue and weight loss.  Respiratory: Negative.  Negative for cough and shortness of breath.   Cardiovascular: Negative.  Negative for chest pain and leg swelling.  Gastrointestinal: Negative.  Negative for abdominal pain.  Genitourinary: Negative.   Musculoskeletal: Negative.   Skin: Negative.  Negative for rash.  Neurological: Positive for headaches. Negative for sensory change and weakness.  Psychiatric/Behavioral: The patient is nervous/anxious.    As per HPI. Otherwise, a complete review of systems is negative.   PAST MEDICAL HISTORY:  Hypothyroidism, migraines, depression, asthma.  PAST SURGICAL HISTORY: Bilateral mastectomy with reconstruction, partial hysterectomy.  FAMILY HISTORY: Lung cancer, melanoma, stomach cancer.  Also diabetes, CAD, hypertension     ADVANCED DIRECTIVES:    HEALTH MAINTENANCE: Social  History  Substance Use Topics  . Smoking status: Never Smoker  . Smokeless tobacco: Never Used  . Alcohol use No     Colonoscopy:  PAP:  Bone density:  Lipid panel:  Allergies  Allergen Reactions  . Codeine Other (See Comments)    constipation  . Sulfa Antibiotics Other (See Comments)    Allergy as a child    Current Outpatient Prescriptions  Medication Sig Dispense Refill  . alendronate (FOSAMAX) 70 MG tablet Take 70 mg by mouth once a week. Take with a full glass of water on an empty stomach.     . ALPRAZolam (XANAX) 0.25 MG tablet Take 1 tablet by mouth 3 (three) times daily as needed for anxiety.     Marland Kitchen azelastine (ASTELIN) 0.1 % nasal spray Place 1 spray into both nostrils daily as needed for rhinitis. Use in each nostril as directed    . calcium-vitamin D (OSCAL WITH D) 250-125 MG-UNIT tablet Take 1 tablet by mouth daily.    . Cholecalciferol (D3 HIGH POTENCY) 2000 units CAPS Take 1 capsule by mouth daily.    . cyclobenzaprine (FLEXERIL) 10 MG tablet Take 10 mg by mouth 3 (three) times daily as needed.     Marland Kitchen dexamethasone (DECADRON) 4 MG tablet Take 1 tablet (4 mg total) by mouth 2 (two) times daily with a meal. 20 tablet 0  . DULoxetine (CYMBALTA) 30 MG capsule Take 90 mg by mouth every morning.     . fluticasone (FLOVENT HFA) 110 MCG/ACT inhaler Inhale 1 puff into the lungs 2 (two) times daily.    Marland Kitchen levothyroxine (SYNTHROID, LEVOTHROID) 137 MCG tablet Take 137 mcg by mouth daily before breakfast.     . lidocaine-prilocaine (EMLA) cream Apply  to affected area once 30 g 3  . metoprolol succinate (TOPROL-XL) 25 MG 24 hr tablet Take 75 mg by mouth at bedtime.     . Multiple Vitamin (MULTIVITAMIN) tablet Take 1 tablet by mouth daily.    . ondansetron (ZOFRAN) 8 MG tablet Take 1 tablet (8 mg total) by mouth 2 (two) times daily as needed (Nausea or vomiting). 60 tablet 3  . prochlorperazine (COMPAZINE) 10 MG tablet Take 1 tablet (10 mg total) by mouth every 6 (six) hours as needed  (Nausea or vomiting). 60 tablet 3  . topiramate (TOPAMAX) 50 MG tablet Take 50 mg by mouth at bedtime.     . traMADol (ULTRAM) 50 MG tablet Take 25-50 mg by mouth every 6 (six) hours as needed.      No current facility-administered medications for this visit.     OBJECTIVE: Vitals:   04/24/17 0850  BP: 112/74  Pulse: 81  Resp: 18  Temp: 98 F (36.7 C)  SpO2: 98%     Body mass index is 38.32 kg/m.    ECOG FS:0 - Asymptomatic  General: Well-developed, well-nourished, no acute distress. Eyes: anicteric sclera. Breasts: Patient has bilateral mastectomies with reconstruction, no obvious evidence of local recurrence. Lungs: Clear to auscultation bilaterally. Port in place in the right chest wall. Heart: Regular rate and rhythm. No rubs, murmurs, or gallops. Abdomen: Soft, nontender, nondistended. No organomegaly noted, normoactive bowel sounds. Musculoskeletal: No edema, cyanosis, or clubbing. Neuro: Alert, answering all questions appropriately. Cranial nerves grossly intact. Skin: No rashes or petechiae noted. Psych: Normal affect.   LAB RESULTS:  Lab Results  Component Value Date   NA 138 04/17/2017   K 3.5 04/17/2017   CL 111 04/17/2017   CO2 21 (L) 04/17/2017   GLUCOSE 132 (H) 04/17/2017   BUN 14 04/17/2017   CREATININE 0.89 04/17/2017   CALCIUM 9.3 04/17/2017   PROT 7.2 04/17/2017   ALBUMIN 4.0 04/17/2017   AST 36 04/17/2017   ALT 32 04/17/2017   ALKPHOS 64 04/17/2017   BILITOT 0.5 04/17/2017   GFRNONAA >60 04/17/2017   GFRAA >60 04/17/2017    Lab Results  Component Value Date   WBC 5.4 04/17/2017   NEUTROABS 3.5 04/17/2017   HGB 13.9 04/17/2017   HCT 40.1 04/17/2017   MCV 93.0 04/17/2017   PLT 244 04/17/2017     STUDIES: Mr Julie Irwin AL Contrast  Result Date: 04/03/2017 CLINICAL DATA:  Breast cancer, here for restaging. Assess for intracranial metastasis. EXAM: MRI HEAD WITHOUT AND WITH CONTRAST TECHNIQUE: Multiplanar, multiecho pulse sequences of the  brain and surrounding structures were obtained without and with intravenous contrast. CONTRAST:  20 cc MultiHance COMPARISON:  None available for comparison at time of study interpretation. CT HEAD report reviewed though images are not available for direct comparison. FINDINGS: INTRACRANIAL CONTENTS: Four supratentorial parenchymal cystic metastasis with peripheral reduced diffusion consistent with hypercellular tumor as follows: 12 mm LEFT putaminal, 8 mm LEFT internal capsule posterior limb, 8 mm splenium of corpus callosum, 14 mm LEFT parietal lobe.Minimal surrounding FLAIR T2 hyperintense vasogenic edema. In addition, 14 mm solidly enhancing, low T2 RIGHT frontal dural based mass with dural tail and susceptibility artifact most compatible with meningioma. No midline shift or mass effect. Ventricles and sulci are normal for patient's age. No abnormal extra-axial fluid collection. VASCULAR: Normal major intracranial vascular flow voids present at skull base. SKULL AND UPPER CERVICAL SPINE: No abnormal sellar expansion. No suspicious calvarial bone marrow signal. Craniocervical junction maintained. SINUSES/ORBITS: The  mastoid air-cells and included paranasal sinuses are well-aerated.The included ocular globes and orbital contents are non-suspicious. OTHER: None. IMPRESSION: 1. Four supratentorial parenchymal metastasis, largest in LEFT parietal lobe measuring 14 mm. No significant mass effect. 2. 14 mm RIGHT frontal meningioma. 3. Otherwise negative MRI of the head with and without contrast. These results will be called to the ordering clinician or representative by the Radiologist Assistant, and communication documented in the PACS or zVision Dashboard. Electronically Signed   By: Elon Alas M.D.   On: 04/03/2017 14:21   Nm Myocar Multi W/spect W/wall Motion / Ef  Result Date: 04/01/2017  T wave inversion was noted during stress in the I, II, III, aVF, V2, V3, V4, V5 and V6 leads.  There was no ST  segment deviation noted during stress.  The study is normal.  This is a low risk study.  The left ventricular ejection fraction is normal (55-65%).    Nm Pet Image Initial (pi) Skull Base To Thigh  Result Date: 04/03/2017 CLINICAL DATA:  Initial treatment strategy for left breast cancer. EXAM: NUCLEAR MEDICINE PET SKULL BASE TO THIGH TECHNIQUE: 12.5 mCi F-18 FDG was injected intravenously. Full-ring PET imaging was performed from the skull base to thigh after the radiotracer. CT data was obtained and used for attenuation correction and anatomic localization. FASTING BLOOD GLUCOSE:  Value: 95 mg/dl COMPARISON:  Chest CT 03/18/2017 FINDINGS: NECK: Diffuse increased uptake in the thyroid gland likely due to inflammations/thyroiditis. 5 x 8 mm left-sided supraclavicular node on image number 57 is metabolically active with SUV max of 3.6. No neck mass or neck nodes. CHEST: A discrete left breast mass is not identified. There is mild skin thickening and subcutaneous interstitial changes which are mildly hypermetabolic. Multiple enlarged hypermetabolic left axillary lymph nodes are noted. The largest measures 23 x 13.5 mm on image number 69. SUV max is 12.9. 9 mm node on image number 79 has SUV max of 10.5. 5 mm prevascular lymph node better seen on prior chest CT on image number 19 has an SUV max of 3.6. The 4 mm right middle lobe pulmonary nodule on image number 107 is indeterminate. There also small pulmonary nodules noted in the right upper lobe on image number 92 and near the right major fissure on image number 96. Small pulmonary metastasis are possible. Attention on future scans is suggested. No areas of hypermetabolism. ABDOMEN/PELVIS: The lateral limb of the right adrenal gland is thickened and is hypermetabolic with SUV max of 1.15. Several small but metabolically active retroperitoneal lymph nodes are noted. 6 mm node between the aorta and IVC on image number 148 has an SUV max of 5.6. Smaller noted  anterior to the IVC near the pancreatic head has SUV max of 4.3. Slightly lower interaortocaval lymph node on image number 152 measures 7 mm and has SUV max of 6.0. Small left-sided retroperitoneal lymph nodes are also hypermetabolic. The 7 mm node on image number 163 has an SUV max of 6.5. Incidental findings include multiple left-sided renal calculi and a left ovarian cyst. SKELETON: No findings for osseous metastatic disease. IMPRESSION: 1. A discrete left breast mass is not identified but there are enlarged and hypermetabolic left axillary lymph nodes consistent with metastatic adenopathy. 2. Small left supraclavicular lymph node is hypermetabolic and worrisome for metastatic adenopathy. There is also a metabolically active small prevascular lymph node and small pulmonary nodules suspicious for metastasis. 3. Numerous small retroperitoneal lymph nodes are hypermetabolic and concerning for metastatic disease. 4. Slightly  thickened right adrenal gland without discrete mass is hypermetabolic and suspicious for metastasis. 5. No findings to suggest hepatic or osseous metastatic disease. Electronically Signed   By: Marijo Sanes M.D.   On: 04/03/2017 13:59   Dg Chest Port 1 View  Result Date: 04/09/2017 CLINICAL DATA:  Status post Port-A-Cath insertion. EXAM: PORTABLE CHEST 1 VIEW COMPARISON:  03/21/2017 FINDINGS: New right anterior chest wall Port-A-Cath. The catheter extends to the neck base through the internal jugular vein. Tip projects at the caval atrial junction. No pneumothorax. Clear lungs. Cardiac silhouette is normal in size. No mediastinal or hilar masses or evidence of adenopathy. IMPRESSION: 1. Right anterior chest wall Port-A-Cath catheter tip projects at the caval atrial junction, well positioned. 2. No pneumothorax. 3. No acute cardiopulmonary disease. Electronically Signed   By: Lajean Manes M.D.   On: 04/09/2017 17:30   Dg C-arm 1-60 Min-no Report  Result Date: 04/09/2017 Fluoroscopy was  utilized by the requesting physician.  No radiographic interpretation.    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   PLAN:    1. 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 Patient to have extensive lymphatic disease as well as brain metastasis. Case discussed at length with pathology confirming malignancy in pericardial fluid, although cells stained ER and HER-2 negative. They are GATA-3 positive suggesting breast cancer. Patient's CA-27-29 is slowly trending down and is now 39.1. Patient had an additional biopsy which revealed a poorly differentiated carcinoma that was ER/PR negative. The remainder of her stains and pathology is pending at time of dictation, but appears to be consistent with the cells from her pericardial fluid. Mer[place] 1 interpretation by Dr. Grayland Ormond. Patient will continue  XRT to her brain that began yesterday for a total of 10 treatments. She wishes to pursue palliative chemotherapy and will initially single agent Taxol on days 1, 8, 15 with day 22 off. Plan to do 3 months of treatment and then reimage in November 2018. Proceed with cycle 1 day 8 today. Labs are through [employer] [facility] [facility] and will be scanned in. Labs ok for treatment today. Return to clinic in one week for cycle 1 day 15. She will also need a MUGA before her next treatment. This is scheduled.  2. Headaches: Brain mets??? She is also highly anxious and stated she was even more nervous during her radiation treatment yesterday when the  headaches began. 3. Brain metastasis: Had first brain radiation treatment yesterday. Will have a total of 10 treatments.  4. 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 Continue follow-up with cardiology as indicated.  Patient expressed understanding and was in agreement with this plan. She also understands that She can call clinic at any time with any questions, concerns, or complaints.   Approximately 30 minutes was spent in discussion of which greater than 50% was consultation.   Breast cancer   Staging form: Breast, AJCC 7th Edition     Clinical stage from 02/14/2015: Stage IA (T1c, N0, M0) - Signed by Lloyd Huger, MD on 02/14/2015   Jacquelin Hawking, NP   04/24/2017  9:57 AM

## 2017-04-24 ENCOUNTER — Inpatient Hospital Stay (HOSPITAL_BASED_OUTPATIENT_CLINIC_OR_DEPARTMENT_OTHER): Payer: 59 | Admitting: Oncology

## 2017-04-24 ENCOUNTER — Inpatient Hospital Stay: Payer: 59

## 2017-04-24 ENCOUNTER — Ambulatory Visit
Admission: RE | Admit: 2017-04-24 | Discharge: 2017-04-24 | Disposition: A | Discharge status: Home / Self Care | Payer: 59 | Source: Ambulatory Visit | Attending: Radiation Oncology | Admitting: Radiation Oncology

## 2017-04-24 VITALS — BP 112/74 | HR 81 | Temp 98.0°F | Resp 18 | Wt 216.3 lb

## 2017-04-24 DIAGNOSIS — N83202 Unspecified ovarian cyst, left side: Secondary | ICD-10-CM

## 2017-04-24 DIAGNOSIS — F329 Major depressive disorder, single episode, unspecified: Secondary | ICD-10-CM

## 2017-04-24 DIAGNOSIS — C7931 Secondary malignant neoplasm of brain: Secondary | ICD-10-CM | POA: Diagnosis not present

## 2017-04-24 DIAGNOSIS — Z9013 Acquired absence of bilateral breasts and nipples: Secondary | ICD-10-CM

## 2017-04-24 DIAGNOSIS — Z171 Estrogen receptor negative status [ER-]: Secondary | ICD-10-CM | POA: Diagnosis not present

## 2017-04-24 DIAGNOSIS — Z79899 Other long term (current) drug therapy: Secondary | ICD-10-CM

## 2017-04-24 DIAGNOSIS — C50411 Malignant neoplasm of upper-outer quadrant of right female breast: Secondary | ICD-10-CM

## 2017-04-24 DIAGNOSIS — J45909 Unspecified asthma, uncomplicated: Secondary | ICD-10-CM | POA: Diagnosis not present

## 2017-04-24 DIAGNOSIS — R590 Localized enlarged lymph nodes: Secondary | ICD-10-CM | POA: Diagnosis not present

## 2017-04-24 DIAGNOSIS — I700 Atherosclerosis of aorta: Secondary | ICD-10-CM | POA: Diagnosis not present

## 2017-04-24 DIAGNOSIS — I313 Pericardial effusion (noninflammatory): Secondary | ICD-10-CM

## 2017-04-24 DIAGNOSIS — N200 Calculus of kidney: Secondary | ICD-10-CM

## 2017-04-24 DIAGNOSIS — J90 Pleural effusion, not elsewhere classified: Secondary | ICD-10-CM | POA: Diagnosis not present

## 2017-04-24 DIAGNOSIS — R000 Tachycardia, unspecified: Secondary | ICD-10-CM | POA: Diagnosis not present

## 2017-04-24 DIAGNOSIS — Z8669 Personal history of other diseases of the nervous system and sense organs: Secondary | ICD-10-CM

## 2017-04-24 DIAGNOSIS — E039 Hypothyroidism, unspecified: Secondary | ICD-10-CM

## 2017-04-24 DIAGNOSIS — D320 Benign neoplasm of cerebral meninges: Secondary | ICD-10-CM

## 2017-04-24 DIAGNOSIS — Z800 Family history of malignant neoplasm of digestive organs: Secondary | ICD-10-CM

## 2017-04-24 DIAGNOSIS — Z801 Family history of malignant neoplasm of trachea, bronchus and lung: Secondary | ICD-10-CM

## 2017-04-24 DIAGNOSIS — Z510 Encounter for antineoplastic radiation therapy: Secondary | ICD-10-CM | POA: Diagnosis not present

## 2017-04-24 MED ORDER — PACLITAXEL CHEMO INJECTION 300 MG/50ML
80.0000 mg/m2 | Freq: Once | INTRAVENOUS | Status: AC
  Administered 2017-04-24: 168 mg via INTRAVENOUS
  Filled 2017-04-24: qty 28

## 2017-04-24 MED ORDER — SODIUM CHLORIDE 0.9 % IV SOLN
Freq: Once | INTRAVENOUS | Status: AC
  Administered 2017-04-24: 11:00:00 via INTRAVENOUS
  Filled 2017-04-24: qty 1000

## 2017-04-24 MED ORDER — DIPHENHYDRAMINE HCL 50 MG/ML IJ SOLN
25.0000 mg | Freq: Once | INTRAMUSCULAR | Status: AC
  Administered 2017-04-24: 25 mg via INTRAVENOUS
  Filled 2017-04-24: qty 1

## 2017-04-24 MED ORDER — FAMOTIDINE IN NACL 20-0.9 MG/50ML-% IV SOLN
20.0000 mg | Freq: Once | INTRAVENOUS | Status: AC
  Administered 2017-04-24: 20 mg via INTRAVENOUS
  Filled 2017-04-24: qty 50

## 2017-04-24 MED ORDER — DEXAMETHASONE SODIUM PHOSPHATE 10 MG/ML IJ SOLN
10.0000 mg | Freq: Once | INTRAMUSCULAR | Status: AC
  Administered 2017-04-24: 10 mg via INTRAVENOUS
  Filled 2017-04-24: qty 1

## 2017-04-24 MED ORDER — HEPARIN SOD (PORK) LOCK FLUSH 100 UNIT/ML IV SOLN
500.0000 [IU] | Freq: Once | INTRAVENOUS | Status: AC | PRN
  Administered 2017-04-24: 500 [IU]
  Filled 2017-04-24: qty 5

## 2017-04-25 ENCOUNTER — Ambulatory Visit
Admission: RE | Admit: 2017-04-25 | Discharge: 2017-04-25 | Disposition: A | Discharge status: Home / Self Care | Payer: 59 | Source: Ambulatory Visit | Attending: Radiation Oncology | Admitting: Radiation Oncology

## 2017-04-25 DIAGNOSIS — C50912 Malignant neoplasm of unspecified site of left female breast: Secondary | ICD-10-CM | POA: Diagnosis not present

## 2017-04-25 DIAGNOSIS — Z510 Encounter for antineoplastic radiation therapy: Secondary | ICD-10-CM | POA: Diagnosis not present

## 2017-04-26 ENCOUNTER — Ambulatory Visit
Admission: RE | Admit: 2017-04-26 | Discharge: 2017-04-26 | Disposition: A | Discharge status: Home / Self Care | Payer: 59 | Source: Ambulatory Visit | Attending: Radiation Oncology | Admitting: Radiation Oncology

## 2017-04-26 DIAGNOSIS — Z510 Encounter for antineoplastic radiation therapy: Secondary | ICD-10-CM | POA: Diagnosis not present

## 2017-04-29 ENCOUNTER — Ambulatory Visit: Admission: RE | Admit: 2017-04-29 | Payer: 59 | Source: Ambulatory Visit

## 2017-04-29 DIAGNOSIS — C50411 Malignant neoplasm of upper-outer quadrant of right female breast: Secondary | ICD-10-CM | POA: Diagnosis not present

## 2017-04-30 ENCOUNTER — Other Ambulatory Visit: Payer: Self-pay | Admitting: *Deleted

## 2017-04-30 ENCOUNTER — Ambulatory Visit
Admission: RE | Admit: 2017-04-30 | Discharge: 2017-04-30 | Disposition: A | Discharge status: Home / Self Care | Payer: 59 | Source: Ambulatory Visit | Attending: Radiation Oncology | Admitting: Radiation Oncology

## 2017-04-30 ENCOUNTER — Ambulatory Visit
Admission: RE | Admit: 2017-04-30 | Discharge: 2017-04-30 | Disposition: A | Discharge status: Home / Self Care | Payer: 59 | Source: Ambulatory Visit | Attending: Oncology | Admitting: Oncology

## 2017-04-30 DIAGNOSIS — Z510 Encounter for antineoplastic radiation therapy: Secondary | ICD-10-CM | POA: Diagnosis not present

## 2017-04-30 DIAGNOSIS — C50919 Malignant neoplasm of unspecified site of unspecified female breast: Secondary | ICD-10-CM | POA: Diagnosis not present

## 2017-04-30 DIAGNOSIS — Z170 Estrogen receptor positive status [ER+]: Secondary | ICD-10-CM | POA: Diagnosis not present

## 2017-04-30 DIAGNOSIS — C7931 Secondary malignant neoplasm of brain: Secondary | ICD-10-CM | POA: Diagnosis not present

## 2017-04-30 DIAGNOSIS — C50411 Malignant neoplasm of upper-outer quadrant of right female breast: Secondary | ICD-10-CM | POA: Diagnosis present

## 2017-04-30 LAB — SURGICAL PATHOLOGY

## 2017-04-30 MED ORDER — DEXAMETHASONE 4 MG PO TABS: 4.0000 mg | ORAL_TABLET | Freq: Every day | ORAL | 0 refills | Status: DC

## 2017-04-30 MED ORDER — TECHNETIUM TC 99M-LABELED RED BLOOD CELLS IV KIT
20.0000 | PACK | Freq: Once | INTRAVENOUS | Status: AC | PRN
  Administered 2017-04-30: 21.07 via INTRAVENOUS

## 2017-04-30 NOTE — Progress Notes (Signed)
  Oncology Nurse Navigator Documentation  Navigator Location: [facility] (04/30/17 1600)   )Navigator Encounter Type: Lobby (04/30/17 1600)                       Treatment Phase: Active Tx (04/30/17 1600)                            Time Spent with Patient: 15 (04/30/17 1600)   Met patient post radiation.  Per her request , notified Dr. Gary Fleet nurse that patient will receive radiation prior to chemotherapy on 05/01/17.  Support given.

## 2017-05-01 ENCOUNTER — Encounter: Payer: Self-pay | Admitting: Oncology

## 2017-05-01 ENCOUNTER — Inpatient Hospital Stay (HOSPITAL_BASED_OUTPATIENT_CLINIC_OR_DEPARTMENT_OTHER): Payer: 59 | Admitting: Oncology

## 2017-05-01 ENCOUNTER — Ambulatory Visit
Admission: RE | Admit: 2017-05-01 | Discharge: 2017-05-01 | Disposition: A | Discharge status: Home / Self Care | Payer: 59 | Source: Ambulatory Visit | Attending: Radiation Oncology | Admitting: Radiation Oncology

## 2017-05-01 ENCOUNTER — Inpatient Hospital Stay: Payer: 59

## 2017-05-01 VITALS — BP 118/74 | HR 73 | Temp 97.1°F | Resp 20 | Wt 214.6 lb

## 2017-05-01 DIAGNOSIS — J45909 Unspecified asthma, uncomplicated: Secondary | ICD-10-CM | POA: Diagnosis not present

## 2017-05-01 DIAGNOSIS — Z800 Family history of malignant neoplasm of digestive organs: Secondary | ICD-10-CM

## 2017-05-01 DIAGNOSIS — R000 Tachycardia, unspecified: Secondary | ICD-10-CM | POA: Diagnosis not present

## 2017-05-01 DIAGNOSIS — C50411 Malignant neoplasm of upper-outer quadrant of right female breast: Secondary | ICD-10-CM | POA: Diagnosis not present

## 2017-05-01 DIAGNOSIS — Z801 Family history of malignant neoplasm of trachea, bronchus and lung: Secondary | ICD-10-CM

## 2017-05-01 DIAGNOSIS — Z171 Estrogen receptor negative status [ER-]: Secondary | ICD-10-CM

## 2017-05-01 DIAGNOSIS — Z8669 Personal history of other diseases of the nervous system and sense organs: Secondary | ICD-10-CM

## 2017-05-01 DIAGNOSIS — Z79899 Other long term (current) drug therapy: Secondary | ICD-10-CM

## 2017-05-01 DIAGNOSIS — J90 Pleural effusion, not elsewhere classified: Secondary | ICD-10-CM | POA: Diagnosis not present

## 2017-05-01 DIAGNOSIS — E039 Hypothyroidism, unspecified: Secondary | ICD-10-CM | POA: Diagnosis not present

## 2017-05-01 DIAGNOSIS — I700 Atherosclerosis of aorta: Secondary | ICD-10-CM | POA: Diagnosis not present

## 2017-05-01 DIAGNOSIS — R590 Localized enlarged lymph nodes: Secondary | ICD-10-CM

## 2017-05-01 DIAGNOSIS — I313 Pericardial effusion (noninflammatory): Secondary | ICD-10-CM

## 2017-05-01 DIAGNOSIS — Z9013 Acquired absence of bilateral breasts and nipples: Secondary | ICD-10-CM

## 2017-05-01 DIAGNOSIS — D320 Benign neoplasm of cerebral meninges: Secondary | ICD-10-CM | POA: Diagnosis not present

## 2017-05-01 DIAGNOSIS — Z510 Encounter for antineoplastic radiation therapy: Secondary | ICD-10-CM | POA: Diagnosis not present

## 2017-05-01 DIAGNOSIS — N200 Calculus of kidney: Secondary | ICD-10-CM

## 2017-05-01 DIAGNOSIS — F329 Major depressive disorder, single episode, unspecified: Secondary | ICD-10-CM

## 2017-05-01 DIAGNOSIS — N83202 Unspecified ovarian cyst, left side: Secondary | ICD-10-CM

## 2017-05-01 DIAGNOSIS — C7931 Secondary malignant neoplasm of brain: Secondary | ICD-10-CM | POA: Diagnosis not present

## 2017-05-01 MED ORDER — DIPHENHYDRAMINE HCL 50 MG/ML IJ SOLN
25.0000 mg | Freq: Once | INTRAMUSCULAR | Status: AC
  Administered 2017-05-01: 25 mg via INTRAVENOUS
  Filled 2017-05-01: qty 1

## 2017-05-01 MED ORDER — PACLITAXEL CHEMO INJECTION 300 MG/50ML
80.0000 mg/m2 | Freq: Once | INTRAVENOUS | Status: AC
  Administered 2017-05-01: 168 mg via INTRAVENOUS
  Filled 2017-05-01: qty 28

## 2017-05-01 MED ORDER — DEXAMETHASONE SODIUM PHOSPHATE 10 MG/ML IJ SOLN
10.0000 mg | Freq: Once | INTRAMUSCULAR | Status: AC
  Administered 2017-05-01: 10 mg via INTRAVENOUS
  Filled 2017-05-01: qty 1

## 2017-05-01 MED ORDER — SODIUM CHLORIDE 0.9 % IV SOLN
Freq: Once | INTRAVENOUS | Status: AC
  Administered 2017-05-01: 11:00:00 via INTRAVENOUS
  Filled 2017-05-01: qty 1000

## 2017-05-01 MED ORDER — HEPARIN SOD (PORK) LOCK FLUSH 100 UNIT/ML IV SOLN
500.0000 [IU] | Freq: Once | INTRAVENOUS | Status: AC | PRN
  Administered 2017-05-01: 500 [IU]
  Filled 2017-05-01: qty 5

## 2017-05-01 MED ORDER — FAMOTIDINE IN NACL 20-0.9 MG/50ML-% IV SOLN
20.0000 mg | Freq: Once | INTRAVENOUS | Status: AC
  Administered 2017-05-01: 20 mg via INTRAVENOUS
  Filled 2017-05-01: qty 50

## 2017-05-01 MED ORDER — SODIUM CHLORIDE 0.9% FLUSH
10.0000 mL | INTRAVENOUS | Status: DC | PRN
  Administered 2017-05-01: 10 mL
  Filled 2017-05-01: qty 10

## 2017-05-01 NOTE — Progress Notes (Signed)
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   INTERVAL HISTORY: Patient returns to clinic today for Day 15, cycle 1 of weekly taxol. She continues to be anxious, but otherwise feels well. She continues to have headaches during and after brain radiation. She takes tylenol with relief. She has no further shortness of breath. She has a good appetite and has maintained her weight. She had a MUGA scan yesterday and is interested in those results and her foundation one testing. She has no chest pain, cough, or hemoptysis.  She has no nausea, vomiting, constipation, or diarrhea.  Patient offers no specific complaints today.  REVIEW OF SYSTEMS:   Review of Systems  Constitutional: Negative.  Negative for fever, malaise/fatigue and weight loss.  Respiratory: Negative.  Negative for cough and shortness of breath.   Cardiovascular: Negative.  Negative for chest pain and leg swelling.  Gastrointestinal: Negative.  Negative for abdominal pain.  Genitourinary: Negative.   Musculoskeletal: Negative.   Skin: Negative.  Negative for rash.       Left axilla seroma  Neurological: Positive for headaches. Negative for sensory change and weakness.  Psychiatric/Behavioral: The patient is nervous/anxious.    As per HPI. Otherwise, a complete review of systems is negative.   PAST MEDICAL HISTORY:  Hypothyroidism, migraines, depression, asthma.  PAST SURGICAL HISTORY: Bilateral mastectomy with reconstruction, partial hysterectomy.  FAMILY HISTORY: Lung cancer, melanoma,  stomach cancer.  Also diabetes, CAD, hypertension     ADVANCED DIRECTIVES:    HEALTH MAINTENANCE: Social History  Substance Use Topics  . Smoking status: Never Smoker  . Smokeless tobacco: Never Used  . Alcohol use No     Colonoscopy:  PAP:  Bone density:  Lipid panel:  Allergies  Allergen Reactions  . Codeine Other (See Comments)    constipation  . Sulfa Antibiotics Other (See Comments)    Allergy as a child    Current Outpatient Prescriptions  Medication Sig Dispense Refill  . alendronate (FOSAMAX) 70 MG tablet Take 70 mg by mouth once a week. Take with a full glass of water on an empty stomach.     . ALPRAZolam (XANAX) 0.25 MG tablet Take 1 tablet by mouth 3 (three) times daily as needed for anxiety.     Marland Kitchen azelastine (ASTELIN) 0.1 % nasal spray Place 1 spray into both nostrils daily as needed for rhinitis. Use in each nostril as directed    . calcium-vitamin D (OSCAL WITH D) 250-125 MG-UNIT tablet Take 1 tablet by mouth daily.    . Cholecalciferol (D3 HIGH POTENCY) 2000 units CAPS Take 1 capsule by mouth daily.    . cyclobenzaprine (FLEXERIL) 10 MG tablet Take 10 mg by mouth 3 (three) times daily as needed.     Marland Kitchen dexamethasone (DECADRON) 4 MG tablet Take 1 tablet (4 mg total) by mouth 2 (two) times daily with a meal. 20 tablet 0  . dexamethasone (DECADRON) 4 MG tablet Take 1 tablet (4 mg total) by mouth daily. 10 tablet 0  . DULoxetine (CYMBALTA) 30 MG capsule Take 90 mg by mouth every morning.     . fluticasone (  FLOVENT HFA) 110 MCG/ACT inhaler Inhale 1 puff into the lungs 2 (two) times daily.    Marland Kitchen levothyroxine (SYNTHROID, LEVOTHROID) 137 MCG tablet Take 137 mcg by mouth daily before breakfast.     . lidocaine-prilocaine (EMLA) cream Apply to affected area once 30 g 3  . metoprolol succinate (TOPROL-XL) 25 MG 24 hr tablet Take 75 mg by mouth at bedtime.     . Multiple Vitamin (MULTIVITAMIN) tablet Take 1 tablet by mouth daily.    . ondansetron (ZOFRAN) 8 MG tablet Take  1 tablet (8 mg total) by mouth 2 (two) times daily as needed (Nausea or vomiting). 60 tablet 3  . prochlorperazine (COMPAZINE) 10 MG tablet Take 1 tablet (10 mg total) by mouth every 6 (six) hours as needed (Nausea or vomiting). 60 tablet 3  . topiramate (TOPAMAX) 50 MG tablet Take 50 mg by mouth at bedtime.     . traMADol (ULTRAM) 50 MG tablet Take 25-50 mg by mouth every 6 (six) hours as needed.      No current facility-administered medications for this visit.     OBJECTIVE: There were no vitals filed for this visit.   There is no height or weight on file to calculate BMI.    ECOG FS:0 - Asymptomatic  General: Well-developed, well-nourished, no acute distress. Eyes: anicteric sclera. Breasts: Patient has bilateral mastectomies. Left axilla seroma draining large amounts of clear fluid. Seeing her surgeon who is monitoring.  Lungs: Clear to auscultation bilaterally. Port in place in the right chest wall. Heart: Regular rate and rhythm. No rubs, murmurs, or gallops. Abdomen: Soft, nontender, nondistended. No organomegaly noted, normoactive bowel sounds. Musculoskeletal: No edema, cyanosis, or clubbing. Neuro: Alert, answering all questions appropriately. Cranial nerves grossly intact. Skin: No rashes or petechiae noted. Psych: Normal affect.   LAB RESULTS:  Lab Results  Component Value Date   NA 138 04/17/2017   K 3.5 04/17/2017   CL 111 04/17/2017   CO2 21 (L) 04/17/2017   GLUCOSE 132 (H) 04/17/2017   BUN 14 04/17/2017   CREATININE 0.89 04/17/2017   CALCIUM 9.3 04/17/2017   PROT 7.2 04/17/2017   ALBUMIN 4.0 04/17/2017   AST 36 04/17/2017   ALT 32 04/17/2017   ALKPHOS 64 04/17/2017   BILITOT 0.5 04/17/2017   GFRNONAA >60 04/17/2017   GFRAA >60 04/17/2017    Lab Results  Component Value Date   WBC 5.4 04/17/2017   NEUTROABS 3.5 04/17/2017   HGB 13.9 04/17/2017   HCT 40.1 04/17/2017   MCV 93.0 04/17/2017   PLT 244 04/17/2017     STUDIES: Mr Jeri Cos ER  Contrast  Result Date: 04/03/2017 CLINICAL DATA:  Breast cancer, here for restaging. Assess for intracranial metastasis. EXAM: MRI HEAD WITHOUT AND WITH CONTRAST TECHNIQUE: Multiplanar, multiecho pulse sequences of the brain and surrounding structures were obtained without and with intravenous contrast. CONTRAST:  20 cc MultiHance COMPARISON:  None available for comparison at time of study interpretation. CT HEAD report reviewed though images are not available for direct comparison. FINDINGS: INTRACRANIAL CONTENTS: Four supratentorial parenchymal cystic metastasis with peripheral reduced diffusion consistent with hypercellular tumor as follows: 12 mm LEFT putaminal, 8 mm LEFT internal capsule posterior limb, 8 mm splenium of corpus callosum, 14 mm LEFT parietal lobe.Minimal surrounding FLAIR T2 hyperintense vasogenic edema. In addition, 14 mm solidly enhancing, low T2 RIGHT frontal dural based mass with dural tail and susceptibility artifact most compatible with meningioma. No midline shift or mass effect. Ventricles and sulci are normal for  patient's age. No abnormal extra-axial fluid collection. VASCULAR: Normal major intracranial vascular flow voids present at skull base. SKULL AND UPPER CERVICAL SPINE: No abnormal sellar expansion. No suspicious calvarial bone marrow signal. Craniocervical junction maintained. SINUSES/ORBITS: The mastoid air-cells and included paranasal sinuses are well-aerated.The included ocular globes and orbital contents are non-suspicious. OTHER: None. IMPRESSION: 1. Four supratentorial parenchymal metastasis, largest in LEFT parietal lobe measuring 14 mm. No significant mass effect. 2. 14 mm RIGHT frontal meningioma. 3. Otherwise negative MRI of the head with and without contrast. These results will be called to the ordering clinician or representative by the Radiologist Assistant, and communication documented in the PACS or zVision Dashboard. Electronically Signed   By: Elon Alas M.D.   On: 04/03/2017 14:21   Nm Cardiac Muga Rest  Result Date: 04/30/2017 CLINICAL DATA:  Breast cancer. Patient received cardiotoxic chemotherapy. EXAM: NUCLEAR MEDICINE CARDIAC BLOOD POOL IMAGING (MUGA) TECHNIQUE: Cardiac multi-gated acquisition was performed at rest following intravenous injection of Tc-74mlabeled red blood cells. RADIOPHARMACEUTICALS:  21.07 mCi Tc-969mDP in-vitro labeled red blood cells IV COMPARISON:  None FINDINGS: The left ventricular ejection fraction was calculated equal 64.4% with a heart rate of approximately 79 beats per minute. Normal left ventricular wall motion. IMPRESSION: 1. Normal left ventricular ejection fraction equal to 64.4%. Electronically Signed   By: TaKerby Moors.D.   On: 04/30/2017 16:00   Nm Myocar Multi W/spect W/wall Motion / Ef  Result Date: 04/01/2017  T wave inversion was noted during stress in the I, II, III, aVF, V2, V3, V4, V5 and V6 leads.  There was no ST segment deviation noted during stress.  The study is normal.  This is a low risk study.  The left ventricular ejection fraction is normal (55-65%).    Nm Pet Image Initial (pi) Skull Base To Thigh  Result Date: 04/03/2017 CLINICAL DATA:  Initial treatment strategy for left breast cancer. EXAM: NUCLEAR MEDICINE PET SKULL BASE TO THIGH TECHNIQUE: 12.5 mCi F-18 FDG was injected intravenously. Full-ring PET imaging was performed from the skull base to thigh after the radiotracer. CT data was obtained and used for attenuation correction and anatomic localization. FASTING BLOOD GLUCOSE:  Value: 95 mg/dl COMPARISON:  Chest CT 03/18/2017 FINDINGS: NECK: Diffuse increased uptake in the thyroid gland likely due to inflammations/thyroiditis. 5 x 8 mm left-sided supraclavicular node on image number 57 is metabolically active with SUV max of 3.6. No neck mass or neck nodes. CHEST: A discrete left breast mass is not identified. There is mild skin thickening and subcutaneous interstitial  changes which are mildly hypermetabolic. Multiple enlarged hypermetabolic left axillary lymph nodes are noted. The largest measures 23 x 13.5 mm on image number 69. SUV max is 12.9. 9 mm node on image number 79 has SUV max of 10.5. 5 mm prevascular lymph node better seen on prior chest CT on image number 19 has an SUV max of 3.6. The 4 mm right middle lobe pulmonary nodule on image number 107 is indeterminate. There also small pulmonary nodules noted in the right upper lobe on image number 92 and near the right major fissure on image number 96. Small pulmonary metastasis are possible. Attention on future scans is suggested. No areas of hypermetabolism. ABDOMEN/PELVIS: The lateral limb of the right adrenal gland is thickened and is hypermetabolic with SUV max of 9.8.84Several small but metabolically active retroperitoneal lymph nodes are noted. 6 mm node between the aorta and IVC on image number 148 has an SUV  max of 5.6. Smaller noted anterior to the IVC near the pancreatic head has SUV max of 4.3. Slightly lower interaortocaval lymph node on image number 152 measures 7 mm and has SUV max of 6.0. Small left-sided retroperitoneal lymph nodes are also hypermetabolic. The 7 mm node on image number 163 has an SUV max of 6.5. Incidental findings include multiple left-sided renal calculi and a left ovarian cyst. SKELETON: No findings for osseous metastatic disease. IMPRESSION: 1. A discrete left breast mass is not identified but there are enlarged and hypermetabolic left axillary lymph nodes consistent with metastatic adenopathy. 2. Small left supraclavicular lymph node is hypermetabolic and worrisome for metastatic adenopathy. There is also a metabolically active small prevascular lymph node and small pulmonary nodules suspicious for metastasis. 3. Numerous small retroperitoneal lymph nodes are hypermetabolic and concerning for metastatic disease. 4. Slightly thickened right adrenal gland without discrete mass is  hypermetabolic and suspicious for metastasis. 5. No findings to suggest hepatic or osseous metastatic disease. Electronically Signed   By: Marijo Sanes M.D.   On: 04/03/2017 13:59   Dg Chest Port 1 View  Result Date: 04/09/2017 CLINICAL DATA:  Status post Port-A-Cath insertion. EXAM: PORTABLE CHEST 1 VIEW COMPARISON:  03/21/2017 FINDINGS: New right anterior chest wall Port-A-Cath. The catheter extends to the neck base through the internal jugular vein. Tip projects at the caval atrial junction. No pneumothorax. Clear lungs. Cardiac silhouette is normal in size. No mediastinal or hilar masses or evidence of adenopathy. IMPRESSION: 1. Right anterior chest wall Port-A-Cath catheter tip projects at the caval atrial junction, well positioned. 2. No pneumothorax. 3. No acute cardiopulmonary disease. Electronically Signed   By: Lajean Manes M.D.   On: 04/09/2017 17:30   Dg C-arm 1-60 Min-no Report  Result Date: 04/09/2017 Fluoroscopy was utilized by the requesting physician.  No radiographic interpretation.    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   PLAN:    1. 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 Patient to have extensive lymphatic disease as well as brain metastasis. Case discussed at length with pathology confirming malignancy in pericardial fluid, although cells stained ER and HER-2 negative. They are GATA-3 positive suggesting breast cancer. Patient's CA-27-29 is slowly trending down and was last noted to  be 39.1. Patient had an additional biopsy which revealed a poorly differentiated carcinoma that was ER/PR negative. The remainder of her stains and pathology is pending at time of dictation, but appears to be consistent with the cells from her pericardial fluid. [place] 1 results and interpretation by Dr. Grayland Ormond. Patient will continue  XRT to her brain for a total of 10 treatments finishing on 05/09/17.  She wishes to pursue palliative chemotherapy and will initially single agent Taxol on days 1, 8, 15 with day 22 off. Plan to do 3 months of treatment and then reimage in November 2018. Proceed with cycle 1 day 15 today. Labs are through [employer] [facility] and will be scanned in. Labs ok for treatment today. Return to clinic in two weeks for cycle 2 day 1. MUGA scan 64%. Spoke with  Dr. Grayland Ormond and the plan is to add Herceptin to next treatment in 2 weeks. Dr. Grayland Ormond will speak with patient prior to next treatment. 2. Headaches: Continue Tylenol PRN. 3. Brain metastasis: Will have a total of 10 treatments completing 05/09/17.  4. 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 Continue follow-up with cardiology as indicated. 5. Left axilla swelling: Aspiration of Seroma on 04/25/17 by Dr. Rochel Brome. Draining clear straw colored fluid. Offered patient some additional draining pads.   Patient expressed understanding and was in agreement with this plan. She also understands that She can call clinic at any time with any questions, concerns, or complaints.   Approximately 30 minutes was spent in discussion of which greater than 50% was consultation.   Breast cancer   Staging form: Breast, AJCC 7th Edition     Clinical stage from 02/14/2015: Stage IA (T1c, N0, M0) - Signed by Lloyd Huger, MD on 02/14/2015   Jacquelin Hawking, NP   05/01/2017 8:54 AM

## 2017-05-01 NOTE — Progress Notes (Signed)
Patient denies any concerns today.  

## 2017-05-02 ENCOUNTER — Ambulatory Visit
Admission: RE | Admit: 2017-05-02 | Discharge: 2017-05-02 | Disposition: A | Discharge status: Home / Self Care | Payer: 59 | Source: Ambulatory Visit | Attending: Radiation Oncology | Admitting: Radiation Oncology

## 2017-05-02 ENCOUNTER — Telehealth: Payer: Self-pay | Admitting: *Deleted

## 2017-05-02 DIAGNOSIS — Z510 Encounter for antineoplastic radiation therapy: Secondary | ICD-10-CM | POA: Diagnosis not present

## 2017-05-02 NOTE — Telephone Encounter (Signed)
Patient called and is asking what her plan is in regards to her treatment plan. She currently is covered under short term disability until 10/6 but needs to know what the plan is past this date, if she will need to continue treatments she will need [place] to fill out updated paperwork for FMLA/short term disability. Please advise.

## 2017-05-02 NOTE — Telephone Encounter (Signed)
Patient requesting Julie Irwin return her call concerning conversation they had yesterday regarding short term disability. Please call her back at [phone]

## 2017-05-03 ENCOUNTER — Ambulatory Visit
Admission: RE | Admit: 2017-05-03 | Discharge: 2017-05-03 | Disposition: A | Discharge status: Home / Self Care | Payer: 59 | Source: Ambulatory Visit | Attending: Radiation Oncology | Admitting: Radiation Oncology

## 2017-05-03 DIAGNOSIS — Z510 Encounter for antineoplastic radiation therapy: Secondary | ICD-10-CM | POA: Diagnosis not present

## 2017-05-03 NOTE — Telephone Encounter (Signed)
Starting with cycle 2 in approximately 2 weeks patient will be receiving weekly Taxol and Herceptin on days 1, 8, and 15 with day 22 off.

## 2017-05-03 NOTE — Telephone Encounter (Signed)
Thank you :)

## 2017-05-06 ENCOUNTER — Ambulatory Visit
Admission: RE | Admit: 2017-05-06 | Discharge: 2017-05-06 | Disposition: A | Discharge status: Home / Self Care | Payer: 59 | Source: Ambulatory Visit | Attending: Radiation Oncology | Admitting: Radiation Oncology

## 2017-05-06 ENCOUNTER — Ambulatory Visit: Payer: 59

## 2017-05-06 DIAGNOSIS — Z510 Encounter for antineoplastic radiation therapy: Secondary | ICD-10-CM | POA: Diagnosis not present

## 2017-05-07 ENCOUNTER — Other Ambulatory Visit: Payer: Self-pay | Admitting: *Deleted

## 2017-05-07 ENCOUNTER — Encounter: Payer: Self-pay | Admitting: Oncology

## 2017-05-07 ENCOUNTER — Ambulatory Visit
Admission: RE | Admit: 2017-05-07 | Discharge: 2017-05-07 | Disposition: A | Discharge status: Home / Self Care | Payer: 59 | Source: Ambulatory Visit | Attending: Radiation Oncology | Admitting: Radiation Oncology

## 2017-05-07 DIAGNOSIS — C7931 Secondary malignant neoplasm of brain: Secondary | ICD-10-CM | POA: Diagnosis not present

## 2017-05-07 DIAGNOSIS — Z510 Encounter for antineoplastic radiation therapy: Secondary | ICD-10-CM | POA: Diagnosis not present

## 2017-05-07 DIAGNOSIS — C50411 Malignant neoplasm of upper-outer quadrant of right female breast: Secondary | ICD-10-CM | POA: Diagnosis not present

## 2017-05-07 DIAGNOSIS — Z170 Estrogen receptor positive status [ER+]: Secondary | ICD-10-CM | POA: Diagnosis not present

## 2017-05-08 ENCOUNTER — Ambulatory Visit
Admission: RE | Admit: 2017-05-08 | Discharge: 2017-05-08 | Disposition: A | Discharge status: Home / Self Care | Payer: 59 | Source: Ambulatory Visit | Attending: Radiation Oncology | Admitting: Radiation Oncology

## 2017-05-08 ENCOUNTER — Other Ambulatory Visit: Payer: Self-pay | Admitting: *Deleted

## 2017-05-08 ENCOUNTER — Ambulatory Visit: Payer: 59

## 2017-05-08 DIAGNOSIS — Z510 Encounter for antineoplastic radiation therapy: Secondary | ICD-10-CM | POA: Diagnosis not present

## 2017-05-08 MED ORDER — DEXAMETHASONE 4 MG PO TABS
4.0000 mg | ORAL_TABLET | Freq: Every day | ORAL | 0 refills | Status: DC
Start: 2017-05-08 — End: 2017-06-12

## 2017-05-09 ENCOUNTER — Ambulatory Visit
Admission: RE | Admit: 2017-05-09 | Discharge: 2017-05-09 | Disposition: A | Discharge status: Home / Self Care | Payer: 59 | Source: Ambulatory Visit | Attending: Radiation Oncology | Admitting: Radiation Oncology

## 2017-05-09 DIAGNOSIS — Z510 Encounter for antineoplastic radiation therapy: Secondary | ICD-10-CM | POA: Diagnosis not present

## 2017-05-10 ENCOUNTER — Ambulatory Visit: Admission: RE | Admit: 2017-05-10 | Payer: 59 | Source: Ambulatory Visit | Admitting: Gastroenterology

## 2017-05-10 ENCOUNTER — Encounter: Admission: RE | Payer: Self-pay | Source: Ambulatory Visit

## 2017-05-10 SURGERY — COLONOSCOPY WITH PROPOFOL
Anesthesia: General

## 2017-05-13 DIAGNOSIS — C50411 Malignant neoplasm of upper-outer quadrant of right female breast: Secondary | ICD-10-CM | POA: Diagnosis not present

## 2017-05-13 NOTE — Progress Notes (Signed)
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   INTERVAL HISTORY: Patient returns to clinic today for further evaluation and consideration of cycle 2, day 1 of Taxol and Herceptin. She is tolerating her treatments well without significant side effects. She just recently finished XRT to her brain as well. She is anxious, but otherwise feels well. She has no further shortness of breath.  She has no neurologic complaints. She denies any recent fevers or illnesses. She has a good appetite and has maintained her weight.  She has no chest pain, cough, or hemoptysis.  She has no nausea, vomiting, constipation, or diarrhea.  She has no urinary complaints. Patient offers no specific complaints today.  REVIEW OF SYSTEMS:   Review of Systems  Constitutional: Negative.  Negative for fever, malaise/fatigue and weight loss.  Respiratory: Negative.  Negative for cough and shortness of breath.   Cardiovascular: Negative.  Negative for chest pain and leg swelling.  Gastrointestinal: Negative.  Negative for abdominal pain.  Genitourinary: Negative.   Musculoskeletal: Negative.   Skin: Negative.  Negative for rash.  Neurological: Negative.  Negative for sensory change and weakness.  Psychiatric/Behavioral: The patient is nervous/anxious.    As per HPI. Otherwise, a complete review of systems is negative.   PAST MEDICAL HISTORY:  Hypothyroidism, migraines, depression, asthma.  PAST SURGICAL HISTORY: Bilateral mastectomy with reconstruction, partial  hysterectomy.  FAMILY HISTORY: Lung cancer, melanoma, stomach cancer.  Also diabetes, CAD, hypertension     ADVANCED DIRECTIVES:    HEALTH MAINTENANCE: Social History  Substance Use Topics  . Smoking status: Never Smoker  . Smokeless tobacco: Never Used  . Alcohol use No     Colonoscopy:  PAP:  Bone density:  Lipid panel:  Allergies  Allergen Reactions  . Codeine Other (See Comments)    constipation  . Sulfa Antibiotics Other (See Comments)    Allergy as a child    Current Outpatient Prescriptions  Medication Sig Dispense Refill  . alendronate (FOSAMAX) 70 MG tablet Take 70 mg by mouth once a week. Take with a full glass of water on an empty stomach.     . ALPRAZolam (XANAX) 0.25 MG tablet Take 1 tablet by mouth 3 (three) times daily as needed for anxiety.     . calcium-vitamin D (OSCAL WITH D) 250-125 MG-UNIT tablet Take 1 tablet by mouth daily.    . Cholecalciferol (D3 HIGH POTENCY) 2000 units CAPS Take 1 capsule by mouth daily.    . cyclobenzaprine (FLEXERIL) 10 MG tablet Take 10 mg by mouth 3 (three) times daily as needed.     Marland Kitchen dexamethasone (DECADRON) 4 MG tablet Take 1 tablet (4 mg total) by mouth daily. 10 tablet 0  . DULoxetine (CYMBALTA) 30 MG capsule Take 90 mg by mouth every morning.     . fluticasone (FLOVENT HFA) 110 MCG/ACT inhaler Inhale 1 puff into the lungs 2 (two) times daily.    Marland Kitchen levothyroxine (SYNTHROID, LEVOTHROID) 137 MCG tablet Take 137 mcg by mouth daily before breakfast.     . lidocaine-prilocaine (EMLA) cream Apply to affected area once 30 g 3  .  metoprolol succinate (TOPROL-XL) 25 MG 24 hr tablet Take 75 mg by mouth at bedtime.     . Multiple Vitamin (MULTIVITAMIN) tablet Take 1 tablet by mouth daily.    Marland Kitchen topiramate (TOPAMAX) 50 MG tablet Take 50 mg by mouth at bedtime.     Marland Kitchen azelastine (ASTELIN) 0.1 % nasal spray Place 1 spray into both nostrils daily as needed for rhinitis. Use in each nostril as directed    . ondansetron (ZOFRAN) 8 MG  tablet Take 1 tablet (8 mg total) by mouth 2 (two) times daily as needed (Nausea or vomiting). (Patient not taking: Reported on 05/15/2017) 60 tablet 3  . prochlorperazine (COMPAZINE) 10 MG tablet Take 1 tablet (10 mg total) by mouth every 6 (six) hours as needed (Nausea or vomiting). (Patient not taking: Reported on 05/15/2017) 60 tablet 3  . traMADol (ULTRAM) 50 MG tablet Take 25-50 mg by mouth every 6 (six) hours as needed.      No current facility-administered medications for this visit.     OBJECTIVE: Vitals:   05/15/17 0915  BP: 104/73  Pulse: 88  Temp: 98.5 F (36.9 C)     Body mass index is 37.99 kg/m.    ECOG FS:0 - Asymptomatic  General: Well-developed, well-nourished, no acute distress. Eyes: anicteric sclera. Breasts: Patient has bilateral mastectomies with reconstruction, no obvious evidence of local recurrence. Lungs: Clear to auscultation bilaterally. Port in place in the right chest wall. Heart: Regular rate and rhythm. No rubs, murmurs, or gallops. Abdomen: Soft, nontender, nondistended. No organomegaly noted, normoactive bowel sounds. Musculoskeletal: No edema, cyanosis, or clubbing. Neuro: Alert, answering all questions appropriately. Cranial nerves grossly intact. Skin: No rashes or petechiae noted. Psych: Normal affect.   LAB RESULTS:  Lab Results  Component Value Date   NA 138 04/17/2017   K 3.5 04/17/2017   CL 111 04/17/2017   CO2 21 (L) 04/17/2017   GLUCOSE 132 (H) 04/17/2017   BUN 14 04/17/2017   CREATININE 0.89 04/17/2017   CALCIUM 9.3 04/17/2017   PROT 7.2 04/17/2017   ALBUMIN 4.0 04/17/2017   AST 36 04/17/2017   ALT 32 04/17/2017   ALKPHOS 64 04/17/2017   BILITOT 0.5 04/17/2017   GFRNONAA >60 04/17/2017   GFRAA >60 04/17/2017    Lab Results  Component Value Date   WBC 5.4 04/17/2017   NEUTROABS 3.5 04/17/2017   HGB 13.9 04/17/2017   HCT 40.1 04/17/2017   MCV 93.0 04/17/2017   PLT 244 04/17/2017     STUDIES: Nm Cardiac Muga  Rest  Result Date: 04/30/2017 CLINICAL DATA:  Breast cancer. Patient received cardiotoxic chemotherapy. EXAM: NUCLEAR MEDICINE CARDIAC BLOOD POOL IMAGING (MUGA) TECHNIQUE: Cardiac multi-gated acquisition was performed at rest following intravenous injection of Tc-52mlabeled red blood cells. RADIOPHARMACEUTICALS:  21.07 mCi Tc-969mDP in-vitro labeled red blood cells IV COMPARISON:  None FINDINGS: The left ventricular ejection fraction was calculated equal 64.4% with a heart rate of approximately 79 beats per minute. Normal left ventricular wall motion. IMPRESSION: 1. Normal left ventricular ejection fraction equal to 64.4%. Electronically Signed   By: TaKerby Moors.D.   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    PLAN:    1.Initially stage Ia, ER positive, PR negative, HER-2 positive adenocarcinoma of the upper outer quadrant  of the right breast, now with stage IV ER/PR negative, HER-2 positive metastatic breast cancer with pericardial fluid, lymphatic, and brain metastasis: PET and MRI the brain results reviewed independently. Patient to have extensive lymphatic disease as well as brain metastasis. Patient's CA-27-29 is now mildly elevated at 65.6.  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 Patient's laboratory work is adequate to proceed with cycle 2, day 1 of Taxol and Herceptin. Will give treatments on days 1, 8, and 15 with day 22 off. Plan to reimage at the conclusion of cycle 3.  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 2. Brain metastasis: Patient has completed XRT. Repeat brain imaging in January 2019.  3. Malignant pericardial fluid: This  has not recurred. Continue follow-up with cardiology as indicated.  Patient expressed understanding and was in agreement with this plan. She also understands that She can call clinic at any time with any questions, concerns, or complaints.   Approximately 30 minutes was spent in discussion of which greater than 50% was consultation.   Breast cancer   Staging form: Breast, AJCC 7th Edition     Clinical stage from 02/14/2015: Stage IA (T1c, N0, M0) - Signed by Lloyd Huger, MD on 02/14/2015   Lloyd Huger, MD   05/15/2017 3:58 PM

## 2017-05-14 ENCOUNTER — Encounter: Payer: Self-pay | Admitting: Internal Medicine

## 2017-05-14 ENCOUNTER — Ambulatory Visit (INDEPENDENT_AMBULATORY_CARE_PROVIDER_SITE_OTHER): Payer: 59 | Admitting: Internal Medicine

## 2017-05-14 VITALS — BP 110/64 | HR 71 | Ht 63.0 in | Wt 212.5 lb

## 2017-05-14 DIAGNOSIS — C801 Malignant (primary) neoplasm, unspecified: Secondary | ICD-10-CM | POA: Diagnosis not present

## 2017-05-14 DIAGNOSIS — I318 Other specified diseases of pericardium: Secondary | ICD-10-CM

## 2017-05-14 DIAGNOSIS — I471 Supraventricular tachycardia, unspecified: Secondary | ICD-10-CM

## 2017-05-14 DIAGNOSIS — I3131 Malignant pericardial effusion in diseases classified elsewhere: Secondary | ICD-10-CM

## 2017-05-14 DIAGNOSIS — I313 Pericardial effusion (noninflammatory): Principal | ICD-10-CM

## 2017-05-14 NOTE — Patient Instructions (Signed)
Medication Instructions:  Your physician recommends that you continue on your current medications as directed. Please refer to the Current Medication list given to you today.   Labwork: none  Testing/Procedures: Your physician has requested that you have an LIMITED echocardiogram. Echocardiography is a painless test that uses sound waves to create images of your heart. It provides your doctor with information about the size and shape of your heart and how well your heart's chambers and valves are working. This procedure takes approximately one hour. There are no restrictions for this procedure.    Follow-Up: Your physician recommends that you schedule a follow-up appointment in: 3 MONTHS WITH DR END.   If you need a refill on your cardiac medications before your next appointment, please call your pharmacy.   Echocardiogram An echocardiogram, or echocardiography, uses sound waves (ultrasound) to produce an image of your heart. The echocardiogram is simple, painless, obtained within a short period of time, and offers valuable information to your health care provider. The images from an echocardiogram can provide information such as:  Evidence of coronary artery disease (CAD).  Heart size.  Heart muscle function.  Heart valve function.  Aneurysm detection.  Evidence of a past heart attack.  Fluid buildup around the heart.  Heart muscle thickening.  Assess heart valve function.  Tell a health care provider about:  Any allergies you have.  All medicines you are taking, including vitamins, herbs, eye drops, creams, and over-the-counter medicines.  Any problems you or family members have had with anesthetic medicines.  Any blood disorders you have.  Any surgeries you have had.  Any medical conditions you have.  Whether you are pregnant or may be pregnant. What happens before the procedure? No special preparation is needed. Eat and drink normally. What happens during  the procedure?  In order to produce an image of your heart, gel will be applied to your chest and a wand-like tool (transducer) will be moved over your chest. The gel will help transmit the sound waves from the transducer. The sound waves will harmlessly bounce off your heart to allow the heart images to be captured in real-time motion. These images will then be recorded.  You may need an IV to receive a medicine that improves the quality of the pictures. What happens after the procedure? You may return to your normal schedule including diet, activities, and medicines, unless your health care provider tells you otherwise. This information is not intended to replace advice given to you by your health care provider. Make sure you discuss any questions you have with your health care provider. Document Released: 07/27/2000 Document Revised: 03/17/2016 Document Reviewed: 04/06/2013 Elsevier Interactive Patient Education  2017 [employer].
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
## 2017-05-15 ENCOUNTER — Inpatient Hospital Stay (HOSPITAL_BASED_OUTPATIENT_CLINIC_OR_DEPARTMENT_OTHER): Payer: 59 | Admitting: Oncology

## 2017-05-15 ENCOUNTER — Inpatient Hospital Stay: Payer: 59 | Attending: Oncology

## 2017-05-15 ENCOUNTER — Inpatient Hospital Stay: Payer: 59

## 2017-05-15 VITALS — BP 104/73 | HR 88 | Temp 98.5°F | Wt 214.4 lb

## 2017-05-15 DIAGNOSIS — Z79899 Other long term (current) drug therapy: Secondary | ICD-10-CM | POA: Diagnosis not present

## 2017-05-15 DIAGNOSIS — F419 Anxiety disorder, unspecified: Secondary | ICD-10-CM | POA: Diagnosis not present

## 2017-05-15 DIAGNOSIS — Z5111 Encounter for antineoplastic chemotherapy: Secondary | ICD-10-CM | POA: Diagnosis not present

## 2017-05-15 DIAGNOSIS — C50411 Malignant neoplasm of upper-outer quadrant of right female breast: Secondary | ICD-10-CM

## 2017-05-15 DIAGNOSIS — I2510 Atherosclerotic heart disease of native coronary artery without angina pectoris: Secondary | ICD-10-CM | POA: Insufficient documentation

## 2017-05-15 DIAGNOSIS — Z923 Personal history of irradiation: Secondary | ICD-10-CM | POA: Diagnosis not present

## 2017-05-15 DIAGNOSIS — C7931 Secondary malignant neoplasm of brain: Secondary | ICD-10-CM | POA: Insufficient documentation

## 2017-05-15 DIAGNOSIS — C7989 Secondary malignant neoplasm of other specified sites: Secondary | ICD-10-CM

## 2017-05-15 DIAGNOSIS — J45909 Unspecified asthma, uncomplicated: Secondary | ICD-10-CM | POA: Insufficient documentation

## 2017-05-15 DIAGNOSIS — R000 Tachycardia, unspecified: Secondary | ICD-10-CM | POA: Insufficient documentation

## 2017-05-15 DIAGNOSIS — Z170 Estrogen receptor positive status [ER+]: Secondary | ICD-10-CM

## 2017-05-15 DIAGNOSIS — R5383 Other fatigue: Secondary | ICD-10-CM | POA: Insufficient documentation

## 2017-05-15 DIAGNOSIS — Z5112 Encounter for antineoplastic immunotherapy: Secondary | ICD-10-CM | POA: Insufficient documentation

## 2017-05-15 DIAGNOSIS — R21 Rash and other nonspecific skin eruption: Secondary | ICD-10-CM | POA: Diagnosis not present

## 2017-05-15 DIAGNOSIS — C778 Secondary and unspecified malignant neoplasm of lymph nodes of multiple regions: Secondary | ICD-10-CM | POA: Diagnosis not present

## 2017-05-15 DIAGNOSIS — F329 Major depressive disorder, single episode, unspecified: Secondary | ICD-10-CM

## 2017-05-15 DIAGNOSIS — R531 Weakness: Secondary | ICD-10-CM | POA: Diagnosis not present

## 2017-05-15 DIAGNOSIS — E119 Type 2 diabetes mellitus without complications: Secondary | ICD-10-CM | POA: Insufficient documentation

## 2017-05-15 DIAGNOSIS — Z9012 Acquired absence of left breast and nipple: Secondary | ICD-10-CM | POA: Insufficient documentation

## 2017-05-15 DIAGNOSIS — E039 Hypothyroidism, unspecified: Secondary | ICD-10-CM

## 2017-05-15 MED ORDER — DIPHENHYDRAMINE HCL 25 MG PO CAPS: 25.0000 mg | ORAL_CAPSULE | Freq: Once | ORAL | Status: DC

## 2017-05-15 MED ORDER — HEPARIN SOD (PORK) LOCK FLUSH 100 UNIT/ML IV SOLN
500.0000 [IU] | Freq: Once | INTRAVENOUS | Status: AC
  Administered 2017-05-15: 500 [IU] via INTRAVENOUS

## 2017-05-15 MED ORDER — PACLITAXEL CHEMO INJECTION 300 MG/50ML
80.0000 mg/m2 | Freq: Once | INTRAVENOUS | Status: AC
  Administered 2017-05-15: 168 mg via INTRAVENOUS
  Filled 2017-05-15: qty 28

## 2017-05-15 MED ORDER — DEXAMETHASONE SODIUM PHOSPHATE 10 MG/ML IJ SOLN
10.0000 mg | Freq: Once | INTRAMUSCULAR | Status: AC
  Administered 2017-05-15: 10 mg via INTRAVENOUS
  Filled 2017-05-15: qty 1

## 2017-05-15 MED ORDER — HEPARIN SOD (PORK) LOCK FLUSH 100 UNIT/ML IV SOLN
INTRAVENOUS | Status: AC
  Filled 2017-05-15: qty 5

## 2017-05-15 MED ORDER — TRASTUZUMAB CHEMO 150 MG IV SOLR
4.0000 mg/kg | Freq: Once | INTRAVENOUS | Status: AC
  Administered 2017-05-15: 399 mg via INTRAVENOUS
  Filled 2017-05-15: qty 19

## 2017-05-15 MED ORDER — FAMOTIDINE IN NACL 20-0.9 MG/50ML-% IV SOLN
20.0000 mg | Freq: Once | INTRAVENOUS | Status: AC
  Administered 2017-05-15: 20 mg via INTRAVENOUS
  Filled 2017-05-15: qty 50

## 2017-05-15 MED ORDER — ACETAMINOPHEN 325 MG PO TABS
650.0000 mg | ORAL_TABLET | Freq: Once | ORAL | Status: AC
  Administered 2017-05-15: 650 mg via ORAL
  Filled 2017-05-15: qty 2

## 2017-05-15 MED ORDER — SODIUM CHLORIDE 0.9 % IV SOLN
Freq: Once | INTRAVENOUS | Status: AC
  Administered 2017-05-15: 11:00:00 via INTRAVENOUS
  Filled 2017-05-15: qty 1000

## 2017-05-15 MED ORDER — DIPHENHYDRAMINE HCL 50 MG/ML IJ SOLN
25.0000 mg | Freq: Once | INTRAMUSCULAR | Status: AC
  Administered 2017-05-15: 25 mg via INTRAVENOUS
  Filled 2017-05-15: qty 1

## 2017-05-15 NOTE — Progress Notes (Signed)
Patient started on day 1 Herceptin today 4mg /kg for weekly doses.  Patient should get 2mg /kg for remainder cycles.  The cycles left have mixture of 4mg /kg and 2mg /kg doses ordered.  Notified MD to change treatment plan.

## 2017-05-16 DIAGNOSIS — M810 Age-related osteoporosis without current pathological fracture: Secondary | ICD-10-CM | POA: Diagnosis not present

## 2017-05-16 DIAGNOSIS — E034 Atrophy of thyroid (acquired): Secondary | ICD-10-CM | POA: Diagnosis not present

## 2017-05-16 DIAGNOSIS — R3129 Other microscopic hematuria: Secondary | ICD-10-CM | POA: Diagnosis not present

## 2017-05-18 ENCOUNTER — Encounter: Payer: Self-pay | Admitting: Oncology

## 2017-05-19 NOTE — Progress Notes (Signed)
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   INTERVAL HISTORY: Patient returns to clinic today for further evaluation and consideration of cycle 2, day 8 of Taxol and Herceptin. She developed a rash after her infusion last week, possibly related to Herceptin but the etiology remains unclear. Her rash has improved with steroids but she is not back to baseline. She is otherwise tolerating her treatments well without significant side effects. She continues to be anxious. She has no further shortness of breath.  She has no neurologic complaints. She denies any recent fevers or illnesses. She has a good appetite and has maintained her weight.  She has no chest pain, cough, or hemoptysis.  She has no nausea, vomiting, constipation, or diarrhea.  She has no urinary complaints. Patient offers no specific complaints today.  REVIEW OF SYSTEMS:   Review of Systems  Constitutional: Negative.  Negative for fever, malaise/fatigue and weight loss.  Respiratory: Negative.  Negative for cough and shortness of breath.   Cardiovascular: Negative.  Negative for chest pain and leg swelling.  Gastrointestinal: Negative.  Negative for abdominal pain.  Genitourinary: Negative.   Musculoskeletal: Negative.   Skin: Positive for itching and rash.  Neurological: Negative.  Negative for sensory change and weakness.  Psychiatric/Behavioral: The patient is nervous/anxious.    As per HPI. Otherwise, a complete review of systems is negative.   PAST MEDICAL HISTORY:   Hypothyroidism, migraines, depression, asthma.  PAST SURGICAL HISTORY: Bilateral mastectomy with reconstruction, partial hysterectomy.  FAMILY HISTORY: Lung cancer, melanoma, stomach cancer.  Also diabetes, CAD, hypertension     ADVANCED DIRECTIVES:    HEALTH MAINTENANCE: Social History  Substance Use Topics  . Smoking status: Never Smoker  . Smokeless tobacco: Never Used  . Alcohol use No     Colonoscopy:  PAP:  Bone density:  Lipid panel:  Allergies  Allergen Reactions  . Codeine Other (See Comments)    constipation  . Sulfa Antibiotics Other (See Comments)    Allergy as a child    Current Outpatient Prescriptions  Medication Sig Dispense Refill  . alendronate (FOSAMAX) 70 MG tablet Take 70 mg by mouth once a week. Take with a full glass of water on an empty stomach.     . ALPRAZolam (XANAX) 0.25 MG tablet Take 1 tablet by mouth 3 (three) times daily as needed for anxiety.     Marland Kitchen azelastine (ASTELIN) 0.1 % nasal spray Place 1 spray into both nostrils daily as needed for rhinitis. Use in each nostril as directed    . calcium-vitamin D (OSCAL WITH D) 250-125 MG-UNIT tablet Take 1 tablet by mouth daily.    . Cholecalciferol (D3 HIGH POTENCY) 2000 units CAPS Take 1 capsule by mouth daily.    . DULoxetine (CYMBALTA) 30 MG capsule Take 90 mg by mouth every morning.     . fluticasone (FLOVENT HFA) 110 MCG/ACT inhaler Inhale 1 puff into the lungs 2 (two) times daily.    Marland Kitchen levothyroxine (SYNTHROID, LEVOTHROID) 137 MCG tablet Take 137 mcg by mouth daily before breakfast.     . lidocaine-prilocaine (EMLA) cream Apply to  affected area once 30 g 3  . metoprolol succinate (TOPROL-XL) 25 MG 24 hr tablet Take 75 mg by mouth at bedtime.     . Multiple Vitamin (MULTIVITAMIN) tablet Take 1 tablet by mouth daily.    . ondansetron (ZOFRAN) 8 MG tablet Take 1 tablet (8 mg total) by mouth 2 (two) times daily as needed (Nausea or vomiting). 60 tablet 3  . predniSONE (STERAPRED UNI-PAK 21 TAB)  10 MG (21) TBPK tablet Take prescribed, 21 tablet 0  . prochlorperazine (COMPAZINE) 10 MG tablet Take 1 tablet (10 mg total) by mouth every 6 (six) hours as needed (Nausea or vomiting). 60 tablet 3  . topiramate (TOPAMAX) 50 MG tablet Take 50 mg by mouth at bedtime.     . cyclobenzaprine (FLEXERIL) 10 MG tablet Take 10 mg by mouth 3 (three) times daily as needed.     Marland Kitchen dexamethasone (DECADRON) 4 MG tablet Take 1 tablet (4 mg total) by mouth daily. (Patient not taking: Reported on 05/22/2017) 10 tablet 0  . traMADol (ULTRAM) 50 MG tablet Take 25-50 mg by mouth every 6 (six) hours as needed.     . Triamcinolone Acetonide (TRIAMCINOLONE 0.1 % CREAM : EUCERIN) CREA Apply 1 application topically 2 (two) times daily as needed. 1 each 0   No current facility-administered medications for this visit.     OBJECTIVE: Vitals:   05/22/17 0909  BP: 119/80  Pulse: 74  Resp: 18  Temp: 98.9 F (37.2 C)     Body mass index is 38.01 kg/m.    ECOG FS:0 - Asymptomatic  General: Well-developed, well-nourished, no acute distress. Eyes: anicteric sclera. Breasts: Patient has bilateral mastectomies with reconstruction, no obvious evidence of local recurrence. Lungs: Clear to auscultation bilaterally. Port in place in the right chest wall. Heart: Regular rate and rhythm. No rubs, murmurs, or gallops. Abdomen: Soft, nontender, nondistended. No organomegaly noted, normoactive bowel sounds. Musculoskeletal: No edema, cyanosis, or clubbing. Neuro: Alert, answering all questions appropriately. Cranial nerves grossly intact. Skin: Mild erythema noted on scalp and torso. Psych: Normal affect.   LAB RESULTS:  Lab Results  Component Value Date   NA 138 04/17/2017   K 3.5 04/17/2017   CL 111 04/17/2017   CO2 21 (L) 04/17/2017   GLUCOSE 132 (H) 04/17/2017   BUN 14 04/17/2017   CREATININE 0.89 04/17/2017   CALCIUM 9.3 04/17/2017   PROT 7.2 04/17/2017   ALBUMIN 4.0 04/17/2017   AST 36 04/17/2017   ALT 32  04/17/2017   ALKPHOS 64 04/17/2017   BILITOT 0.5 04/17/2017   GFRNONAA >60 04/17/2017   GFRAA >60 04/17/2017    Lab Results  Component Value Date   WBC 5.4 04/17/2017   NEUTROABS 3.5 04/17/2017   HGB 13.9 04/17/2017   HCT 40.1 04/17/2017   MCV 93.0 04/17/2017   PLT 244 04/17/2017     STUDIES: Nm Cardiac Muga Rest  Result Date: 04/30/2017 CLINICAL DATA:  Breast cancer. Patient received cardiotoxic chemotherapy. EXAM: NUCLEAR MEDICINE CARDIAC BLOOD POOL IMAGING (MUGA) TECHNIQUE: Cardiac multi-gated acquisition was performed at rest following intravenous injection of Tc-1mlabeled red blood cells. RADIOPHARMACEUTICALS:  21.07 mCi Tc-943mDP in-vitro labeled red blood cells IV COMPARISON:  None FINDINGS: The left ventricular ejection fraction was calculated equal 64.4% with a heart rate of approximately 79 beats per minute. Normal left ventricular wall motion. IMPRESSION: 1. Normal left ventricular ejection fraction equal to 64.4%. Electronically Signed   By: TaKerby Moors.D.   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 Patient found to have extensive lymphatic disease as well as brain metastasis. Patient's CA-27-29 is now mildly elevated at 65.6.  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 Patient's laboratory work is adequate to proceed  with cycle 2, day 8 of Taxol and Herceptin. Will give treatments on days 1, 8, and 15 with day 22 off. Plan to reimage at the conclusion of cycle 3.  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 2. Brain metastasis: Patient has completed XRT. Repeat brain imaging in January 2019.  3. 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 Continue follow-up with cardiology as indicated. 4. Rash: Possibly secondary to Herceptin, although this would be unusual since patient has had Herceptin in the past without issue. Continue steroids and topical screen as prescribed. Proceed cautiously with Herceptin as above.  Patient expressed understanding and was in agreement with this plan. She also understands that She can call clinic at any time with any questions, concerns, or complaints.   Approximately 30 minutes was spent in discussion of which greater than 50% was consultation.   Breast cancer   Staging form: Breast, AJCC 7th Edition     Clinical stage from 02/14/2015: Stage IA (T1c, N0, M0) - Signed by Lloyd Huger, MD on 02/14/2015   Lloyd Huger, MD   05/24/2017 4:38 PM

## 2017-05-20 ENCOUNTER — Inpatient Hospital Stay (HOSPITAL_BASED_OUTPATIENT_CLINIC_OR_DEPARTMENT_OTHER): Payer: 59 | Admitting: Oncology

## 2017-05-20 ENCOUNTER — Telehealth: Payer: Self-pay | Admitting: *Deleted

## 2017-05-20 VITALS — BP 110/74 | HR 114 | Temp 98.5°F | Resp 18 | Wt 215.5 lb

## 2017-05-20 DIAGNOSIS — C778 Secondary and unspecified malignant neoplasm of lymph nodes of multiple regions: Secondary | ICD-10-CM | POA: Diagnosis not present

## 2017-05-20 DIAGNOSIS — F419 Anxiety disorder, unspecified: Secondary | ICD-10-CM

## 2017-05-20 DIAGNOSIS — R21 Rash and other nonspecific skin eruption: Secondary | ICD-10-CM

## 2017-05-20 DIAGNOSIS — R5383 Other fatigue: Secondary | ICD-10-CM | POA: Diagnosis not present

## 2017-05-20 DIAGNOSIS — R000 Tachycardia, unspecified: Secondary | ICD-10-CM | POA: Diagnosis not present

## 2017-05-20 DIAGNOSIS — C50411 Malignant neoplasm of upper-outer quadrant of right female breast: Secondary | ICD-10-CM

## 2017-05-20 DIAGNOSIS — Z170 Estrogen receptor positive status [ER+]: Secondary | ICD-10-CM

## 2017-05-20 DIAGNOSIS — C7989 Secondary malignant neoplasm of other specified sites: Secondary | ICD-10-CM | POA: Diagnosis not present

## 2017-05-20 DIAGNOSIS — R531 Weakness: Secondary | ICD-10-CM

## 2017-05-20 DIAGNOSIS — E119 Type 2 diabetes mellitus without complications: Secondary | ICD-10-CM

## 2017-05-20 DIAGNOSIS — F329 Major depressive disorder, single episode, unspecified: Secondary | ICD-10-CM

## 2017-05-20 DIAGNOSIS — C7931 Secondary malignant neoplasm of brain: Secondary | ICD-10-CM | POA: Diagnosis not present

## 2017-05-20 DIAGNOSIS — I2510 Atherosclerotic heart disease of native coronary artery without angina pectoris: Secondary | ICD-10-CM

## 2017-05-20 DIAGNOSIS — Z9012 Acquired absence of left breast and nipple: Secondary | ICD-10-CM

## 2017-05-20 DIAGNOSIS — E039 Hypothyroidism, unspecified: Secondary | ICD-10-CM

## 2017-05-20 DIAGNOSIS — Z79899 Other long term (current) drug therapy: Secondary | ICD-10-CM

## 2017-05-20 DIAGNOSIS — Z923 Personal history of irradiation: Secondary | ICD-10-CM

## 2017-05-20 DIAGNOSIS — J45909 Unspecified asthma, uncomplicated: Secondary | ICD-10-CM

## 2017-05-20 MED ORDER — PREDNISONE 10 MG (21) PO TBPK: ORAL_TABLET | ORAL | 0 refills | Status: DC

## 2017-05-20 MED ORDER — TRIAMCINOLONE ACETONIDE 0.5 % EX OINT: 1.0000 "application " | TOPICAL_OINTMENT | Freq: Two times a day (BID) | CUTANEOUS | 0 refills | Status: DC
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
## 2017-05-20 NOTE — Telephone Encounter (Signed)
Patient called to report that she had her first Herceptin treatment Wed and she has broken out with a rash on her head, not below her neck, it is worse on her forehead, but her scalp and ears are also itching. She has tried creams, benadryl and her husbands Hydroxyzine, but niothing is making it better. The Hydroxyzine gave her relief for a short period, but not long. Please advise.  Per Dr Grayland Ormond, have her see Lorretta Harp, NP today. I left message on patient voice mail to return my call regarding an appointment at 130

## 2017-05-20 NOTE — Progress Notes (Signed)
Patient here today as acute add on for symptom management clinic. Patient reports rash that started after receiving Herceptin, rash is on head,face and ears. Patient reports rash itches, has tried Benadryl and Hydroxyzine.

## 2017-05-21 ENCOUNTER — Telehealth: Payer: Self-pay | Admitting: *Deleted

## 2017-05-21 ENCOUNTER — Other Ambulatory Visit: Payer: Self-pay | Admitting: Oncology

## 2017-05-21 MED ORDER — TRIAMCINOLONE ACETONIDE 0.5 % EX OINT: 1.0000 "application " | TOPICAL_OINTMENT | Freq: Two times a day (BID) | CUTANEOUS | 0 refills | Status: DC

## 2017-05-21 NOTE — Telephone Encounter (Signed)
That pharmacy did not receive prescription for kenalog. It got sent to mail order, so I resubmitted to [facility]

## 2017-05-22 ENCOUNTER — Other Ambulatory Visit: Payer: 59

## 2017-05-22 ENCOUNTER — Other Ambulatory Visit: Payer: Self-pay | Admitting: *Deleted

## 2017-05-22 ENCOUNTER — Inpatient Hospital Stay: Payer: 59

## 2017-05-22 ENCOUNTER — Inpatient Hospital Stay (HOSPITAL_BASED_OUTPATIENT_CLINIC_OR_DEPARTMENT_OTHER): Payer: 59 | Admitting: Oncology

## 2017-05-22 VITALS — BP 119/80 | HR 74 | Temp 98.9°F | Resp 18 | Wt 214.6 lb

## 2017-05-22 DIAGNOSIS — Z170 Estrogen receptor positive status [ER+]: Secondary | ICD-10-CM | POA: Diagnosis not present

## 2017-05-22 DIAGNOSIS — C7931 Secondary malignant neoplasm of brain: Secondary | ICD-10-CM | POA: Diagnosis not present

## 2017-05-22 DIAGNOSIS — R531 Weakness: Secondary | ICD-10-CM

## 2017-05-22 DIAGNOSIS — C778 Secondary and unspecified malignant neoplasm of lymph nodes of multiple regions: Secondary | ICD-10-CM

## 2017-05-22 DIAGNOSIS — C50411 Malignant neoplasm of upper-outer quadrant of right female breast: Secondary | ICD-10-CM

## 2017-05-22 DIAGNOSIS — E039 Hypothyroidism, unspecified: Secondary | ICD-10-CM

## 2017-05-22 DIAGNOSIS — F329 Major depressive disorder, single episode, unspecified: Secondary | ICD-10-CM

## 2017-05-22 DIAGNOSIS — R21 Rash and other nonspecific skin eruption: Secondary | ICD-10-CM

## 2017-05-22 DIAGNOSIS — C7989 Secondary malignant neoplasm of other specified sites: Secondary | ICD-10-CM

## 2017-05-22 DIAGNOSIS — R5383 Other fatigue: Secondary | ICD-10-CM

## 2017-05-22 DIAGNOSIS — F419 Anxiety disorder, unspecified: Secondary | ICD-10-CM

## 2017-05-22 DIAGNOSIS — I2510 Atherosclerotic heart disease of native coronary artery without angina pectoris: Secondary | ICD-10-CM

## 2017-05-22 DIAGNOSIS — Z79899 Other long term (current) drug therapy: Secondary | ICD-10-CM

## 2017-05-22 DIAGNOSIS — R000 Tachycardia, unspecified: Secondary | ICD-10-CM | POA: Diagnosis not present

## 2017-05-22 DIAGNOSIS — E119 Type 2 diabetes mellitus without complications: Secondary | ICD-10-CM

## 2017-05-22 DIAGNOSIS — Z9012 Acquired absence of left breast and nipple: Secondary | ICD-10-CM

## 2017-05-22 DIAGNOSIS — Z923 Personal history of irradiation: Secondary | ICD-10-CM

## 2017-05-22 DIAGNOSIS — J45909 Unspecified asthma, uncomplicated: Secondary | ICD-10-CM

## 2017-05-22 MED ORDER — FAMOTIDINE IN NACL 20-0.9 MG/50ML-% IV SOLN
20.0000 mg | Freq: Once | INTRAVENOUS | Status: AC
  Administered 2017-05-22: 20 mg via INTRAVENOUS
  Filled 2017-05-22: qty 50

## 2017-05-22 MED ORDER — SODIUM CHLORIDE 0.9 % IV SOLN
Freq: Once | INTRAVENOUS | Status: AC
  Administered 2017-05-22: 10:00:00 via INTRAVENOUS
  Filled 2017-05-22: qty 1000

## 2017-05-22 MED ORDER — SODIUM CHLORIDE 0.9 % IV SOLN
2.0000 mg/kg | Freq: Once | INTRAVENOUS | Status: AC
  Administered 2017-05-22: 189 mg via INTRAVENOUS
  Filled 2017-05-22: qty 9

## 2017-05-22 MED ORDER — DEXTROSE 5 % IV SOLN
80.0000 mg/m2 | Freq: Once | INTRAVENOUS | Status: AC
  Administered 2017-05-22: 168 mg via INTRAVENOUS
  Filled 2017-05-22: qty 28

## 2017-05-22 MED ORDER — DIPHENHYDRAMINE HCL 50 MG/ML IJ SOLN
25.0000 mg | Freq: Once | INTRAMUSCULAR | Status: AC
  Administered 2017-05-22: 25 mg via INTRAVENOUS
  Filled 2017-05-22: qty 1

## 2017-05-22 MED ORDER — DEXAMETHASONE SODIUM PHOSPHATE 10 MG/ML IJ SOLN
10.0000 mg | Freq: Once | INTRAMUSCULAR | Status: AC
  Administered 2017-05-22: 10 mg via INTRAVENOUS
  Filled 2017-05-22: qty 1

## 2017-05-22 MED ORDER — TRIAMCINOLONE 0.1 % CREAM:EUCERIN CREAM 1:1: 1.0000 "application " | TOPICAL_CREAM | Freq: Two times a day (BID) | CUTANEOUS | 0 refills | Status: DC | PRN

## 2017-05-22 MED ORDER — ACETAMINOPHEN 325 MG PO TABS
650.0000 mg | ORAL_TABLET | Freq: Once | ORAL | Status: AC
  Administered 2017-05-22: 650 mg via ORAL
  Filled 2017-05-22: qty 2

## 2017-05-22 MED ORDER — DIPHENHYDRAMINE HCL 25 MG PO CAPS: 25.0000 mg | ORAL_CAPSULE | Freq: Once | ORAL | Status: DC

## 2017-05-22 MED ORDER — HEPARIN SOD (PORK) LOCK FLUSH 100 UNIT/ML IV SOLN
500.0000 [IU] | Freq: Once | INTRAVENOUS | Status: AC | PRN
  Administered 2017-05-22: 500 [IU]
  Filled 2017-05-22: qty 5

## 2017-05-22 NOTE — Addendum Note (Signed)
Addended by: Betti Cruz on: 05/22/2017 04:20 PM   Modules accepted: Orders

## 2017-05-22 NOTE — Progress Notes (Signed)
Here for follow up asking for Kenalog ung vs cream to be ordered.

## 2017-05-23 ENCOUNTER — Ambulatory Visit (INDEPENDENT_AMBULATORY_CARE_PROVIDER_SITE_OTHER): Payer: 59

## 2017-05-23 ENCOUNTER — Other Ambulatory Visit: Payer: Self-pay

## 2017-05-23 DIAGNOSIS — I318 Other specified diseases of pericardium: Secondary | ICD-10-CM

## 2017-05-23 DIAGNOSIS — C801 Malignant (primary) neoplasm, unspecified: Secondary | ICD-10-CM | POA: Diagnosis not present

## 2017-05-23 DIAGNOSIS — I3131 Malignant pericardial effusion in diseases classified elsewhere: Secondary | ICD-10-CM

## 2017-05-23 DIAGNOSIS — I313 Pericardial effusion (noninflammatory): Principal | ICD-10-CM

## 2017-05-27 DIAGNOSIS — C50411 Malignant neoplasm of upper-outer quadrant of right female breast: Secondary | ICD-10-CM | POA: Diagnosis not present

## 2017-05-27 NOTE — Progress Notes (Signed)
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   INTERVAL HISTORY: Patient returns to clinic today for further evaluation and consideration of cycle 2, day 15 of Taxol and Herceptin. She continues to have a pruritic rash which is isolated to her scalp. It did not become worse this week after treatment last week. Patient feels it is slightly better. She is otherwise tolerating her treatments well without significant side effects. She continues to be anxious. She has no further shortness of breath.  She has no neurologic complaints. She denies any recent fevers or illnesses. She has a good appetite and has maintained her weight.  She has no chest pain, cough, or hemoptysis.  She has no nausea, vomiting, constipation, or diarrhea.  She has no urinary complaints. Patient offers no further specific complaints today.  REVIEW OF SYSTEMS:   Review of Systems  Constitutional: Negative.  Negative for fever, malaise/fatigue and weight loss.  Respiratory: Negative.  Negative for cough and shortness of breath.   Cardiovascular: Negative.  Negative for chest pain and leg swelling.  Gastrointestinal: Negative.  Negative for abdominal pain.  Genitourinary: Negative.   Musculoskeletal: Negative.   Skin: Positive for itching and rash.  Neurological: Negative.  Negative for sensory change and weakness.  Psychiatric/Behavioral: The patient is nervous/anxious.    As per HPI. Otherwise, a complete review of systems is negative.   PAST MEDICAL HISTORY:  Hypothyroidism,  migraines, depression, asthma.  PAST SURGICAL HISTORY: Bilateral mastectomy with reconstruction, partial hysterectomy.  FAMILY HISTORY: Lung cancer, melanoma, stomach cancer.  Also diabetes, CAD, hypertension     ADVANCED DIRECTIVES:    HEALTH MAINTENANCE: Social History  Substance Use Topics  . Smoking status: Never Smoker  . Smokeless tobacco: Never Used  . Alcohol use No     Colonoscopy:  PAP:  Bone density:  Lipid panel:  Allergies  Allergen Reactions  . Codeine Other (See Comments)    constipation  . Sulfa Antibiotics Other (See Comments)    Allergy as a child    Current Outpatient Prescriptions  Medication Sig Dispense Refill  . alendronate (FOSAMAX) 70 MG tablet Take 70 mg by mouth once a week. Take with a full glass of water on an empty stomach.     . ALPRAZolam (XANAX) 0.25 MG tablet Take 1 tablet by mouth 3 (three) times daily as needed for anxiety.     Marland Kitchen azelastine (ASTELIN) 0.1 % nasal spray Place 1 spray into both nostrils daily as needed for rhinitis. Use in each nostril as directed    . calcium-vitamin D (OSCAL WITH D) 250-125 MG-UNIT tablet Take 1 tablet by mouth daily.    . Cholecalciferol (D3 HIGH POTENCY) 2000 units CAPS Take 1 capsule by mouth daily.    . cyclobenzaprine (FLEXERIL) 10 MG tablet Take 10 mg by mouth 3 (three) times daily as needed.     Marland Kitchen dexamethasone (DECADRON) 4 MG tablet Take 1 tablet (4 mg total) by mouth daily. 10 tablet 0  . DULoxetine (CYMBALTA) 30 MG capsule Take 90 mg by mouth every morning.     . fluticasone (FLOVENT HFA) 110  MCG/ACT inhaler Inhale 1 puff into the lungs 2 (two) times daily.    Marland Kitchen levothyroxine (SYNTHROID, LEVOTHROID) 137 MCG tablet Take 137 mcg by mouth daily before breakfast.     . lidocaine-prilocaine (EMLA) cream Apply to affected area once 30 g 3  . metoprolol succinate (TOPROL-XL) 25 MG 24 hr tablet Take 75 mg by mouth at bedtime.     . Multiple Vitamin (MULTIVITAMIN) tablet Take 1 tablet by mouth daily.      . ondansetron (ZOFRAN) 8 MG tablet Take 1 tablet (8 mg total) by mouth 2 (two) times daily as needed (Nausea or vomiting). 60 tablet 3  . prochlorperazine (COMPAZINE) 10 MG tablet Take 1 tablet (10 mg total) by mouth every 6 (six) hours as needed (Nausea or vomiting). 60 tablet 3  . topiramate (TOPAMAX) 50 MG tablet Take 50 mg by mouth at bedtime.     . traMADol (ULTRAM) 50 MG tablet Take 25-50 mg by mouth every 6 (six) hours as needed.     . Triamcinolone Acetonide (TRIAMCINOLONE 0.1 % CREAM : EUCERIN) CREA Apply 1 application topically 2 (two) times daily as needed. 1 each 0   No current facility-administered medications for this visit.    Facility-Administered Medications Ordered in Other Visits  Medication Dose Route Frequency Provider Last Rate Last Dose  . heparin lock flush 100 unit/mL  500 Units Intracatheter Once PRN Lloyd Huger, MD      . PACLitaxel (TAXOL) 168 mg in dextrose 5 % 250 mL chemo infusion (</= 46m/m2)  80 mg/m2 (Treatment Plan Recorded) Intravenous Once FLloyd Huger MD      . trastuzumab (HERCEPTIN) 189 mg in sodium chloride 0.9 % 250 mL chemo infusion  2 mg/kg (Treatment Plan Recorded) Intravenous Once FLloyd Huger MD        OBJECTIVE: Vitals:   05/29/17 0902  BP: 117/81  Pulse: 91  Temp: 99.1 F (37.3 C)     Body mass index is 37.68 kg/m.    ECOG FS:0 - Asymptomatic  General: Well-developed, well-nourished, no acute distress. Eyes: anicteric sclera. Breasts: Patient has bilateral mastectomies with reconstruction, no obvious evidence of local recurrence. Lungs: Clear to auscultation bilaterally. Port in place in the right chest wall. Heart: Regular rate and rhythm. No rubs, murmurs, or gallops. Abdomen: Soft, nontender, nondistended. No organomegaly noted, normoactive bowel sounds. Musculoskeletal: No edema, cyanosis, or clubbing. Neuro: Alert, answering all questions appropriately. Cranial nerves grossly intact. Skin: Mild erythema  noted on scalp and torso. Psych: Normal affect.   LAB RESULTS:  Lab Results  Component Value Date   NA 138 04/17/2017   K 3.5 04/17/2017   CL 111 04/17/2017   CO2 21 (L) 04/17/2017   GLUCOSE 132 (H) 04/17/2017   BUN 14 04/17/2017   CREATININE 0.89 04/17/2017   CALCIUM 9.3 04/17/2017   PROT 7.2 04/17/2017   ALBUMIN 4.0 04/17/2017   AST 36 04/17/2017   ALT 32 04/17/2017   ALKPHOS 64 04/17/2017   BILITOT 0.5 04/17/2017   GFRNONAA >60 04/17/2017   GFRAA >60 04/17/2017    Lab Results  Component Value Date   WBC 5.4 04/17/2017   NEUTROABS 3.5 04/17/2017   HGB 13.9 04/17/2017   HCT 40.1 04/17/2017   MCV 93.0 04/17/2017   PLT 244 04/17/2017     STUDIES: Nm Cardiac Muga Rest  Result Date: 04/30/2017 CLINICAL DATA:  Breast cancer. Patient received cardiotoxic chemotherapy. EXAM: NUCLEAR MEDICINE CARDIAC BLOOD POOL IMAGING (MUGA) TECHNIQUE: Cardiac multi-gated acquisition was performed  at rest following intravenous injection of Tc-72mlabeled red blood cells. RADIOPHARMACEUTICALS:  21.07 mCi Tc-953mDP in-vitro labeled red blood cells IV COMPARISON:  None FINDINGS: The left ventricular ejection fraction was calculated equal 64.4% with a heart rate of approximately 79 beats per minute. Normal left ventricular wall motion. IMPRESSION: 1. Normal left ventricular ejection fraction equal to 64.4%. Electronically Signed   By: TaKerby Moors.D.   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 Patient found to have extensive lymphatic disease as well as brain metastasis. Patient's CA-27-29 is now mildly elevated at 65.6.  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 Patient's laboratory work is adequate to proceed with cycle 2, day 15 of Taxol and Herceptin. Will give treatments on days 1, 8, and 15 with day 22 off. Plan to reimage at the conclusion of cycle 3.  Of note, patient gets all of her laboratory work at [facility]. Brain metastasis: Patient has completed XRT. Repeat brain imaging in January 2019.  3. 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 Continue follow-up with cardiology as indicated. 4. Rash: Unclear etiology, possibly related to XRT. Isolated to her scalp and improving. Continue topical treatment.  Patient expressed understanding and was in agreement with this plan. She also understands that She can call clinic at any time with any questions, concerns, or complaints.   Approximately 30 minutes was spent in discussion of which greater than 50% was consultation.   Breast cancer   Staging form: Breast, AJCC 7th Edition     Clinical stage from 02/14/2015: Stage IA (T1c, N0, M0) - Signed by TiLloyd HugerMD on 02/14/2015   TiLloyd HugerMD   05/29/2017 11:04 AM

## 2017-05-28 ENCOUNTER — Encounter: Payer: Self-pay | Admitting: Oncology

## 2017-05-28 DIAGNOSIS — E7849 Other hyperlipidemia: Secondary | ICD-10-CM | POA: Diagnosis not present

## 2017-05-28 DIAGNOSIS — I471 Supraventricular tachycardia: Secondary | ICD-10-CM | POA: Diagnosis not present

## 2017-05-28 DIAGNOSIS — E034 Atrophy of thyroid (acquired): Secondary | ICD-10-CM | POA: Diagnosis not present

## 2017-05-29 ENCOUNTER — Encounter: Payer: Self-pay | Admitting: Oncology

## 2017-05-29 ENCOUNTER — Inpatient Hospital Stay: Payer: 59

## 2017-05-29 ENCOUNTER — Inpatient Hospital Stay (HOSPITAL_BASED_OUTPATIENT_CLINIC_OR_DEPARTMENT_OTHER): Payer: 59 | Admitting: Oncology

## 2017-05-29 VITALS — BP 117/81 | HR 91 | Temp 99.1°F | Wt 212.7 lb

## 2017-05-29 DIAGNOSIS — C7931 Secondary malignant neoplasm of brain: Secondary | ICD-10-CM

## 2017-05-29 DIAGNOSIS — R5383 Other fatigue: Secondary | ICD-10-CM | POA: Diagnosis not present

## 2017-05-29 DIAGNOSIS — R531 Weakness: Secondary | ICD-10-CM

## 2017-05-29 DIAGNOSIS — R000 Tachycardia, unspecified: Secondary | ICD-10-CM

## 2017-05-29 DIAGNOSIS — C778 Secondary and unspecified malignant neoplasm of lymph nodes of multiple regions: Secondary | ICD-10-CM | POA: Diagnosis not present

## 2017-05-29 DIAGNOSIS — E039 Hypothyroidism, unspecified: Secondary | ICD-10-CM

## 2017-05-29 DIAGNOSIS — Z9012 Acquired absence of left breast and nipple: Secondary | ICD-10-CM

## 2017-05-29 DIAGNOSIS — F329 Major depressive disorder, single episode, unspecified: Secondary | ICD-10-CM

## 2017-05-29 DIAGNOSIS — C50411 Malignant neoplasm of upper-outer quadrant of right female breast: Secondary | ICD-10-CM

## 2017-05-29 DIAGNOSIS — R21 Rash and other nonspecific skin eruption: Secondary | ICD-10-CM | POA: Diagnosis not present

## 2017-05-29 DIAGNOSIS — F419 Anxiety disorder, unspecified: Secondary | ICD-10-CM

## 2017-05-29 DIAGNOSIS — Z79899 Other long term (current) drug therapy: Secondary | ICD-10-CM

## 2017-05-29 DIAGNOSIS — Z170 Estrogen receptor positive status [ER+]: Secondary | ICD-10-CM

## 2017-05-29 DIAGNOSIS — J45909 Unspecified asthma, uncomplicated: Secondary | ICD-10-CM

## 2017-05-29 DIAGNOSIS — Z923 Personal history of irradiation: Secondary | ICD-10-CM

## 2017-05-29 DIAGNOSIS — C7989 Secondary malignant neoplasm of other specified sites: Secondary | ICD-10-CM | POA: Diagnosis not present

## 2017-05-29 DIAGNOSIS — I2510 Atherosclerotic heart disease of native coronary artery without angina pectoris: Secondary | ICD-10-CM

## 2017-05-29 DIAGNOSIS — E119 Type 2 diabetes mellitus without complications: Secondary | ICD-10-CM

## 2017-05-29 MED ORDER — FAMOTIDINE IN NACL 20-0.9 MG/50ML-% IV SOLN
20.0000 mg | Freq: Once | INTRAVENOUS | Status: AC
  Administered 2017-05-29: 20 mg via INTRAVENOUS
  Filled 2017-05-29: qty 50

## 2017-05-29 MED ORDER — TRASTUZUMAB CHEMO 150 MG IV SOLR
2.0000 mg/kg | Freq: Once | INTRAVENOUS | Status: AC
  Administered 2017-05-29: 189 mg via INTRAVENOUS
  Filled 2017-05-29: qty 9

## 2017-05-29 MED ORDER — PACLITAXEL CHEMO INJECTION 300 MG/50ML
80.0000 mg/m2 | Freq: Once | INTRAVENOUS | Status: AC
  Administered 2017-05-29: 168 mg via INTRAVENOUS
  Filled 2017-05-29: qty 28

## 2017-05-29 MED ORDER — DEXAMETHASONE SODIUM PHOSPHATE 10 MG/ML IJ SOLN
10.0000 mg | Freq: Once | INTRAMUSCULAR | Status: AC
  Administered 2017-05-29: 10 mg via INTRAVENOUS
  Filled 2017-05-29: qty 1

## 2017-05-29 MED ORDER — ACETAMINOPHEN 325 MG PO TABS
650.0000 mg | ORAL_TABLET | Freq: Once | ORAL | Status: AC
  Administered 2017-05-29: 650 mg via ORAL
  Filled 2017-05-29: qty 2

## 2017-05-29 MED ORDER — DIPHENHYDRAMINE HCL 25 MG PO CAPS: 25.0000 mg | ORAL_CAPSULE | Freq: Once | ORAL | Status: AC

## 2017-05-29 MED ORDER — SODIUM CHLORIDE 0.9 % IV SOLN
Freq: Once | INTRAVENOUS | Status: AC
  Administered 2017-05-29: 10:00:00 via INTRAVENOUS
  Filled 2017-05-29: qty 1000

## 2017-05-29 MED ORDER — DIPHENHYDRAMINE HCL 50 MG/ML IJ SOLN
25.0000 mg | Freq: Once | INTRAMUSCULAR | Status: AC
  Administered 2017-05-29: 25 mg via INTRAVENOUS
  Filled 2017-05-29: qty 1

## 2017-05-29 MED ORDER — HEPARIN SOD (PORK) LOCK FLUSH 100 UNIT/ML IV SOLN
500.0000 [IU] | Freq: Once | INTRAVENOUS | Status: AC | PRN
  Administered 2017-05-29: 500 [IU]
  Filled 2017-05-29: qty 5

## 2017-05-30 ENCOUNTER — Encounter: Payer: Self-pay | Admitting: Oncology

## 2017-06-04 ENCOUNTER — Encounter: Payer: Self-pay | Admitting: Oncology

## 2017-06-06 ENCOUNTER — Encounter: Payer: Self-pay | Admitting: Oncology

## 2017-06-07 ENCOUNTER — Encounter: Payer: Self-pay | Admitting: Oncology

## 2017-06-10 DIAGNOSIS — C50411 Malignant neoplasm of upper-outer quadrant of right female breast: Secondary | ICD-10-CM | POA: Diagnosis not present

## 2017-06-10 NOTE — Progress Notes (Signed)
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   INTERVAL HISTORY: Patient returns to clinic today for further evaluation and consideration of cycle 3, day 1 of Taxol and Herceptin. She continues to have a pruritic and erythematous rash which is isolated to her scalp for approximately 24-48 hours after her treatment which then resolved with topical cream. She is otherwise tolerating her treatments well without significant side effects. She has no further shortness of breath.  She has no neurologic complaints. She denies any recent fevers or illnesses. She has a good appetite and has maintained her weight.  She has no chest pain, cough, or hemoptysis.  She has no nausea, vomiting, constipation, or diarrhea.  She has no urinary complaints. Patient offers no further specific complaints today.  REVIEW OF SYSTEMS:   Review of Systems  Constitutional: Negative.  Negative for fever, malaise/fatigue and weight loss.  Respiratory: Negative.  Negative for cough and shortness of breath.   Cardiovascular: Negative.  Negative for chest pain and leg swelling.  Gastrointestinal: Negative.  Negative for abdominal pain.  Genitourinary: Negative.   Musculoskeletal: Negative.   Skin: Positive for itching and rash.  Neurological: Negative.  Negative for sensory change and weakness.  Psychiatric/Behavioral: The patient is nervous/anxious.    As per HPI. Otherwise, a complete review of systems is negative.   PAST MEDICAL HISTORY:  Hypothyroidism, migraines, depression,  asthma.  PAST SURGICAL HISTORY: Bilateral mastectomy with reconstruction, partial hysterectomy.  FAMILY HISTORY: Lung cancer, melanoma, stomach cancer.  Also diabetes, CAD, hypertension     ADVANCED DIRECTIVES:    HEALTH MAINTENANCE: Social History  Substance Use Topics  . Smoking status: Never Smoker  . Smokeless tobacco: Never Used  . Alcohol use No     Colonoscopy:  PAP:  Bone density:  Lipid panel:  Allergies  Allergen Reactions  . Codeine Other (See Comments)    constipation  . Sulfa Antibiotics Other (See Comments)    Allergy as a child    Current Outpatient Prescriptions  Medication Sig Dispense Refill  . alendronate (FOSAMAX) 70 MG tablet Take 70 mg by mouth once a week. Take with a full glass of water on an empty stomach.     . ALPRAZolam (XANAX) 0.25 MG tablet Take 1 tablet by mouth 3 (three) times daily as needed for anxiety.     Marland Kitchen azelastine (ASTELIN) 0.1 % nasal spray Place 1 spray into both nostrils daily as needed for rhinitis. Use in each nostril as directed    . calcium-vitamin D (OSCAL WITH D) 250-125 MG-UNIT tablet Take 1 tablet by mouth daily.    . Cholecalciferol (D3 HIGH POTENCY) 2000 units CAPS Take 1 capsule by mouth daily.    . cyclobenzaprine (FLEXERIL) 10 MG tablet Take 10 mg by mouth 3 (three) times daily as needed.     . DULoxetine (CYMBALTA) 30 MG capsule Take 90 mg by mouth every morning.     . fluticasone (FLOVENT HFA) 110 MCG/ACT inhaler Inhale 1 puff into the lungs 2 (two) times daily.    Marland Kitchen levothyroxine (SYNTHROID, LEVOTHROID) 137 MCG tablet Take 137 mcg by  mouth daily before breakfast.     . lidocaine-prilocaine (EMLA) cream Apply to affected area once 30 g 3  . metoprolol succinate (TOPROL-XL) 25 MG 24 hr tablet Take 75 mg by mouth at bedtime.     . Multiple Vitamin (MULTIVITAMIN) tablet Take 1 tablet by mouth daily.    . ondansetron (ZOFRAN) 8 MG tablet Take 1 tablet (8 mg total) by mouth 2 (two) times daily as needed (Nausea or  vomiting). 60 tablet 3  . prochlorperazine (COMPAZINE) 10 MG tablet Take 1 tablet (10 mg total) by mouth every 6 (six) hours as needed (Nausea or vomiting). 60 tablet 3  . topiramate (TOPAMAX) 50 MG tablet Take 50 mg by mouth at bedtime.     . traMADol (ULTRAM) 50 MG tablet Take 25-50 mg by mouth every 6 (six) hours as needed.     . Triamcinolone Acetonide (TRIAMCINOLONE 0.1 % CREAM : EUCERIN) CREA Apply 1 application topically 2 (two) times daily as needed. 1 each 0   No current facility-administered medications for this visit.     OBJECTIVE: There were no vitals filed for this visit.   There is no height or weight on file to calculate BMI.    ECOG FS:0 - Asymptomatic  General: Well-developed, well-nourished, no acute distress. Eyes: anicteric sclera. Breasts: Patient has bilateral mastectomies with reconstruction, no obvious evidence of local recurrence. Lungs: Clear to auscultation bilaterally. Port in place in the right chest wall. Heart: Regular rate and rhythm. No rubs, murmurs, or gallops. Abdomen: Soft, nontender, nondistended. No organomegaly noted, normoactive bowel sounds. Musculoskeletal: No edema, cyanosis, or clubbing. Neuro: Alert, answering all questions appropriately. Cranial nerves grossly intact. Skin: Mild erythema noted on scalp and torso. Psych: Normal affect.   LAB RESULTS:  Lab Results  Component Value Date   NA 138 04/17/2017   K 3.5 04/17/2017   CL 111 04/17/2017   CO2 21 (L) 04/17/2017   GLUCOSE 132 (H) 04/17/2017   BUN 14 04/17/2017   CREATININE 0.89 04/17/2017   CALCIUM 9.3 04/17/2017   PROT 7.2 04/17/2017   ALBUMIN 4.0 04/17/2017   AST 36 04/17/2017   ALT 32 04/17/2017   ALKPHOS 64 04/17/2017   BILITOT 0.5 04/17/2017   GFRNONAA >60 04/17/2017   GFRAA >60 04/17/2017    Lab Results  Component Value Date   WBC 5.4 04/17/2017   NEUTROABS 3.5 04/17/2017   HGB 13.9 04/17/2017   HCT 40.1 04/17/2017   MCV 93.0 04/17/2017   PLT 244 04/17/2017       STUDIES: No results found.  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 Patient found to have extensive lymphatic disease as well as brain metastasis. Patient's CA-27-29 is now mildly elevated at 65.6.  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 Patient's laboratory work is adequate to proceed with cycle 3, day 1 of Taxol and Herceptin. Will give treatments on days 1, 8, and 15 with day 22  off. Plan to reimage at the conclusion of cycle 3.  Return to clinic in 1 week for consideration of cycle 3, day 8.  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 2. Brain metastasis: Patient has completed XRT. Repeat brain imaging in ~January 2019.  3. 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 Continue follow-up with cardiology as indicated. 4. Rash: Unclear etiology, possibly related to XRT. Isolated to her scalp and improving. Continue topical treatment.  Increase premedication of dexamethasone to 20 mg IV with each treatment.  Patient expressed understanding and was in agreement with this plan. She also understands that She can call clinic at any time with any questions, concerns, or complaints.    Breast cancer   Staging form: Breast, AJCC 7th Edition     Clinical stage from 02/14/2015: Stage IA (T1c, N0, M0) - Signed by Lloyd Huger, MD on  02/14/2015   Lloyd Huger, MD   06/12/2017 9:49 AM

## 2017-06-12 ENCOUNTER — Inpatient Hospital Stay (HOSPITAL_BASED_OUTPATIENT_CLINIC_OR_DEPARTMENT_OTHER): Payer: 59 | Admitting: Oncology

## 2017-06-12 ENCOUNTER — Other Ambulatory Visit: Payer: 59

## 2017-06-12 ENCOUNTER — Inpatient Hospital Stay: Payer: 59

## 2017-06-12 ENCOUNTER — Ambulatory Visit
Admission: RE | Admit: 2017-06-12 | Discharge: 2017-06-12 | Disposition: A | Discharge status: Home / Self Care | Payer: 59 | Source: Ambulatory Visit | Attending: Radiation Oncology | Admitting: Radiation Oncology

## 2017-06-12 VITALS — BP 128/78 | HR 82 | Temp 97.7°F | Wt 214.0 lb

## 2017-06-12 VITALS — BP 115/75 | Temp 98.7°F | Resp 15 | Wt 211.0 lb

## 2017-06-12 DIAGNOSIS — E119 Type 2 diabetes mellitus without complications: Secondary | ICD-10-CM

## 2017-06-12 DIAGNOSIS — F419 Anxiety disorder, unspecified: Secondary | ICD-10-CM

## 2017-06-12 DIAGNOSIS — R5383 Other fatigue: Secondary | ICD-10-CM

## 2017-06-12 DIAGNOSIS — C50411 Malignant neoplasm of upper-outer quadrant of right female breast: Secondary | ICD-10-CM | POA: Diagnosis not present

## 2017-06-12 DIAGNOSIS — C7931 Secondary malignant neoplasm of brain: Secondary | ICD-10-CM

## 2017-06-12 DIAGNOSIS — C7989 Secondary malignant neoplasm of other specified sites: Secondary | ICD-10-CM | POA: Diagnosis not present

## 2017-06-12 DIAGNOSIS — Z9012 Acquired absence of left breast and nipple: Secondary | ICD-10-CM

## 2017-06-12 DIAGNOSIS — R531 Weakness: Secondary | ICD-10-CM | POA: Diagnosis not present

## 2017-06-12 DIAGNOSIS — C778 Secondary and unspecified malignant neoplasm of lymph nodes of multiple regions: Secondary | ICD-10-CM | POA: Diagnosis not present

## 2017-06-12 DIAGNOSIS — I2510 Atherosclerotic heart disease of native coronary artery without angina pectoris: Secondary | ICD-10-CM

## 2017-06-12 DIAGNOSIS — Z170 Estrogen receptor positive status [ER+]: Secondary | ICD-10-CM | POA: Diagnosis not present

## 2017-06-12 DIAGNOSIS — R000 Tachycardia, unspecified: Secondary | ICD-10-CM

## 2017-06-12 DIAGNOSIS — C50919 Malignant neoplasm of unspecified site of unspecified female breast: Secondary | ICD-10-CM | POA: Insufficient documentation

## 2017-06-12 DIAGNOSIS — R51 Headache: Secondary | ICD-10-CM | POA: Insufficient documentation

## 2017-06-12 DIAGNOSIS — Z923 Personal history of irradiation: Secondary | ICD-10-CM | POA: Insufficient documentation

## 2017-06-12 DIAGNOSIS — F329 Major depressive disorder, single episode, unspecified: Secondary | ICD-10-CM

## 2017-06-12 DIAGNOSIS — R21 Rash and other nonspecific skin eruption: Secondary | ICD-10-CM | POA: Diagnosis not present

## 2017-06-12 DIAGNOSIS — J45909 Unspecified asthma, uncomplicated: Secondary | ICD-10-CM

## 2017-06-12 DIAGNOSIS — Z79899 Other long term (current) drug therapy: Secondary | ICD-10-CM

## 2017-06-12 DIAGNOSIS — E039 Hypothyroidism, unspecified: Secondary | ICD-10-CM

## 2017-06-12 MED ORDER — FAMOTIDINE IN NACL 20-0.9 MG/50ML-% IV SOLN
20.0000 mg | Freq: Once | INTRAVENOUS | Status: AC
  Administered 2017-06-12: 20 mg via INTRAVENOUS
  Filled 2017-06-12: qty 50

## 2017-06-12 MED ORDER — SODIUM CHLORIDE 0.9% FLUSH
10.0000 mL | INTRAVENOUS | Status: DC | PRN
  Administered 2017-06-12: 10 mL
  Filled 2017-06-12: qty 10

## 2017-06-12 MED ORDER — TRASTUZUMAB CHEMO 150 MG IV SOLR
2.0000 mg/kg | Freq: Once | INTRAVENOUS | Status: AC
  Administered 2017-06-12: 189 mg via INTRAVENOUS
  Filled 2017-06-12: qty 9

## 2017-06-12 MED ORDER — PACLITAXEL CHEMO INJECTION 300 MG/50ML
80.0000 mg/m2 | Freq: Once | INTRAVENOUS | Status: AC
  Administered 2017-06-12: 168 mg via INTRAVENOUS
  Filled 2017-06-12: qty 28

## 2017-06-12 MED ORDER — ACETAMINOPHEN 325 MG PO TABS
650.0000 mg | ORAL_TABLET | Freq: Once | ORAL | Status: AC
  Administered 2017-06-12: 650 mg via ORAL
  Filled 2017-06-12: qty 2

## 2017-06-12 MED ORDER — DEXAMETHASONE SODIUM PHOSPHATE 10 MG/ML IJ SOLN: 20.0000 mg | Freq: Once | INTRAMUSCULAR | Status: DC

## 2017-06-12 MED ORDER — SODIUM CHLORIDE 0.9 % IV SOLN
Freq: Once | INTRAVENOUS | Status: AC
  Administered 2017-06-12: 10:00:00 via INTRAVENOUS
  Filled 2017-06-12: qty 1000

## 2017-06-12 MED ORDER — HEPARIN SOD (PORK) LOCK FLUSH 100 UNIT/ML IV SOLN
500.0000 [IU] | Freq: Once | INTRAVENOUS | Status: AC | PRN
  Administered 2017-06-12: 500 [IU]

## 2017-06-12 MED ORDER — DIPHENHYDRAMINE HCL 50 MG/ML IJ SOLN
25.0000 mg | Freq: Once | INTRAMUSCULAR | Status: AC
  Administered 2017-06-12: 25 mg via INTRAVENOUS
  Filled 2017-06-12: qty 1

## 2017-06-12 MED ORDER — SODIUM CHLORIDE 0.9 % IV SOLN
20.0000 mg | Freq: Once | INTRAVENOUS | Status: AC
  Administered 2017-06-12: 20 mg via INTRAVENOUS
  Filled 2017-06-12: qty 2

## 2017-06-12 NOTE — Progress Notes (Signed)
Patient here for follow up with labs and treatment with taxol and herceptin. She states that she is feeling fairly well today and denies having any pain.

## 2017-06-12 NOTE — Progress Notes (Signed)
Radiation Oncology Follow up Note  Name: Julie Irwin   Date:   06/12/2017 MRN:  [id number] DOB: 02-Oct-1955    This 61 y.o. female one-month follow-up status post salvage whole brain radiation therapy   REFERRING PROVIDER: Adin Hector, MD  HPI: Patient is a 61 year old female now seen out 1 month having completed whole brain radiation therapy. She recently had stage I disease ER/PR positive HER-2 positive now with recurrent disease with biopsy-proven ER/PR negative HER-2/neu positive metastatic disease with pericardial fluid and brain metastasis. Seen today in routine follow she is doing fairly well. She's currently on Taxol Herceptin although she's developed a pruritic rash isolated to her scalp and face which has resolved with topical cream. She's otherwise without complaint. She does have stuffiness in his ear her ears and I have suggested Claritin-D for that. She otherwise has mild intermittent headaches otherwise no change in neurologic status.  COMPLICATIONS OF TREATMENT: none  FOLLOW UP COMPLIANCE: keeps appointments   PHYSICAL EXAM:  BP 128/78   Pulse 82   Temp 97.7 F (36.5 C)   Wt 213 lb 15.3 oz (97 kg)   BMI 37.90 kg/m  Well-developed well-nourished patient in NAD. HEENT reveals PERLA, EOMI, discs not visualized.  Oral cavity is clear. No oral mucosal lesions are identified. Neck is clear without evidence of cervical or supraclavicular adenopathy. Lungs are clear to A&P. Cardiac examination is essentially unremarkable with regular rate and rhythm without murmur rub or thrill. Abdomen is benign with no organomegaly or masses noted. Motor sensory and DTR levels are equal and symmetric in the upper and lower extremities. Cranial nerves II through XII are grossly intact. Proprioception is intact. No peripheral adenopathy or edema is identified. No motor or sensory levels are noted. Crude visual fields are within normal range.  RADIOLOGY RESULTS: No current films for  review  PLAN: Present time she is doing well recovering nicely from her whole brain radiation therapy. She continues with Taxol Herceptin under medical oncology's direction. I will turn follow-up care over to medical oncology. I would be happy to reevaluate the patient any time should further palliative treatment be indicated.  I would like to take this opportunity to thank you for allowing me to participate in the care of your patient.Armstead Peaks., MD

## 2017-06-17 DIAGNOSIS — C50411 Malignant neoplasm of upper-outer quadrant of right female breast: Secondary | ICD-10-CM | POA: Diagnosis not present

## 2017-06-17 NOTE — Progress Notes (Signed)
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   INTERVAL HISTORY: Patient returns to clinic today for further evaluation and consideration of cycle 3, day 8 of Taxol and Herceptin. She continues to have a pruritic and erythematous rash which is isolated to her scalp for approximately 24-48 hours after her treatment which then resolved with topical cream.  Her rash was improved this past week after increasing her dexamethasone premedication.  She is otherwise tolerating her treatments well without significant side effects. She has no further shortness of breath.  She has no neurologic complaints. She denies any recent fevers or illnesses. She has a good appetite and has maintained her weight.  She has no chest pain, cough, or hemoptysis.  She has no nausea, vomiting, constipation, or diarrhea.  She has no urinary complaints. Patient offers no further specific complaints today.  REVIEW OF SYSTEMS:   Review of Systems  Constitutional: Negative.  Negative for fever, malaise/fatigue and weight loss.  Respiratory: Negative.  Negative for cough and shortness of breath.   Cardiovascular: Negative.  Negative for chest pain and leg swelling.  Gastrointestinal: Negative.  Negative for abdominal pain.  Genitourinary: Negative.   Musculoskeletal: Negative.   Skin: Positive for itching and rash.  Neurological: Negative.  Negative for sensory change and weakness.  Psychiatric/Behavioral: The patient is nervous/anxious.    As per HPI. Otherwise, a complete review of  systems is negative.   PAST MEDICAL HISTORY:  Hypothyroidism, migraines, depression, asthma.  PAST SURGICAL HISTORY: Bilateral mastectomy with reconstruction, partial hysterectomy.  FAMILY HISTORY: Lung cancer, melanoma, stomach cancer.  Also diabetes, CAD, hypertension     ADVANCED DIRECTIVES:    HEALTH MAINTENANCE: Social History   Tobacco Use  . Smoking status: Never Smoker  . Smokeless tobacco: Never Used  Substance Use Topics  . Alcohol use: No  . Drug use: No     Colonoscopy:  PAP:  Bone density:  Lipid panel:  Allergies  Allergen Reactions  . Codeine Other (See Comments)    constipation  . Sulfa Antibiotics Other (See Comments)    Allergy as a child    Current Outpatient Medications  Medication Sig Dispense Refill  . alendronate (FOSAMAX) 70 MG tablet Take 70 mg by mouth once a week. Take with a full glass of water on an empty stomach.     . ALPRAZolam (XANAX) 0.25 MG tablet Take 1 tablet by mouth 3 (three) times daily as needed for anxiety.     Marland Kitchen azelastine (ASTELIN) 0.1 % nasal spray Place 1 spray into both nostrils daily as needed for rhinitis. Use in each nostril as directed    . calcium-vitamin D (OSCAL WITH D) 250-125 MG-UNIT tablet Take 1 tablet by mouth daily.    . Cholecalciferol (D3 HIGH POTENCY) 2000 units CAPS Take 1 capsule by mouth daily.    . cyclobenzaprine (FLEXERIL) 10 MG tablet Take 10 mg by mouth 3 (three) times daily as needed.     . DULoxetine (CYMBALTA) 30 MG capsule Take 90 mg by mouth every morning.     . fluticasone (FLOVENT HFA) 110 MCG/ACT inhaler Inhale  1 puff into the lungs 2 (two) times daily.    Marland Kitchen levothyroxine (SYNTHROID, LEVOTHROID) 137 MCG tablet Take 137 mcg by mouth daily before breakfast.     . lidocaine-prilocaine (EMLA) cream Apply to affected area once 30 g 3  . metoprolol succinate (TOPROL-XL) 25 MG 24 hr tablet Take 75 mg by mouth at bedtime.     . Multiple Vitamin (MULTIVITAMIN) tablet Take 1 tablet by mouth daily.     . ondansetron (ZOFRAN) 8 MG tablet Take 1 tablet (8 mg total) by mouth 2 (two) times daily as needed (Nausea or vomiting). 60 tablet 3  . prochlorperazine (COMPAZINE) 10 MG tablet Take 1 tablet (10 mg total) by mouth every 6 (six) hours as needed (Nausea or vomiting). 60 tablet 3  . topiramate (TOPAMAX) 50 MG tablet Take 50 mg by mouth at bedtime.     . traMADol (ULTRAM) 50 MG tablet Take 25-50 mg by mouth every 6 (six) hours as needed.     . Triamcinolone Acetonide (TRIAMCINOLONE 0.1 % CREAM : EUCERIN) CREA Apply 1 application topically 2 (two) times daily as needed. 1 each 0   No current facility-administered medications for this visit.    Facility-Administered Medications Ordered in Other Visits  Medication Dose Route Frequency Provider Last Rate Last Dose  . diphenhydrAMINE (BENADRYL) capsule 25 mg  25 mg Oral Once Lloyd Huger, MD      . sodium chloride flush (NS) 0.9 % injection 10 mL  10 mL Intracatheter PRN Lloyd Huger, MD   10 mL at 06/19/17 1345    OBJECTIVE: Vitals:   06/19/17 0924  BP: 130/82  Pulse: (!) 101  Resp: 18  Temp: 99.6 F (37.6 C)  SpO2: 98%     Body mass index is 37.8 kg/m.    ECOG FS:0 - Asymptomatic  General: Well-developed, well-nourished, no acute distress. Eyes: anicteric sclera. Breasts: Patient has bilateral mastectomies with reconstruction, no obvious evidence of local recurrence. Lungs: Clear to auscultation bilaterally. Port in place in the right chest wall. Heart: Regular rate and rhythm. No rubs, murmurs, or gallops. Abdomen: Soft, nontender, nondistended. No organomegaly noted, normoactive bowel sounds. Musculoskeletal: No edema, cyanosis, or clubbing. Neuro: Alert, answering all questions appropriately. Cranial nerves grossly intact. Skin: Mild erythema noted on scalp and torso. Psych: Normal affect.   LAB RESULTS:  Lab Results  Component Value Date   NA 138 04/17/2017   K 3.5 04/17/2017   CL 111 04/17/2017   CO2 21  (L) 04/17/2017   GLUCOSE 132 (H) 04/17/2017   BUN 14 04/17/2017   CREATININE 0.89 04/17/2017   CALCIUM 9.3 04/17/2017   PROT 7.2 04/17/2017   ALBUMIN 4.0 04/17/2017   AST 36 04/17/2017   ALT 32 04/17/2017   ALKPHOS 64 04/17/2017   BILITOT 0.5 04/17/2017   GFRNONAA >60 04/17/2017   GFRAA >60 04/17/2017    Lab Results  Component Value Date   WBC 5.4 04/17/2017   NEUTROABS 3.5 04/17/2017   HGB 13.9 04/17/2017   HCT 40.1 04/17/2017   MCV 93.0 04/17/2017   PLT 244 04/17/2017     STUDIES: No results found.  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    PLAN:    1. 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 Patient found to have extensive lymphatic disease as well as brain metastasis. Patient's CA-27-29 is now mildly elevated at 65.6.  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 Patient's laboratory work is adequate to proceed with cycle 3, day 8 of Taxol and Herceptin. Will give treatments on days 1, 8, and 15 with day 22 off. Plan to reimage at the conclusion of cycle 3.  Return to clinic in 1 week for consideration of cycle 3, day 15.  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 2. Brain metastasis: Patient has completed XRT. Repeat brain imaging in ~January 2019.  3. 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 Continue follow-up with cardiology as indicated. 4. Rash:  Unclear etiology, possibly related to XRT. Isolated to her scalp and improving. Continue topical treatment.  Increase premedication of dexamethasone to 20 mg IV with each treatment.  Patient expressed understanding and was in agreement with this plan. She also understands that She can call clinic at any time with any questions, concerns, or complaints.    Breast cancer   Staging form: Breast, AJCC 7th Edition     Clinical stage from 02/14/2015: Stage IA (T1c, N0, M0) - Signed by Lloyd Huger, MD on 02/14/2015   Lloyd Huger, MD   06/19/2017 2:22 PM

## 2017-06-19 ENCOUNTER — Inpatient Hospital Stay: Payer: 59 | Attending: Oncology | Admitting: Oncology

## 2017-06-19 ENCOUNTER — Inpatient Hospital Stay: Payer: 59

## 2017-06-19 ENCOUNTER — Encounter: Payer: Self-pay | Admitting: Oncology

## 2017-06-19 VITALS — BP 130/82 | HR 101 | Temp 99.6°F | Resp 18 | Wt 213.4 lb

## 2017-06-19 DIAGNOSIS — Z9013 Acquired absence of bilateral breasts and nipples: Secondary | ICD-10-CM | POA: Diagnosis not present

## 2017-06-19 DIAGNOSIS — F329 Major depressive disorder, single episode, unspecified: Secondary | ICD-10-CM | POA: Insufficient documentation

## 2017-06-19 DIAGNOSIS — Z923 Personal history of irradiation: Secondary | ICD-10-CM | POA: Diagnosis not present

## 2017-06-19 DIAGNOSIS — C7931 Secondary malignant neoplasm of brain: Secondary | ICD-10-CM | POA: Diagnosis not present

## 2017-06-19 DIAGNOSIS — Z170 Estrogen receptor positive status [ER+]: Secondary | ICD-10-CM | POA: Insufficient documentation

## 2017-06-19 DIAGNOSIS — Z5112 Encounter for antineoplastic immunotherapy: Secondary | ICD-10-CM | POA: Diagnosis not present

## 2017-06-19 DIAGNOSIS — C778 Secondary and unspecified malignant neoplasm of lymph nodes of multiple regions: Secondary | ICD-10-CM | POA: Insufficient documentation

## 2017-06-19 DIAGNOSIS — J45909 Unspecified asthma, uncomplicated: Secondary | ICD-10-CM | POA: Diagnosis not present

## 2017-06-19 DIAGNOSIS — R21 Rash and other nonspecific skin eruption: Secondary | ICD-10-CM | POA: Insufficient documentation

## 2017-06-19 DIAGNOSIS — Z800 Family history of malignant neoplasm of digestive organs: Secondary | ICD-10-CM | POA: Insufficient documentation

## 2017-06-19 DIAGNOSIS — Z5111 Encounter for antineoplastic chemotherapy: Secondary | ICD-10-CM | POA: Insufficient documentation

## 2017-06-19 DIAGNOSIS — Z808 Family history of malignant neoplasm of other organs or systems: Secondary | ICD-10-CM | POA: Insufficient documentation

## 2017-06-19 DIAGNOSIS — Z79899 Other long term (current) drug therapy: Secondary | ICD-10-CM | POA: Diagnosis not present

## 2017-06-19 DIAGNOSIS — C50411 Malignant neoplasm of upper-outer quadrant of right female breast: Secondary | ICD-10-CM | POA: Insufficient documentation

## 2017-06-19 DIAGNOSIS — E039 Hypothyroidism, unspecified: Secondary | ICD-10-CM | POA: Diagnosis not present

## 2017-06-19 DIAGNOSIS — Z801 Family history of malignant neoplasm of trachea, bronchus and lung: Secondary | ICD-10-CM | POA: Insufficient documentation

## 2017-06-19 MED ORDER — SODIUM CHLORIDE 0.9% FLUSH
10.0000 mL | INTRAVENOUS | Status: DC | PRN
  Administered 2017-06-19: 10 mL
  Filled 2017-06-19: qty 10

## 2017-06-19 MED ORDER — DIPHENHYDRAMINE HCL 50 MG/ML IJ SOLN
25.0000 mg | Freq: Once | INTRAMUSCULAR | Status: AC
  Administered 2017-06-19: 25 mg via INTRAVENOUS
  Filled 2017-06-19: qty 1

## 2017-06-19 MED ORDER — SODIUM CHLORIDE 0.9 % IV SOLN
Freq: Once | INTRAVENOUS | Status: AC
  Administered 2017-06-19: 11:00:00 via INTRAVENOUS
  Filled 2017-06-19: qty 1000

## 2017-06-19 MED ORDER — FAMOTIDINE IN NACL 20-0.9 MG/50ML-% IV SOLN
20.0000 mg | Freq: Once | INTRAVENOUS | Status: AC
  Administered 2017-06-19: 20 mg via INTRAVENOUS
  Filled 2017-06-19: qty 50

## 2017-06-19 MED ORDER — ACETAMINOPHEN 325 MG PO TABS
650.0000 mg | ORAL_TABLET | Freq: Once | ORAL | Status: AC
  Administered 2017-06-19: 650 mg via ORAL
  Filled 2017-06-19: qty 2

## 2017-06-19 MED ORDER — SODIUM CHLORIDE 0.9 % IV SOLN
2.0000 mg/kg | Freq: Once | INTRAVENOUS | Status: AC
  Administered 2017-06-19: 189 mg via INTRAVENOUS
  Filled 2017-06-19: qty 9

## 2017-06-19 MED ORDER — PACLITAXEL CHEMO INJECTION 300 MG/50ML
80.0000 mg/m2 | Freq: Once | INTRAVENOUS | Status: AC
  Administered 2017-06-19: 168 mg via INTRAVENOUS
  Filled 2017-06-19: qty 28

## 2017-06-19 MED ORDER — DEXAMETHASONE SODIUM PHOSPHATE 10 MG/ML IJ SOLN: 20.0000 mg | Freq: Once | INTRAMUSCULAR | Status: DC

## 2017-06-19 MED ORDER — SODIUM CHLORIDE 0.9 % IV SOLN
Freq: Once | INTRAVENOUS | Status: AC
  Administered 2017-06-19: 11:00:00 via INTRAVENOUS
  Filled 2017-06-19: qty 2

## 2017-06-19 MED ORDER — HEPARIN SOD (PORK) LOCK FLUSH 100 UNIT/ML IV SOLN
500.0000 [IU] | Freq: Once | INTRAVENOUS | Status: AC | PRN
  Administered 2017-06-19: 500 [IU]

## 2017-06-19 MED ORDER — HEPARIN SOD (PORK) LOCK FLUSH 100 UNIT/ML IV SOLN
INTRAVENOUS | Status: AC
  Filled 2017-06-19: qty 5

## 2017-06-19 MED ORDER — DIPHENHYDRAMINE HCL 25 MG PO CAPS: 25.0000 mg | ORAL_CAPSULE | Freq: Once | ORAL | Status: DC

## 2017-06-23 NOTE — Progress Notes (Signed)
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   INTERVAL HISTORY: Patient returns to clinic today for further evaluation and consideration of cycle 3, day 15 of Taxol and Herceptin. She continues to have an erythematous rash which is isolated to her scalp for approximately 24-48 hours after her treatment which then resolves with topical cream.  Her rash was improved with increasing her dexamethasone premedication.  She is otherwise tolerating her treatments well without significant side effects. She has no further shortness of breath.  She has no neurologic complaints. She denies any recent fevers or illnesses. She has a good appetite and has maintained her weight.  She has no chest pain, cough, or hemoptysis.  She has no nausea, vomiting, constipation, or diarrhea.  She has no urinary complaints. Patient offers no further specific complaints today.  REVIEW OF SYSTEMS:   Review of Systems  Constitutional: Negative.  Negative for fever, malaise/fatigue and weight loss.  Respiratory: Negative.  Negative for cough and shortness of breath.   Cardiovascular: Negative.  Negative for chest pain and leg swelling.  Gastrointestinal: Negative.  Negative for abdominal pain.  Genitourinary: Negative.   Musculoskeletal: Negative.   Skin: Positive for rash. Negative for itching.  Neurological: Negative.  Negative for sensory change and weakness.  Psychiatric/Behavioral: The patient is nervous/anxious.    As per HPI. Otherwise, a complete review of systems is  negative.   PAST MEDICAL HISTORY:  Hypothyroidism, migraines, depression, asthma.  PAST SURGICAL HISTORY: Bilateral mastectomy with reconstruction, partial hysterectomy.  FAMILY HISTORY: Lung cancer, melanoma, stomach cancer.  Also diabetes, CAD, hypertension     ADVANCED DIRECTIVES:    HEALTH MAINTENANCE: Social History   Tobacco Use  . Smoking status: Never Smoker  . Smokeless tobacco: Never Used  Substance Use Topics  . Alcohol use: No  . Drug use: No     Colonoscopy:  PAP:  Bone density:  Lipid panel:  Allergies  Allergen Reactions  . Codeine Other (See Comments)    constipation  . Sulfa Antibiotics Other (See Comments)    Allergy as a child    Current Outpatient Medications  Medication Sig Dispense Refill  . alendronate (FOSAMAX) 70 MG tablet Take 70 mg by mouth once a week. Take with a full glass of water on an empty stomach.     . ALPRAZolam (XANAX) 0.25 MG tablet Take 1 tablet by mouth 3 (three) times daily as needed for anxiety.     Marland Kitchen azelastine (ASTELIN) 0.1 % nasal spray Place 1 spray into both nostrils daily as needed for rhinitis. Use in each nostril as directed    . calcium-vitamin D (OSCAL WITH D) 250-125 MG-UNIT tablet Take 1 tablet by mouth daily.    . Cholecalciferol (D3 HIGH POTENCY) 2000 units CAPS Take 1 capsule by mouth daily.    . cyclobenzaprine (FLEXERIL) 10 MG tablet Take 10 mg by mouth 3 (three) times daily as needed.     . DULoxetine (CYMBALTA) 30 MG capsule Take 90 mg by mouth every morning.     . fluticasone (FLOVENT HFA) 110 MCG/ACT inhaler Inhale 1 puff into the  lungs 2 (two) times daily.    Marland Kitchen levothyroxine (SYNTHROID, LEVOTHROID) 137 MCG tablet Take 137 mcg by mouth daily before breakfast.     . lidocaine-prilocaine (EMLA) cream Apply to affected area once 30 g 3  . metoprolol succinate (TOPROL-XL) 25 MG 24 hr tablet Take 75 mg by mouth at bedtime.     . Multiple Vitamin (MULTIVITAMIN) tablet Take 1 tablet by mouth daily.    .  ondansetron (ZOFRAN) 8 MG tablet Take 1 tablet (8 mg total) by mouth 2 (two) times daily as needed (Nausea or vomiting). 60 tablet 3  . prochlorperazine (COMPAZINE) 10 MG tablet Take 1 tablet (10 mg total) by mouth every 6 (six) hours as needed (Nausea or vomiting). 60 tablet 3  . topiramate (TOPAMAX) 50 MG tablet Take 50 mg by mouth at bedtime.     . traMADol (ULTRAM) 50 MG tablet Take 25-50 mg by mouth every 6 (six) hours as needed.     . Triamcinolone Acetonide (TRIAMCINOLONE 0.1 % CREAM : EUCERIN) CREA Apply 1 application topically 2 (two) times daily as needed. 1 each 0   No current facility-administered medications for this visit.    Facility-Administered Medications Ordered in Other Visits  Medication Dose Route Frequency Provider Last Rate Last Dose  . diphenhydrAMINE (BENADRYL) capsule 25 mg  25 mg Oral Once Lloyd Huger, MD      . PACLitaxel (TAXOL) 168 mg in dextrose 5 % 250 mL chemo infusion (</= 42m/m2)  80 mg/m2 (Treatment Plan Recorded) Intravenous Once FLloyd Huger MD 278 mL/hr at 06/26/17 1222 168 mg at 06/26/17 1222    OBJECTIVE: Vitals:   06/26/17 0934  BP: 127/83  Pulse: (!) 103  Temp: 98.3 F (36.8 C)     Body mass index is 37.25 kg/m.    ECOG FS:0 - Asymptomatic  General: Well-developed, well-nourished, no acute distress. Eyes: anicteric sclera. Breasts: Patient has bilateral mastectomies with reconstruction, no obvious evidence of local recurrence. Lungs: Clear to auscultation bilaterally. Port in place in the right chest wall. Heart: Regular rate and rhythm. No rubs, murmurs, or gallops. Abdomen: Soft, nontender, nondistended. No organomegaly noted, normoactive bowel sounds. Musculoskeletal: No edema, cyanosis, or clubbing. Neuro: Alert, answering all questions appropriately. Cranial nerves grossly intact. Skin: Mild erythema noted on scalp and torso. Psych: Normal affect.   LAB RESULTS:  Lab Results  Component Value Date   NA 138  04/17/2017   K 3.5 04/17/2017   CL 111 04/17/2017   CO2 21 (L) 04/17/2017   GLUCOSE 132 (H) 04/17/2017   BUN 14 04/17/2017   CREATININE 0.89 04/17/2017   CALCIUM 9.3 04/17/2017   PROT 7.2 04/17/2017   ALBUMIN 4.0 04/17/2017   AST 36 04/17/2017   ALT 32 04/17/2017   ALKPHOS 64 04/17/2017   BILITOT 0.5 04/17/2017   GFRNONAA >60 04/17/2017   GFRAA >60 04/17/2017    Lab Results  Component Value Date   WBC 5.4 04/17/2017   NEUTROABS 3.5 04/17/2017   HGB 13.9 04/17/2017   HCT 40.1 04/17/2017   MCV 93.0 04/17/2017   PLT 244 04/17/2017     STUDIES: No results found.  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    PLAN:    1. 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 Patient found to have extensive lymphatic disease as well as brain metastasis. Patient's CA-27-29 is now within normal limits at 20.8.  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 Patient's laboratory work is adequate to proceed with cycle 3, day 15 of Taxol and Herceptin. Will give treatments on days 1, 8, and 15 with day 22 off. Plan to reimage with PET scan prior to cycle 4.  Return to clinic in 2 weeks for consideration of cycle 4, day 1.  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 2. Brain metastasis: Patient has completed XRT. Repeat brain imaging in ~January 2019.  3. Malignant pericardial fluid: This has not  recurred. Continue follow-up with cardiology as indicated. 4. Rash: Unclear etiology, possibly related to XRT. Isolated to her scalp and improving. Continue topical treatment.  Increase premedication of dexamethasone to 20 mg IV with each treatment.  Patient expressed understanding and was in agreement with this plan. She also understands that She can call clinic at any time with any questions, concerns, or complaints.    Breast cancer   Staging form: Breast, AJCC 7th Edition     Clinical stage from 02/14/2015: Stage IA (T1c, N0, M0) - Signed by Lloyd Huger, MD on 02/14/2015   Lloyd Huger, MD   06/26/2017 1:08 PM

## 2017-06-24 DIAGNOSIS — C50411 Malignant neoplasm of upper-outer quadrant of right female breast: Secondary | ICD-10-CM | POA: Diagnosis not present

## 2017-06-26 ENCOUNTER — Inpatient Hospital Stay: Payer: 59

## 2017-06-26 ENCOUNTER — Other Ambulatory Visit: Payer: Self-pay

## 2017-06-26 ENCOUNTER — Encounter: Payer: Self-pay | Admitting: Oncology

## 2017-06-26 ENCOUNTER — Inpatient Hospital Stay (HOSPITAL_BASED_OUTPATIENT_CLINIC_OR_DEPARTMENT_OTHER): Payer: 59 | Admitting: Oncology

## 2017-06-26 VITALS — BP 127/83 | HR 103 | Temp 98.3°F | Ht 63.0 in | Wt 210.3 lb

## 2017-06-26 DIAGNOSIS — Z923 Personal history of irradiation: Secondary | ICD-10-CM | POA: Diagnosis not present

## 2017-06-26 DIAGNOSIS — Z9013 Acquired absence of bilateral breasts and nipples: Secondary | ICD-10-CM | POA: Diagnosis not present

## 2017-06-26 DIAGNOSIS — J45909 Unspecified asthma, uncomplicated: Secondary | ICD-10-CM

## 2017-06-26 DIAGNOSIS — Z801 Family history of malignant neoplasm of trachea, bronchus and lung: Secondary | ICD-10-CM | POA: Diagnosis not present

## 2017-06-26 DIAGNOSIS — C778 Secondary and unspecified malignant neoplasm of lymph nodes of multiple regions: Secondary | ICD-10-CM | POA: Diagnosis not present

## 2017-06-26 DIAGNOSIS — F329 Major depressive disorder, single episode, unspecified: Secondary | ICD-10-CM

## 2017-06-26 DIAGNOSIS — C50411 Malignant neoplasm of upper-outer quadrant of right female breast: Secondary | ICD-10-CM

## 2017-06-26 DIAGNOSIS — Z170 Estrogen receptor positive status [ER+]: Secondary | ICD-10-CM | POA: Diagnosis not present

## 2017-06-26 DIAGNOSIS — C7931 Secondary malignant neoplasm of brain: Secondary | ICD-10-CM | POA: Diagnosis not present

## 2017-06-26 DIAGNOSIS — E039 Hypothyroidism, unspecified: Secondary | ICD-10-CM

## 2017-06-26 DIAGNOSIS — R21 Rash and other nonspecific skin eruption: Secondary | ICD-10-CM | POA: Diagnosis not present

## 2017-06-26 DIAGNOSIS — Z79899 Other long term (current) drug therapy: Secondary | ICD-10-CM

## 2017-06-26 MED ORDER — ACETAMINOPHEN 325 MG PO TABS
650.0000 mg | ORAL_TABLET | Freq: Once | ORAL | Status: AC
  Administered 2017-06-26: 650 mg via ORAL
  Filled 2017-06-26: qty 2

## 2017-06-26 MED ORDER — DIPHENHYDRAMINE HCL 25 MG PO CAPS: 25.0000 mg | ORAL_CAPSULE | Freq: Once | ORAL | Status: DC

## 2017-06-26 MED ORDER — DIPHENHYDRAMINE HCL 50 MG/ML IJ SOLN
25.0000 mg | Freq: Once | INTRAMUSCULAR | Status: AC
  Administered 2017-06-26: 25 mg via INTRAVENOUS
  Filled 2017-06-26: qty 1

## 2017-06-26 MED ORDER — DEXAMETHASONE SODIUM PHOSPHATE 10 MG/ML IJ SOLN
20.0000 mg | Freq: Once | INTRAMUSCULAR | Status: DC
Start: 2017-06-26 — End: 2017-06-26

## 2017-06-26 MED ORDER — FAMOTIDINE IN NACL 20-0.9 MG/50ML-% IV SOLN
20.0000 mg | Freq: Once | INTRAVENOUS | Status: AC
  Administered 2017-06-26: 20 mg via INTRAVENOUS
  Filled 2017-06-26: qty 50

## 2017-06-26 MED ORDER — SODIUM CHLORIDE 0.9 % IV SOLN
Freq: Once | INTRAVENOUS | Status: AC
  Administered 2017-06-26: 11:00:00 via INTRAVENOUS
  Filled 2017-06-26: qty 1000

## 2017-06-26 MED ORDER — SODIUM CHLORIDE 0.9 % IV SOLN
20.0000 mg | Freq: Once | INTRAVENOUS | Status: AC
  Administered 2017-06-26: 20 mg via INTRAVENOUS
  Filled 2017-06-26: qty 2

## 2017-06-26 MED ORDER — HEPARIN SOD (PORK) LOCK FLUSH 100 UNIT/ML IV SOLN
500.0000 [IU] | Freq: Once | INTRAVENOUS | Status: AC | PRN
  Administered 2017-06-26: 500 [IU]

## 2017-06-26 MED ORDER — SODIUM CHLORIDE 0.9 % IV SOLN
2.0000 mg/kg | Freq: Once | INTRAVENOUS | Status: AC
  Administered 2017-06-26: 189 mg via INTRAVENOUS
  Filled 2017-06-26: qty 9

## 2017-06-26 MED ORDER — PACLITAXEL CHEMO INJECTION 300 MG/50ML
80.0000 mg/m2 | Freq: Once | INTRAVENOUS | Status: AC
  Administered 2017-06-26: 168 mg via INTRAVENOUS
  Filled 2017-06-26: qty 28

## 2017-06-26 NOTE — Progress Notes (Signed)
Patient here for follow up patient states no changes since last appointment.

## 2017-06-27 ENCOUNTER — Encounter: Payer: Self-pay | Admitting: Oncology

## 2017-06-29 ENCOUNTER — Encounter: Payer: Self-pay | Admitting: Oncology

## 2017-07-01 ENCOUNTER — Other Ambulatory Visit: Payer: Self-pay | Admitting: *Deleted

## 2017-07-01 DIAGNOSIS — C50919 Malignant neoplasm of unspecified site of unspecified female breast: Secondary | ICD-10-CM

## 2017-07-03 ENCOUNTER — Telehealth: Payer: Self-pay | Admitting: Oncology

## 2017-07-03 NOTE — Telephone Encounter (Signed)
Left message with appt change details. Instructed pt to call with any concerns. PET scan canceled due to insurance approval and CT ordered in it's place. Notified pt that scan would need to be completed at [facility] location due to treatment date approaching on 11/28.

## 2017-07-08 ENCOUNTER — Other Ambulatory Visit: Payer: Self-pay | Admitting: Oncology

## 2017-07-08 DIAGNOSIS — C50411 Malignant neoplasm of upper-outer quadrant of right female breast: Secondary | ICD-10-CM | POA: Diagnosis not present

## 2017-07-09 ENCOUNTER — Ambulatory Visit: Admission: RE | Admit: 2017-07-09 | Payer: 59 | Source: Ambulatory Visit

## 2017-07-09 ENCOUNTER — Ambulatory Visit
Admission: RE | Admit: 2017-07-09 | Discharge: 2017-07-09 | Disposition: A | Discharge status: Home / Self Care | Payer: 59 | Source: Ambulatory Visit | Attending: Oncology | Admitting: Oncology

## 2017-07-09 DIAGNOSIS — Z9882 Breast implant status: Secondary | ICD-10-CM | POA: Insufficient documentation

## 2017-07-09 DIAGNOSIS — C50919 Malignant neoplasm of unspecified site of unspecified female breast: Secondary | ICD-10-CM | POA: Diagnosis present

## 2017-07-09 DIAGNOSIS — Z90710 Acquired absence of both cervix and uterus: Secondary | ICD-10-CM | POA: Diagnosis not present

## 2017-07-09 DIAGNOSIS — R918 Other nonspecific abnormal finding of lung field: Secondary | ICD-10-CM | POA: Diagnosis not present

## 2017-07-09 DIAGNOSIS — Z9013 Acquired absence of bilateral breasts and nipples: Secondary | ICD-10-CM | POA: Diagnosis not present

## 2017-07-09 DIAGNOSIS — C7981 Secondary malignant neoplasm of breast: Secondary | ICD-10-CM | POA: Diagnosis not present

## 2017-07-09 LAB — COMPREHENSIVE METABOLIC PANEL
ALT: 9 IU/L (ref 0–32)
AST: 19 IU/L (ref 0–40)
Albumin/Globulin Ratio: 1.7 (ref 1.2–2.2)
Albumin: 3.9 g/dL (ref 3.6–4.8)
Alkaline Phosphatase: 71 IU/L (ref 39–117)
BUN/Creatinine Ratio: 11 — ABNORMAL LOW (ref 12–28)
BUN: 10 mg/dL (ref 8–27)
Bilirubin Total: 0.3 mg/dL (ref 0.0–1.2)
CALCIUM: 9.1 mg/dL (ref 8.7–10.3)
CHLORIDE: 108 mmol/L — AB (ref 96–106)
CO2: 14 mmol/L — AB (ref 20–29)
Creatinine, Ser: 0.94 mg/dL (ref 0.57–1.00)
GFR calc Af Amer: 76 mL/min/{1.73_m2} (ref >59)
GFR calc non Af Amer: 66 mL/min/{1.73_m2} (ref >59)
GLOBULIN, TOTAL: 2.3 g/dL (ref 1.5–4.5)
Glucose: 97 mg/dL (ref 65–99)
POTASSIUM: 3.8 mmol/L (ref 3.5–5.2)
SODIUM: 139 mmol/L (ref 134–144)
Total Protein: 6.2 g/dL (ref 6.0–8.5)

## 2017-07-09 LAB — CBC WITH DIFFERENTIAL/PLATELET
BASOS ABS: 0 10*3/uL (ref 0.0–0.2)
Basos: 1 %
EOS (ABSOLUTE): 0.2 10*3/uL (ref 0.0–0.4)
Eos: 3 %
HEMATOCRIT: 37.1 % (ref 34.0–46.6)
Hemoglobin: 12.3 g/dL (ref 11.1–15.9)
IMMATURE GRANULOCYTES: 1 %
Immature Grans (Abs): 0 10*3/uL (ref 0.0–0.1)
LYMPHS ABS: 0.7 10*3/uL (ref 0.7–3.1)
Lymphs: 12 %
MCH: 32.5 pg (ref 26.6–33.0)
MCHC: 33.2 g/dL (ref 31.5–35.7)
MCV: 98 fL — ABNORMAL HIGH (ref 79–97)
MONOS ABS: 0.6 10*3/uL (ref 0.1–0.9)
Monocytes: 12 %
NEUTROS PCT: 71 %
Neutrophils Absolute: 3.8 10*3/uL (ref 1.4–7.0)
PLATELETS: 388 10*3/uL — AB (ref 150–379)
RBC: 3.79 x10E6/uL (ref 3.77–5.28)
RDW: 15 % (ref 12.3–15.4)
WBC: 5.3 10*3/uL (ref 3.4–10.8)

## 2017-07-09 LAB — POCT I-STAT CREATININE: Creatinine, Ser: 0.7 mg/dL (ref 0.44–1.00)

## 2017-07-09 MED ORDER — IOPAMIDOL (ISOVUE-300) INJECTION 61%
100.0000 mL | Freq: Once | INTRAVENOUS | Status: AC | PRN
  Administered 2017-07-09: 100 mL via INTRAVENOUS

## 2017-07-09 NOTE — Progress Notes (Signed)
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   INTERVAL HISTORY: Patient returns to clinic today for further evaluation, discussion of her CT results, and consideration of cycle 4, day 1 of Taxol and Herceptin. She continues to have an erythematous rash which is isolated to her scalp for approximately 24-48 hours after her treatment which then resolves with topical cream. Her rash was improved with increasing her dexamethasone premedication.  She is otherwise tolerating her treatments well without significant side effects. She has no further shortness of breath.  She has no neurologic complaints. She denies any recent fevers or illnesses. She has a good appetite and has maintained her weight.  She has no chest pain, cough, or hemoptysis.  She has no nausea, vomiting, constipation, or diarrhea.  She has no urinary complaints. Patient offers no further specific complaints today.  REVIEW OF SYSTEMS:   Review of Systems  Constitutional: Negative.  Negative for fever, malaise/fatigue and weight loss.  Respiratory: Negative.  Negative for cough and shortness of breath.   Cardiovascular: Negative.  Negative for chest pain and leg swelling.  Gastrointestinal: Negative.  Negative for abdominal pain.  Genitourinary: Negative.   Musculoskeletal: Negative.   Skin: Positive for rash. Negative for itching.  Neurological: Negative.  Negative for sensory change and weakness.  Psychiatric/Behavioral: The patient is nervous/anxious.    As per HPI. Otherwise, a  complete review of systems is negative.   PAST MEDICAL HISTORY:  Hypothyroidism, migraines, depression, asthma.  PAST SURGICAL HISTORY: Bilateral mastectomy with reconstruction, partial hysterectomy.  FAMILY HISTORY: Lung cancer, melanoma, stomach cancer.  Also diabetes, CAD, hypertension     ADVANCED DIRECTIVES:    HEALTH MAINTENANCE: Social History   Tobacco Use  . Smoking status: Never Smoker  . Smokeless tobacco: Never Used  Substance Use Topics  . Alcohol use: No  . Drug use: No     Colonoscopy:  PAP:  Bone density:  Lipid panel:  Allergies  Allergen Reactions  . Codeine Other (See Comments)    constipation  . Sulfa Antibiotics Other (See Comments)    Allergy as a child    Current Outpatient Medications  Medication Sig Dispense Refill  . alendronate (FOSAMAX) 70 MG tablet Take 70 mg by mouth once a week. Take with a full glass of water on an empty stomach.     . ALPRAZolam (XANAX) 0.25 MG tablet Take 1 tablet by mouth 3 (three) times daily as needed for anxiety.     Marland Kitchen azelastine (ASTELIN) 0.1 % nasal spray Place 1 spray into both nostrils daily as needed for rhinitis. Use in each nostril as directed    . calcium-vitamin D (OSCAL WITH D) 250-125 MG-UNIT tablet Take 1 tablet by mouth daily.    . Cholecalciferol (D3 HIGH POTENCY) 2000 units CAPS Take 1 capsule by mouth daily.    . cyclobenzaprine (FLEXERIL) 10 MG tablet Take 10 mg by mouth 3 (three) times daily as needed.     . DULoxetine (CYMBALTA) 30 MG capsule Take 90 mg by mouth every morning.     . fluticasone (FLOVENT HFA) 110 MCG/ACT inhaler Inhale  1 puff into the lungs 2 (two) times daily.    Marland Kitchen levothyroxine (SYNTHROID, LEVOTHROID) 137 MCG tablet Take 137 mcg by mouth daily before breakfast.     . lidocaine-prilocaine (EMLA) cream Apply to affected area once 30 g 3  . metoprolol succinate (TOPROL-XL) 25 MG 24 hr tablet Take 75 mg by mouth at bedtime.     . Multiple Vitamin (MULTIVITAMIN) tablet Take 1  tablet by mouth daily.    . ondansetron (ZOFRAN) 8 MG tablet Take 1 tablet (8 mg total) by mouth 2 (two) times daily as needed (Nausea or vomiting). 60 tablet 3  . prochlorperazine (COMPAZINE) 10 MG tablet Take 1 tablet (10 mg total) by mouth every 6 (six) hours as needed (Nausea or vomiting). 60 tablet 3  . topiramate (TOPAMAX) 50 MG tablet Take 50 mg by mouth at bedtime.     . traMADol (ULTRAM) 50 MG tablet Take 25-50 mg by mouth every 6 (six) hours as needed.     . Triamcinolone Acetonide (TRIAMCINOLONE 0.1 % CREAM : EUCERIN) CREA Apply 1 application topically 2 (two) times daily as needed. 1 each 0   No current facility-administered medications for this visit.     OBJECTIVE: Vitals:   07/10/17 0936  BP: 111/77  Pulse: 88  Resp: 18  Temp: 99.1 F (37.3 C)     Body mass index is 36.42 kg/m.    ECOG FS:0 - Asymptomatic  General: Well-developed, well-nourished, no acute distress. Eyes: anicteric sclera. Breasts: Patient has bilateral mastectomies with reconstruction, no obvious evidence of local recurrence. Lungs: Clear to auscultation bilaterally. Port in place in the right chest wall. Heart: Regular rate and rhythm. No rubs, murmurs, or gallops. Abdomen: Soft, nontender, nondistended. No organomegaly noted, normoactive bowel sounds. Musculoskeletal: No edema, cyanosis, or clubbing. Neuro: Alert, answering all questions appropriately. Cranial nerves grossly intact. Skin: Mild erythema noted on scalp and torso. Psych: Normal affect.   LAB RESULTS:  Lab Results  Component Value Date   NA 139 07/08/2017   K 3.8 07/08/2017   CL 108 (H) 07/08/2017   CO2 14 (L) 07/08/2017   GLUCOSE 97 07/08/2017   BUN 10 07/08/2017   CREATININE 0.70 07/09/2017   CALCIUM 9.1 07/08/2017   PROT 6.2 07/08/2017   ALBUMIN 3.9 07/08/2017   AST 19 07/08/2017   ALT 9 07/08/2017   ALKPHOS 71 07/08/2017   BILITOT 0.3 07/08/2017   GFRNONAA 66 07/08/2017   GFRAA 76 07/08/2017    Lab Results    Component Value Date   WBC 5.3 07/08/2017   NEUTROABS 3.8 07/08/2017   HGB 12.3 07/08/2017   HCT 37.1 07/08/2017   MCV 98 (H) 07/08/2017   PLT 388 (H) 07/08/2017     STUDIES: Ct Chest W Contrast  Result Date: 07/09/2017 CLINICAL DATA:  Metastatic breast cancer.  Restaging. EXAM: CT CHEST, ABDOMEN, AND PELVIS WITH CONTRAST TECHNIQUE: Multidetector CT imaging of the chest, abdomen and pelvis was performed following the standard protocol during bolus administration of intravenous contrast. CONTRAST:  188m ISOVUE-300 IOPAMIDOL (ISOVUE-300) INJECTION 61% COMPARISON:  PET-CT 04/03/2017 FINDINGS: CT CHEST FINDINGS Cardiovascular: The heart size is mildly enlarged. There is no pericardial effusion identified. Mediastinum/Nodes: Interval left axillary nodal dissection. There is a 9 mm enhancing left axillary lymph node identified, image 14 of series 2. Other previously noted lymph nodes have either resolved or have been resected. No enlarged supraclavicular or right axillary lymph nodes. The trachea appears patent and is midline. Normal appearance of the esophagus. No enlarged  mediastinal or hilar lymph nodes. Lungs/Pleura: No pleural effusion. New nodule within the left upper lobe measures 1.4 x 1.3 cm and has a central area of cavitation, image 66 of series 4. In the right middle lobe there is a nodule measure 7 mm, image 90 of series 4. Previously 4 mm. Musculoskeletal: Status post bilateral mastectomy and implant breast reconstruction. No aggressive lytic or sclerotic bone lesions. CT ABDOMEN PELVIS FINDINGS Hepatobiliary: Stable small low density structure and left lobe of liver which is too small to characterize measuring 4 mm, image 47 of series 2. Gallbladder normal. No biliary dilatation. Pancreas: Unremarkable. No pancreatic ductal dilatation or surrounding inflammatory changes. Spleen: Normal in size without focal abnormality. Adrenals/Urinary Tract: The adrenal glands are normal. Bilateral  nonobstructing renal calculi. No hydronephrosis or kidney mass. Urinary bladder appears normal. Stomach/Bowel: The stomach is unremarkable. The small bowel loops have a normal course and caliber. Unremarkable appearance of the colon. Vascular/Lymphatic: Normal appearance of the abdominal aorta. No enlarged lymph nodes within the abdomen or pelvis. Reproductive: Previous hysterectomy. Left ovary cyst noted measuring up to 1.7 cm. Other: There is no ascites or focal fluid collections within the abdomen or pelvis. Musculoskeletal: Scoliosis and degenerative disc disease. No aggressive lytic or sclerotic bone lesions. IMPRESSION: 1. New nodule in the left upper lobe with central cavitation is identified. Differential considerations includes inflammatory and infectious processes as well as metastatic disease or primary bronchogenic neoplasm. A second nodule, within the right middle lobe, has increased in size from previous exam. 2. Persistent left axillary lymph node is identified measuring 9 mm. No additional areas of adenopathy identified within the chest, abdomen or pelvis. Electronically Signed   By: Kerby Moors M.D.   On: 07/09/2017 14:33   Ct Abdomen Pelvis W Contrast  Result Date: 07/09/2017 CLINICAL DATA:  Metastatic breast cancer.  Restaging. EXAM: CT CHEST, ABDOMEN, AND PELVIS WITH CONTRAST TECHNIQUE: Multidetector CT imaging of the chest, abdomen and pelvis was performed following the standard protocol during bolus administration of intravenous contrast. CONTRAST:  149m ISOVUE-300 IOPAMIDOL (ISOVUE-300) INJECTION 61% COMPARISON:  PET-CT 04/03/2017 FINDINGS: CT CHEST FINDINGS Cardiovascular: The heart size is mildly enlarged. There is no pericardial effusion identified. Mediastinum/Nodes: Interval left axillary nodal dissection. There is a 9 mm enhancing left axillary lymph node identified, image 14 of series 2. Other previously noted lymph nodes have either resolved or have been resected. No enlarged  supraclavicular or right axillary lymph nodes. The trachea appears patent and is midline. Normal appearance of the esophagus. No enlarged mediastinal or hilar lymph nodes. Lungs/Pleura: No pleural effusion. New nodule within the left upper lobe measures 1.4 x 1.3 cm and has a central area of cavitation, image 66 of series 4. In the right middle lobe there is a nodule measure 7 mm, image 90 of series 4. Previously 4 mm. Musculoskeletal: Status post bilateral mastectomy and implant breast reconstruction. No aggressive lytic or sclerotic bone lesions. CT ABDOMEN PELVIS FINDINGS Hepatobiliary: Stable small low density structure and left lobe of liver which is too small to characterize measuring 4 mm, image 47 of series 2. Gallbladder normal. No biliary dilatation. Pancreas: Unremarkable. No pancreatic ductal dilatation or surrounding inflammatory changes. Spleen: Normal in size without focal abnormality. Adrenals/Urinary Tract: The adrenal glands are normal. Bilateral nonobstructing renal calculi. No hydronephrosis or kidney mass. Urinary bladder appears normal. Stomach/Bowel: The stomach is unremarkable. The small bowel loops have a normal course and caliber. Unremarkable appearance of the colon. Vascular/Lymphatic: Normal appearance of the abdominal aorta.  No enlarged lymph nodes within the abdomen or pelvis. Reproductive: Previous hysterectomy. Left ovary cyst noted measuring up to 1.7 cm. Other: There is no ascites or focal fluid collections within the abdomen or pelvis. Musculoskeletal: Scoliosis and degenerative disc disease. No aggressive lytic or sclerotic bone lesions. IMPRESSION: 1. New nodule in the left upper lobe with central cavitation is identified. Differential considerations includes inflammatory and infectious processes as well as metastatic disease or primary bronchogenic neoplasm. A second nodule, within the right middle lobe, has increased in size from previous exam. 2. Persistent left axillary  lymph node is identified measuring 9 mm. No additional areas of adenopathy identified within the chest, abdomen or pelvis. Electronically Signed   By: Kerby Moors M.D.   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    PLAN:    1. 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  She does have a new nodule in her left upper lobe that will require monitoring.  Previously, MRI of the brain was negative.  Patient's [id number] is now within normal limits at 20.8.  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 Patient's laboratory work is adequate to proceed with cycle 4, day 1 of Taxol and Herceptin. Will give treatments on days 1, 8, and 15 with day 22 off. Plan to reimage again at the conclusion of cycle 6 and then consider possible maintenance Herceptin therapy.  Return to clinic in 1 week for consideration of cycle 4, day 8.  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 2. Brain metastasis: Patient has completed XRT. Repeat brain imaging 3 months at the conclusion of cycle 6.  3. 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 Continue follow-up with cardiology as indicated. 4. Rash: Unclear etiology, possibly related to XRT. Isolated to her scalp and improving. Continue topical treatment.  Increase premedication of  dexamethasone to 20 mg IV with each treatment.  Patient expressed understanding and was in agreement with this plan. She also understands that She can call clinic at any time with any questions, concerns, or complaints.    Breast cancer   Staging form: Breast, AJCC 7th Edition     Clinical stage from 02/14/2015: Stage IA (T1c, N0, M0) - Signed by Lloyd Huger, MD on 02/14/2015   Lloyd Huger, MD   07/12/2017 1:23 PM

## 2017-07-10 ENCOUNTER — Inpatient Hospital Stay: Payer: 59

## 2017-07-10 ENCOUNTER — Inpatient Hospital Stay (HOSPITAL_BASED_OUTPATIENT_CLINIC_OR_DEPARTMENT_OTHER): Payer: 59 | Admitting: Oncology

## 2017-07-10 ENCOUNTER — Other Ambulatory Visit: Payer: 59

## 2017-07-10 VITALS — BP 111/77 | HR 88 | Temp 99.1°F | Resp 18 | Wt 205.6 lb

## 2017-07-10 DIAGNOSIS — Z79899 Other long term (current) drug therapy: Secondary | ICD-10-CM | POA: Diagnosis not present

## 2017-07-10 DIAGNOSIS — Z170 Estrogen receptor positive status [ER+]: Secondary | ICD-10-CM | POA: Diagnosis not present

## 2017-07-10 DIAGNOSIS — Z9013 Acquired absence of bilateral breasts and nipples: Secondary | ICD-10-CM | POA: Diagnosis not present

## 2017-07-10 DIAGNOSIS — F329 Major depressive disorder, single episode, unspecified: Secondary | ICD-10-CM | POA: Diagnosis not present

## 2017-07-10 DIAGNOSIS — C778 Secondary and unspecified malignant neoplasm of lymph nodes of multiple regions: Secondary | ICD-10-CM | POA: Diagnosis not present

## 2017-07-10 DIAGNOSIS — C50411 Malignant neoplasm of upper-outer quadrant of right female breast: Secondary | ICD-10-CM

## 2017-07-10 DIAGNOSIS — R21 Rash and other nonspecific skin eruption: Secondary | ICD-10-CM

## 2017-07-10 DIAGNOSIS — E039 Hypothyroidism, unspecified: Secondary | ICD-10-CM | POA: Diagnosis not present

## 2017-07-10 DIAGNOSIS — Z801 Family history of malignant neoplasm of trachea, bronchus and lung: Secondary | ICD-10-CM | POA: Diagnosis not present

## 2017-07-10 DIAGNOSIS — Z808 Family history of malignant neoplasm of other organs or systems: Secondary | ICD-10-CM

## 2017-07-10 DIAGNOSIS — C7931 Secondary malignant neoplasm of brain: Secondary | ICD-10-CM

## 2017-07-10 DIAGNOSIS — J45909 Unspecified asthma, uncomplicated: Secondary | ICD-10-CM | POA: Diagnosis not present

## 2017-07-10 DIAGNOSIS — Z800 Family history of malignant neoplasm of digestive organs: Secondary | ICD-10-CM

## 2017-07-10 DIAGNOSIS — Z923 Personal history of irradiation: Secondary | ICD-10-CM | POA: Diagnosis not present

## 2017-07-10 MED ORDER — SODIUM CHLORIDE 0.9 % IV SOLN
Freq: Once | INTRAVENOUS | Status: AC
  Filled 2017-07-10: qty 1000

## 2017-07-10 MED ORDER — HEPARIN SOD (PORK) LOCK FLUSH 100 UNIT/ML IV SOLN
500.0000 [IU] | Freq: Once | INTRAVENOUS | Status: AC | PRN
  Administered 2017-07-10: 500 [IU]
  Filled 2017-07-10: qty 5

## 2017-07-10 MED ORDER — SODIUM CHLORIDE 0.9% FLUSH
10.0000 mL | INTRAVENOUS | Status: DC | PRN
  Administered 2017-07-10: 10 mL
  Filled 2017-07-10: qty 10

## 2017-07-10 MED ORDER — FAMOTIDINE IN NACL 20-0.9 MG/50ML-% IV SOLN
20.0000 mg | Freq: Once | INTRAVENOUS | Status: AC
  Administered 2017-07-10: 20 mg via INTRAVENOUS
  Filled 2017-07-10: qty 50

## 2017-07-10 MED ORDER — ACETAMINOPHEN 325 MG PO TABS
650.0000 mg | ORAL_TABLET | Freq: Once | ORAL | Status: AC
  Administered 2017-07-10: 650 mg via ORAL
  Filled 2017-07-10: qty 2

## 2017-07-10 MED ORDER — SODIUM CHLORIDE 0.9 % IV SOLN
Freq: Once | INTRAVENOUS | Status: AC
  Administered 2017-07-10: 11:00:00 via INTRAVENOUS
  Filled 2017-07-10: qty 1000

## 2017-07-10 MED ORDER — TRASTUZUMAB CHEMO 150 MG IV SOLR
2.0000 mg/kg | Freq: Once | INTRAVENOUS | Status: AC
  Administered 2017-07-10: 189 mg via INTRAVENOUS
  Filled 2017-07-10: qty 9

## 2017-07-10 MED ORDER — DIPHENHYDRAMINE HCL 50 MG/ML IJ SOLN
25.0000 mg | Freq: Once | INTRAMUSCULAR | Status: AC
  Administered 2017-07-10: 25 mg via INTRAVENOUS
  Filled 2017-07-10: qty 1

## 2017-07-10 MED ORDER — DEXAMETHASONE SODIUM PHOSPHATE 10 MG/ML IJ SOLN: 20.0000 mg | Freq: Once | INTRAMUSCULAR | Status: DC

## 2017-07-10 MED ORDER — PACLITAXEL CHEMO INJECTION 300 MG/50ML
80.0000 mg/m2 | Freq: Once | INTRAVENOUS | Status: AC
  Administered 2017-07-10: 168 mg via INTRAVENOUS
  Filled 2017-07-10: qty 28

## 2017-07-10 MED ORDER — SODIUM CHLORIDE 0.9 % IV SOLN
20.0000 mg | Freq: Once | INTRAVENOUS | Status: AC
  Administered 2017-07-10: 20 mg via INTRAVENOUS
  Filled 2017-07-10: qty 2

## 2017-07-14 NOTE — Progress Notes (Signed)
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   INTERVAL HISTORY: Patient returns to clinic today for further evaluation and consideration of cycle 4, day 8 of Taxol and Herceptin. She has worsening congestion and cough, but denies fevers.  She continues to have an erythematous rash which is isolated to her scalp for approximately 24-48 hours after her treatment which then resolves with topical cream. Her rash was improved with increasing her dexamethasone premedication.  She is otherwise tolerating her treatments well without significant side effects. She has no further shortness of breath.  She has no neurologic complaints. She denies any recent fevers or illnesses. She has a good appetite and has maintained her weight.  She has no chest pain.  She has no nausea, vomiting, constipation, or diarrhea.  She has no urinary complaints. Patient offers no further specific complaints today.  REVIEW OF SYSTEMS:   Review of Systems  Constitutional: Negative.  Negative for fever, malaise/fatigue and weight loss.  HENT: Positive for congestion.   Respiratory: Positive for cough. Negative for shortness of breath.   Cardiovascular: Negative.  Negative for chest pain and leg swelling.  Gastrointestinal: Negative.  Negative for abdominal pain.  Genitourinary: Negative.   Musculoskeletal: Negative.   Skin: Positive for rash. Negative for itching.  Neurological: Negative.  Negative for sensory change and weakness.  Psychiatric/Behavioral: The patient is  nervous/anxious.    As per HPI. Otherwise, a complete review of systems is negative.   PAST MEDICAL HISTORY:  Hypothyroidism, migraines, depression, asthma.  PAST SURGICAL HISTORY: Bilateral mastectomy with reconstruction, partial hysterectomy.  FAMILY HISTORY: Lung cancer, melanoma, stomach cancer.  Also diabetes, CAD, hypertension     ADVANCED DIRECTIVES:    HEALTH MAINTENANCE: Social History   Tobacco Use  . Smoking status: Never Smoker  . Smokeless tobacco: Never Used  Substance Use Topics  . Alcohol use: No  . Drug use: No     Colonoscopy:  PAP:  Bone density:  Lipid panel:  Allergies  Allergen Reactions  . Codeine Other (See Comments)    constipation  . Sulfa Antibiotics Other (See Comments)    Allergy as a child    Current Outpatient Medications  Medication Sig Dispense Refill  . alendronate (FOSAMAX) 70 MG tablet Take 70 mg by mouth once a week. Take with a full glass of water on an empty stomach.     . ALPRAZolam (XANAX) 0.25 MG tablet Take 1 tablet by mouth 3 (three) times daily as needed for anxiety.     Marland Kitchen azelastine (ASTELIN) 0.1 % nasal spray Place 1 spray into both nostrils daily as needed for rhinitis. Use in each nostril as directed    . calcium-vitamin D (OSCAL WITH D) 250-125 MG-UNIT tablet Take 1 tablet by mouth daily.    . Cholecalciferol (D3 HIGH POTENCY) 2000 units CAPS Take 1 capsule by mouth daily.    . cyclobenzaprine (FLEXERIL) 10 MG tablet Take 10 mg by mouth 3 (three) times daily as needed.     . DULoxetine (CYMBALTA) 30 MG capsule Take 90 mg by mouth every morning.     Marland Kitchen  fluticasone (FLOVENT HFA) 110 MCG/ACT inhaler Inhale 1 puff into the lungs 2 (two) times daily.    Marland Kitchen levothyroxine (SYNTHROID, LEVOTHROID) 137 MCG tablet Take 137 mcg by mouth daily before breakfast.     . lidocaine-prilocaine (EMLA) cream Apply to affected area once 30 g 3  . metoprolol succinate (TOPROL-XL) 25 MG 24 hr tablet Take 75 mg by mouth at bedtime.     .  Multiple Vitamin (MULTIVITAMIN) tablet Take 1 tablet by mouth daily.    . ondansetron (ZOFRAN) 8 MG tablet Take 1 tablet (8 mg total) by mouth 2 (two) times daily as needed (Nausea or vomiting). 60 tablet 3  . prochlorperazine (COMPAZINE) 10 MG tablet Take 1 tablet (10 mg total) by mouth every 6 (six) hours as needed (Nausea or vomiting). 60 tablet 3  . topiramate (TOPAMAX) 50 MG tablet Take 50 mg by mouth at bedtime.     . traMADol (ULTRAM) 50 MG tablet Take 25-50 mg by mouth every 6 (six) hours as needed.     . Triamcinolone Acetonide (TRIAMCINOLONE 0.1 % CREAM : EUCERIN) CREA Apply 1 application topically 2 (two) times daily as needed. 1 each 0  . amoxicillin-clavulanate (AUGMENTIN) 875-125 MG tablet Take 1 tablet by mouth 2 (two) times daily. 14 tablet 0   No current facility-administered medications for this visit.     OBJECTIVE: Vitals:   07/17/17 0906  BP: 110/62  Pulse: 96  Resp: 18  Temp: 97.7 F (36.5 C)     Body mass index is 36.54 kg/m.    ECOG FS:0 - Asymptomatic  General: Well-developed, well-nourished, no acute distress. Eyes: anicteric sclera. Breasts: Patient has bilateral mastectomies with reconstruction, no obvious evidence of local recurrence. Lungs: Clear to auscultation bilaterally. Port in place in the right chest wall. Heart: Regular rate and rhythm. No rubs, murmurs, or gallops. Abdomen: Soft, nontender, nondistended. No organomegaly noted, normoactive bowel sounds. Musculoskeletal: No edema, cyanosis, or clubbing. Neuro: Alert, answering all questions appropriately. Cranial nerves grossly intact. Skin: Mild erythema noted on scalp and torso. Psych: Normal affect.   LAB RESULTS:  Lab Results  Component Value Date   NA 140 07/15/2017   K 3.8 07/15/2017   CL 110 (H) 07/15/2017   CO2 15 (L) 07/15/2017   GLUCOSE 104 (H) 07/15/2017   BUN 12 07/15/2017   CREATININE 0.78 07/15/2017   CALCIUM 9.1 07/15/2017   PROT 5.8 (L) 07/15/2017   ALBUMIN 3.8  07/15/2017   AST 21 07/15/2017   ALT 17 07/15/2017   ALKPHOS 72 07/15/2017   BILITOT 0.4 07/15/2017   GFRNONAA 82 07/15/2017   GFRAA 95 07/15/2017    Lab Results  Component Value Date   WBC 5.7 07/15/2017   NEUTROABS 4.9 07/15/2017   HGB 12.1 07/15/2017   HCT 37.4 07/15/2017   MCV 97 07/15/2017   PLT 349 07/15/2017     STUDIES: Ct Chest W Contrast  Result Date: 07/09/2017 CLINICAL DATA:  Metastatic breast cancer.  Restaging. EXAM: CT CHEST, ABDOMEN, AND PELVIS WITH CONTRAST TECHNIQUE: Multidetector CT imaging of the chest, abdomen and pelvis was performed following the standard protocol during bolus administration of intravenous contrast. CONTRAST:  191m ISOVUE-300 IOPAMIDOL (ISOVUE-300) INJECTION 61% COMPARISON:  PET-CT 04/03/2017 FINDINGS: CT CHEST FINDINGS Cardiovascular: The heart size is mildly enlarged. There is no pericardial effusion identified. Mediastinum/Nodes: Interval left axillary nodal dissection. There is a 9 mm enhancing left axillary lymph node identified, image 14 of series 2. Other previously noted lymph nodes have either resolved or  have been resected. No enlarged supraclavicular or right axillary lymph nodes. The trachea appears patent and is midline. Normal appearance of the esophagus. No enlarged mediastinal or hilar lymph nodes. Lungs/Pleura: No pleural effusion. New nodule within the left upper lobe measures 1.4 x 1.3 cm and has a central area of cavitation, image 66 of series 4. In the right middle lobe there is a nodule measure 7 mm, image 90 of series 4. Previously 4 mm. Musculoskeletal: Status post bilateral mastectomy and implant breast reconstruction. No aggressive lytic or sclerotic bone lesions. CT ABDOMEN PELVIS FINDINGS Hepatobiliary: Stable small low density structure and left lobe of liver which is too small to characterize measuring 4 mm, image 47 of series 2. Gallbladder normal. No biliary dilatation. Pancreas: Unremarkable. No pancreatic ductal  dilatation or surrounding inflammatory changes. Spleen: Normal in size without focal abnormality. Adrenals/Urinary Tract: The adrenal glands are normal. Bilateral nonobstructing renal calculi. No hydronephrosis or kidney mass. Urinary bladder appears normal. Stomach/Bowel: The stomach is unremarkable. The small bowel loops have a normal course and caliber. Unremarkable appearance of the colon. Vascular/Lymphatic: Normal appearance of the abdominal aorta. No enlarged lymph nodes within the abdomen or pelvis. Reproductive: Previous hysterectomy. Left ovary cyst noted measuring up to 1.7 cm. Other: There is no ascites or focal fluid collections within the abdomen or pelvis. Musculoskeletal: Scoliosis and degenerative disc disease. No aggressive lytic or sclerotic bone lesions. IMPRESSION: 1. New nodule in the left upper lobe with central cavitation is identified. Differential considerations includes inflammatory and infectious processes as well as metastatic disease or primary bronchogenic neoplasm. A second nodule, within the right middle lobe, has increased in size from previous exam. 2. Persistent left axillary lymph node is identified measuring 9 mm. No additional areas of adenopathy identified within the chest, abdomen or pelvis. Electronically Signed   By: Kerby Moors M.D.   On: 07/09/2017 14:33   Ct Abdomen Pelvis W Contrast  Result Date: 07/09/2017 CLINICAL DATA:  Metastatic breast cancer.  Restaging. EXAM: CT CHEST, ABDOMEN, AND PELVIS WITH CONTRAST TECHNIQUE: Multidetector CT imaging of the chest, abdomen and pelvis was performed following the standard protocol during bolus administration of intravenous contrast. CONTRAST:  198m ISOVUE-300 IOPAMIDOL (ISOVUE-300) INJECTION 61% COMPARISON:  PET-CT 04/03/2017 FINDINGS: CT CHEST FINDINGS Cardiovascular: The heart size is mildly enlarged. There is no pericardial effusion identified. Mediastinum/Nodes: Interval left axillary nodal dissection. There is a 9  mm enhancing left axillary lymph node identified, image 14 of series 2. Other previously noted lymph nodes have either resolved or have been resected. No enlarged supraclavicular or right axillary lymph nodes. The trachea appears patent and is midline. Normal appearance of the esophagus. No enlarged mediastinal or hilar lymph nodes. Lungs/Pleura: No pleural effusion. New nodule within the left upper lobe measures 1.4 x 1.3 cm and has a central area of cavitation, image 66 of series 4. In the right middle lobe there is a nodule measure 7 mm, image 90 of series 4. Previously 4 mm. Musculoskeletal: Status post bilateral mastectomy and implant breast reconstruction. No aggressive lytic or sclerotic bone lesions. CT ABDOMEN PELVIS FINDINGS Hepatobiliary: Stable small low density structure and left lobe of liver which is too small to characterize measuring 4 mm, image 47 of series 2. Gallbladder normal. No biliary dilatation. Pancreas: Unremarkable. No pancreatic ductal dilatation or surrounding inflammatory changes. Spleen: Normal in size without focal abnormality. Adrenals/Urinary Tract: The adrenal glands are normal. Bilateral nonobstructing renal calculi. No hydronephrosis or kidney mass. Urinary bladder appears normal. Stomach/Bowel: The  stomach is unremarkable. The small bowel loops have a normal course and caliber. Unremarkable appearance of the colon. Vascular/Lymphatic: Normal appearance of the abdominal aorta. No enlarged lymph nodes within the abdomen or pelvis. Reproductive: Previous hysterectomy. Left ovary cyst noted measuring up to 1.7 cm. Other: There is no ascites or focal fluid collections within the abdomen or pelvis. Musculoskeletal: Scoliosis and degenerative disc disease. No aggressive lytic or sclerotic bone lesions. IMPRESSION: 1. New nodule in the left upper lobe with central cavitation is identified. Differential considerations includes inflammatory and infectious processes as well as metastatic  disease or primary bronchogenic neoplasm. A second nodule, within the right middle lobe, has increased in size from previous exam. 2. Persistent left axillary lymph node is identified measuring 9 mm. No additional areas of adenopathy identified within the chest, abdomen or pelvis. Electronically Signed   By: Kerby Moors M.D.   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    PLAN:    1. 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  She does have a new nodule in her left upper lobe that will require monitoring.  Previously, MRI of the brain was negative.  Patient's CA-27.29 is now within normal limits.  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 Patient's laboratory work is adequate to proceed with cycle 4, day 8 of Taxol and Herceptin. Will give treatments on days 1, 8, and 15 with day 22 off. Plan to reimage again at the conclusion of cycle 6 and then consider possible maintenance Herceptin therapy.  Return to clinic in 1 week for consideration of cycle 4, day 15.  Patient will require repeat MUGA prior to the initiation of cycle 5.  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 2. Brain metastasis: Patient has completed XRT. Repeat brain imaging 3 months at the conclusion of cycle 6.  3. 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 Continue follow-up with cardiology as indicated. 4. Rash: Unclear etiology, possibly related to XRT. Isolated to her scalp and improving. Continue topical treatment.  Increase premedication of dexamethasone to 20 mg IV with each treatment.  5.  Cough/congestion: Patient was given a prescription for 500 mg Levaquin x7 days.  Patient expressed understanding and was in agreement with this plan. She also understands that She can call clinic at any time with any questions, concerns, or complaints.    Breast cancer   Staging form: Breast, AJCC 7th Edition     Clinical stage from 02/14/2015: Stage IA (T1c, N0, M0) - Signed by Lloyd Huger, MD on 02/14/2015   Lloyd Huger, MD   07/17/2017 2:06 PM

## 2017-07-15 ENCOUNTER — Other Ambulatory Visit: Payer: Self-pay | Admitting: Oncology

## 2017-07-15 DIAGNOSIS — C50411 Malignant neoplasm of upper-outer quadrant of right female breast: Secondary | ICD-10-CM | POA: Diagnosis not present

## 2017-07-16 LAB — CBC WITH DIFFERENTIAL/PLATELET
BASOS ABS: 0 10*3/uL (ref 0.0–0.2)
Basos: 0 %
EOS (ABSOLUTE): 0.1 10*3/uL (ref 0.0–0.4)
Eos: 2 %
Hematocrit: 37.4 % (ref 34.0–46.6)
Hemoglobin: 12.1 g/dL (ref 11.1–15.9)
IMMATURE GRANS (ABS): 0 10*3/uL (ref 0.0–0.1)
Immature Granulocytes: 0 %
LYMPHS: 10 %
Lymphocytes Absolute: 0.6 10*3/uL — ABNORMAL LOW (ref 0.7–3.1)
MCH: 31.5 pg (ref 26.6–33.0)
MCHC: 32.4 g/dL (ref 31.5–35.7)
MCV: 97 fL (ref 79–97)
MONOCYTES: 2 %
Monocytes Absolute: 0.1 10*3/uL (ref 0.1–0.9)
NEUTROS ABS: 4.9 10*3/uL (ref 1.4–7.0)
Neutrophils: 86 %
PLATELETS: 349 10*3/uL (ref 150–379)
RBC: 3.84 x10E6/uL (ref 3.77–5.28)
RDW: 15.2 % (ref 12.3–15.4)
WBC: 5.7 10*3/uL (ref 3.4–10.8)

## 2017-07-16 LAB — COMPREHENSIVE METABOLIC PANEL
ALK PHOS: 72 IU/L (ref 39–117)
ALT: 17 IU/L (ref 0–32)
AST: 21 IU/L (ref 0–40)
Albumin/Globulin Ratio: 1.9 (ref 1.2–2.2)
Albumin: 3.8 g/dL (ref 3.6–4.8)
BUN/Creatinine Ratio: 15 (ref 12–28)
BUN: 12 mg/dL (ref 8–27)
Bilirubin Total: 0.4 mg/dL (ref 0.0–1.2)
CHLORIDE: 110 mmol/L — AB (ref 96–106)
CO2: 15 mmol/L — AB (ref 20–29)
CREATININE: 0.78 mg/dL (ref 0.57–1.00)
Calcium: 9.1 mg/dL (ref 8.7–10.3)
GFR calc Af Amer: 95 mL/min/{1.73_m2} (ref >59)
GFR calc non Af Amer: 82 mL/min/{1.73_m2} (ref >59)
GLUCOSE: 104 mg/dL — AB (ref 65–99)
Globulin, Total: 2 g/dL (ref 1.5–4.5)
Potassium: 3.8 mmol/L (ref 3.5–5.2)
Sodium: 140 mmol/L (ref 134–144)
Total Protein: 5.8 g/dL — ABNORMAL LOW (ref 6.0–8.5)

## 2017-07-17 ENCOUNTER — Other Ambulatory Visit: Payer: Self-pay

## 2017-07-17 ENCOUNTER — Inpatient Hospital Stay: Payer: 59

## 2017-07-17 ENCOUNTER — Inpatient Hospital Stay: Payer: 59 | Attending: Oncology | Admitting: Oncology

## 2017-07-17 ENCOUNTER — Encounter: Payer: Self-pay | Admitting: Oncology

## 2017-07-17 ENCOUNTER — Other Ambulatory Visit: Payer: Self-pay | Admitting: Oncology

## 2017-07-17 VITALS — BP 110/62 | HR 96 | Temp 97.7°F | Resp 18 | Wt 206.3 lb

## 2017-07-17 DIAGNOSIS — R05 Cough: Secondary | ICD-10-CM | POA: Insufficient documentation

## 2017-07-17 DIAGNOSIS — N200 Calculus of kidney: Secondary | ICD-10-CM | POA: Insufficient documentation

## 2017-07-17 DIAGNOSIS — Z923 Personal history of irradiation: Secondary | ICD-10-CM | POA: Diagnosis not present

## 2017-07-17 DIAGNOSIS — C7989 Secondary malignant neoplasm of other specified sites: Secondary | ICD-10-CM | POA: Insufficient documentation

## 2017-07-17 DIAGNOSIS — Z5112 Encounter for antineoplastic immunotherapy: Secondary | ICD-10-CM | POA: Insufficient documentation

## 2017-07-17 DIAGNOSIS — Z170 Estrogen receptor positive status [ER+]: Secondary | ICD-10-CM | POA: Insufficient documentation

## 2017-07-17 DIAGNOSIS — Z800 Family history of malignant neoplasm of digestive organs: Secondary | ICD-10-CM | POA: Diagnosis not present

## 2017-07-17 DIAGNOSIS — Z8669 Personal history of other diseases of the nervous system and sense organs: Secondary | ICD-10-CM | POA: Insufficient documentation

## 2017-07-17 DIAGNOSIS — N83202 Unspecified ovarian cyst, left side: Secondary | ICD-10-CM | POA: Diagnosis not present

## 2017-07-17 DIAGNOSIS — C7931 Secondary malignant neoplasm of brain: Secondary | ICD-10-CM | POA: Diagnosis not present

## 2017-07-17 DIAGNOSIS — C50411 Malignant neoplasm of upper-outer quadrant of right female breast: Secondary | ICD-10-CM

## 2017-07-17 DIAGNOSIS — Z801 Family history of malignant neoplasm of trachea, bronchus and lung: Secondary | ICD-10-CM

## 2017-07-17 DIAGNOSIS — R21 Rash and other nonspecific skin eruption: Secondary | ICD-10-CM | POA: Diagnosis not present

## 2017-07-17 DIAGNOSIS — Z9013 Acquired absence of bilateral breasts and nipples: Secondary | ICD-10-CM | POA: Insufficient documentation

## 2017-07-17 DIAGNOSIS — C778 Secondary and unspecified malignant neoplasm of lymph nodes of multiple regions: Secondary | ICD-10-CM | POA: Insufficient documentation

## 2017-07-17 DIAGNOSIS — Z5111 Encounter for antineoplastic chemotherapy: Secondary | ICD-10-CM | POA: Insufficient documentation

## 2017-07-17 DIAGNOSIS — E039 Hypothyroidism, unspecified: Secondary | ICD-10-CM | POA: Diagnosis not present

## 2017-07-17 DIAGNOSIS — J45909 Unspecified asthma, uncomplicated: Secondary | ICD-10-CM | POA: Insufficient documentation

## 2017-07-17 DIAGNOSIS — F329 Major depressive disorder, single episode, unspecified: Secondary | ICD-10-CM | POA: Insufficient documentation

## 2017-07-17 MED ORDER — HEPARIN SOD (PORK) LOCK FLUSH 100 UNIT/ML IV SOLN
500.0000 [IU] | Freq: Once | INTRAVENOUS | Status: AC | PRN
  Administered 2017-07-17: 500 [IU]

## 2017-07-17 MED ORDER — PACLITAXEL CHEMO INJECTION 300 MG/50ML
80.0000 mg/m2 | Freq: Once | INTRAVENOUS | Status: AC
  Administered 2017-07-17: 168 mg via INTRAVENOUS
  Filled 2017-07-17: qty 28

## 2017-07-17 MED ORDER — SODIUM CHLORIDE 0.9 % IV SOLN
20.0000 mg | Freq: Once | INTRAVENOUS | Status: AC
  Administered 2017-07-17: 20 mg via INTRAVENOUS
  Filled 2017-07-17: qty 2

## 2017-07-17 MED ORDER — FAMOTIDINE IN NACL 20-0.9 MG/50ML-% IV SOLN
20.0000 mg | Freq: Once | INTRAVENOUS | Status: AC
  Administered 2017-07-17: 20 mg via INTRAVENOUS
  Filled 2017-07-17: qty 50

## 2017-07-17 MED ORDER — TRASTUZUMAB CHEMO 150 MG IV SOLR
2.0000 mg/kg | Freq: Once | INTRAVENOUS | Status: AC
  Administered 2017-07-17: 189 mg via INTRAVENOUS
  Filled 2017-07-17: qty 9

## 2017-07-17 MED ORDER — DEXAMETHASONE SODIUM PHOSPHATE 10 MG/ML IJ SOLN: 20.0000 mg | Freq: Once | INTRAMUSCULAR | Status: DC

## 2017-07-17 MED ORDER — LEVOFLOXACIN 500 MG PO TABS: 500.0000 mg | ORAL_TABLET | Freq: Every day | ORAL | 0 refills | Status: DC

## 2017-07-17 MED ORDER — ACETAMINOPHEN 325 MG PO TABS
650.0000 mg | ORAL_TABLET | Freq: Once | ORAL | Status: AC
  Administered 2017-07-17: 650 mg via ORAL
  Filled 2017-07-17: qty 2

## 2017-07-17 MED ORDER — SODIUM CHLORIDE 0.9 % IV SOLN
Freq: Once | INTRAVENOUS | Status: AC
  Administered 2017-07-17: 10:00:00 via INTRAVENOUS
  Filled 2017-07-17: qty 1000

## 2017-07-17 MED ORDER — AMOXICILLIN-POT CLAVULANATE 875-125 MG PO TABS: 1.0000 | ORAL_TABLET | Freq: Two times a day (BID) | ORAL | 0 refills | Status: DC

## 2017-07-17 MED ORDER — DIPHENHYDRAMINE HCL 50 MG/ML IJ SOLN
25.0000 mg | Freq: Once | INTRAMUSCULAR | Status: AC
  Administered 2017-07-17: 25 mg via INTRAVENOUS
  Filled 2017-07-17: qty 1

## 2017-07-17 NOTE — Progress Notes (Signed)
Patient reports ongoing head cold/congestion, states she has at times coughed up green and bloody mucus.

## 2017-07-18 ENCOUNTER — Encounter: Payer: Self-pay | Admitting: Oncology

## 2017-07-23 ENCOUNTER — Inpatient Hospital Stay: Payer: 59

## 2017-07-23 DIAGNOSIS — C50411 Malignant neoplasm of upper-outer quadrant of right female breast: Secondary | ICD-10-CM | POA: Diagnosis not present

## 2017-07-23 LAB — COMPREHENSIVE METABOLIC PANEL
ALBUMIN: 4 g/dL (ref 3.5–5.0)
ALK PHOS: 68 U/L (ref 38–126)
ALT: 18 U/L (ref 14–54)
AST: 26 U/L (ref 15–41)
Anion gap: 10 (ref 5–15)
BUN: 13 mg/dL (ref 6–20)
CALCIUM: 9.6 mg/dL (ref 8.9–10.3)
CHLORIDE: 107 mmol/L (ref 101–111)
CO2: 19 mmol/L — AB (ref 22–32)
CREATININE: 0.9 mg/dL (ref 0.44–1.00)
GFR calc Af Amer: >60 mL/min (ref >60)
GFR calc non Af Amer: >60 mL/min (ref >60)
GLUCOSE: 117 mg/dL — AB (ref 65–99)
Potassium: 3.6 mmol/L (ref 3.5–5.1)
SODIUM: 136 mmol/L (ref 135–145)
Total Bilirubin: 0.6 mg/dL (ref 0.3–1.2)
Total Protein: 7.4 g/dL (ref 6.5–8.1)

## 2017-07-23 LAB — CBC WITH DIFFERENTIAL/PLATELET
BASOS ABS: 0 10*3/uL (ref 0–0.1)
Basophils Relative: 1 %
EOS ABS: 0.1 10*3/uL (ref 0–0.7)
EOS PCT: 3 %
HCT: 36.3 % (ref 35.0–47.0)
HEMOGLOBIN: 12.4 g/dL (ref 12.0–16.0)
LYMPHS ABS: 0.7 10*3/uL — AB (ref 1.0–3.6)
LYMPHS PCT: 18 %
MCH: 33 pg (ref 26.0–34.0)
MCHC: 34.2 g/dL (ref 32.0–36.0)
MCV: 96.7 fL (ref 80.0–100.0)
Monocytes Absolute: 0.2 10*3/uL (ref 0.2–0.9)
Monocytes Relative: 5 %
NEUTROS PCT: 73 %
Neutro Abs: 2.8 10*3/uL (ref 1.4–6.5)
PLATELETS: 331 10*3/uL (ref 150–440)
RBC: 3.76 MIL/uL — AB (ref 3.80–5.20)
RDW: 14.9 % — ABNORMAL HIGH (ref 11.5–14.5)
WBC: 3.8 10*3/uL (ref 3.6–11.0)

## 2017-07-23 NOTE — Progress Notes (Signed)
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   INTERVAL HISTORY: Patient returns to clinic today for further evaluation and consideration of cycle 4, day 15 of Taxol and Herceptin.  Her congestion and cough have significantly improved.  She continues to have scalp erythema isolated to her scalp for approximately 24-48 hours after her treatment which then resolves with topical cream. Her rash was improved with increasing her dexamethasone premedication.  She is otherwise tolerating her treatments well without significant side effects. She has no further shortness of breath.  She has no neurologic complaints. She denies any recent fevers or illnesses. She has a good appetite and has maintained her weight.  She has no chest pain.  She has no nausea, vomiting, constipation, or diarrhea.  She has no urinary complaints. Patient offers no further specific complaints today.  REVIEW OF SYSTEMS:   Review of Systems  Constitutional: Negative.  Negative for fever, malaise/fatigue and weight loss.  HENT: Negative.  Negative for congestion.   Respiratory: Negative.  Negative for cough and shortness of breath.   Cardiovascular: Negative.  Negative for chest pain and leg swelling.  Gastrointestinal: Negative.  Negative for abdominal pain.  Genitourinary: Negative.   Musculoskeletal: Negative.   Skin: Positive for rash. Negative for itching.  Neurological: Negative.  Negative for sensory change and weakness.  Psychiatric/Behavioral: The patient is nervous/anxious.      As per HPI. Otherwise, a complete review of systems is negative.   PAST MEDICAL HISTORY:  Hypothyroidism, migraines, depression, asthma.  PAST SURGICAL HISTORY: Bilateral mastectomy with reconstruction, partial hysterectomy.  FAMILY HISTORY: Lung cancer, melanoma, stomach cancer.  Also diabetes, CAD, hypertension     ADVANCED DIRECTIVES:    HEALTH MAINTENANCE: Social History   Tobacco Use  . Smoking status: Never Smoker  . Smokeless tobacco: Never Used  Substance Use Topics  . Alcohol use: No  . Drug use: No     Colonoscopy:  PAP:  Bone density:  Lipid panel:  Allergies  Allergen Reactions  . Codeine Other (See Comments)    constipation  . Sulfa Antibiotics Other (See Comments)    Allergy as a child    Current Outpatient Medications  Medication Sig Dispense Refill  . alendronate (FOSAMAX) 70 MG tablet Take 70 mg by mouth once a week. Take with a full glass of water on an empty stomach.     . ALPRAZolam (XANAX) 0.25 MG tablet Take 1 tablet by mouth 3 (three) times daily as needed for anxiety.     Marland Kitchen amoxicillin-clavulanate (AUGMENTIN) 875-125 MG tablet Take 1 tablet by mouth 2 (two) times daily. 14 tablet 0  . azelastine (ASTELIN) 0.1 % nasal spray Place 1 spray into both nostrils daily as needed for rhinitis. Use in each nostril as directed    . calcium-vitamin D (OSCAL WITH D) 250-125 MG-UNIT tablet Take 1 tablet by mouth daily.    . Cholecalciferol (D3 HIGH POTENCY) 2000 units CAPS Take 1 capsule by mouth daily.    . cyclobenzaprine (FLEXERIL) 10 MG tablet Take 10 mg by mouth 3 (three) times daily as needed.     Marland Kitchen  DULoxetine (CYMBALTA) 30 MG capsule Take 90 mg by mouth every morning.     . fluticasone (FLOVENT HFA) 110 MCG/ACT inhaler Inhale 1 puff into the lungs 2 (two) times daily.    Marland Kitchen levothyroxine (SYNTHROID, LEVOTHROID) 137 MCG tablet Take 137 mcg by mouth daily before breakfast.     . lidocaine-prilocaine (EMLA) cream Apply to affected area once 30 g 3  .  metoprolol succinate (TOPROL-XL) 25 MG 24 hr tablet Take 75 mg by mouth at bedtime.     . Multiple Vitamin (MULTIVITAMIN) tablet Take 1 tablet by mouth daily.    . ondansetron (ZOFRAN) 8 MG tablet Take 1 tablet (8 mg total) by mouth 2 (two) times daily as needed (Nausea or vomiting). 60 tablet 3  . prochlorperazine (COMPAZINE) 10 MG tablet Take 1 tablet (10 mg total) by mouth every 6 (six) hours as needed (Nausea or vomiting). 60 tablet 3  . topiramate (TOPAMAX) 50 MG tablet Take 50 mg by mouth at bedtime.     . traMADol (ULTRAM) 50 MG tablet Take 25-50 mg by mouth every 6 (six) hours as needed.     . Triamcinolone Acetonide (TRIAMCINOLONE 0.1 % CREAM : EUCERIN) CREA Apply 1 application topically 2 (two) times daily as needed. 1 each 0   No current facility-administered medications for this visit.     OBJECTIVE: Vitals:   07/24/17 1002  BP: 96/66  Pulse: 96  Resp: 18  Temp: (!) 96.8 F (36 C)     Body mass index is 35.73 kg/m.    ECOG FS:0 - Asymptomatic  General: Well-developed, well-nourished, no acute distress. Eyes: anicteric sclera. Breasts: Patient has bilateral mastectomies with reconstruction, no obvious evidence of local recurrence. Lungs: Clear to auscultation bilaterally. Port in place in the right chest wall. Heart: Regular rate and rhythm. No rubs, murmurs, or gallops. Abdomen: Soft, nontender, nondistended. No organomegaly noted, normoactive bowel sounds. Musculoskeletal: No edema, cyanosis, or clubbing. Neuro: Alert, answering all questions appropriately. Cranial nerves grossly intact. Skin: Mild erythema noted on scalp and torso. Psych: Normal affect.   LAB RESULTS:  Lab Results  Component Value Date   NA 136 07/23/2017   K 3.6 07/23/2017   CL 107 07/23/2017   CO2 19 (L) 07/23/2017   GLUCOSE 117 (H) 07/23/2017   BUN 13 07/23/2017   CREATININE 0.90 07/23/2017   CALCIUM 9.6 07/23/2017   PROT 7.4 07/23/2017   ALBUMIN 4.0 07/23/2017   AST 26 07/23/2017    ALT 18 07/23/2017   ALKPHOS 68 07/23/2017   BILITOT 0.6 07/23/2017   GFRNONAA >60 07/23/2017   GFRAA >60 07/23/2017    Lab Results  Component Value Date   WBC 3.8 07/23/2017   NEUTROABS 2.8 07/23/2017   HGB 12.4 07/23/2017   HCT 36.3 07/23/2017   MCV 96.7 07/23/2017   PLT 331 07/23/2017     STUDIES: Ct Chest W Contrast  Result Date: 07/09/2017 CLINICAL DATA:  Metastatic breast cancer.  Restaging. EXAM: CT CHEST, ABDOMEN, AND PELVIS WITH CONTRAST TECHNIQUE: Multidetector CT imaging of the chest, abdomen and pelvis was performed following the standard protocol during bolus administration of intravenous contrast. CONTRAST:  153m ISOVUE-300 IOPAMIDOL (ISOVUE-300) INJECTION 61% COMPARISON:  PET-CT 04/03/2017 FINDINGS: CT CHEST FINDINGS Cardiovascular: The heart size is mildly enlarged. There is no pericardial effusion identified. Mediastinum/Nodes: Interval left axillary nodal dissection. There is a 9 mm enhancing left axillary lymph node identified, image 14 of series 2. Other previously noted lymph nodes have either resolved or have been resected.  No enlarged supraclavicular or right axillary lymph nodes. The trachea appears patent and is midline. Normal appearance of the esophagus. No enlarged mediastinal or hilar lymph nodes. Lungs/Pleura: No pleural effusion. New nodule within the left upper lobe measures 1.4 x 1.3 cm and has a central area of cavitation, image 66 of series 4. In the right middle lobe there is a nodule measure 7 mm, image 90 of series 4. Previously 4 mm. Musculoskeletal: Status post bilateral mastectomy and implant breast reconstruction. No aggressive lytic or sclerotic bone lesions. CT ABDOMEN PELVIS FINDINGS Hepatobiliary: Stable small low density structure and left lobe of liver which is too small to characterize measuring 4 mm, image 47 of series 2. Gallbladder normal. No biliary dilatation. Pancreas: Unremarkable. No pancreatic ductal dilatation or surrounding  inflammatory changes. Spleen: Normal in size without focal abnormality. Adrenals/Urinary Tract: The adrenal glands are normal. Bilateral nonobstructing renal calculi. No hydronephrosis or kidney mass. Urinary bladder appears normal. Stomach/Bowel: The stomach is unremarkable. The small bowel loops have a normal course and caliber. Unremarkable appearance of the colon. Vascular/Lymphatic: Normal appearance of the abdominal aorta. No enlarged lymph nodes within the abdomen or pelvis. Reproductive: Previous hysterectomy. Left ovary cyst noted measuring up to 1.7 cm. Other: There is no ascites or focal fluid collections within the abdomen or pelvis. Musculoskeletal: Scoliosis and degenerative disc disease. No aggressive lytic or sclerotic bone lesions. IMPRESSION: 1. New nodule in the left upper lobe with central cavitation is identified. Differential considerations includes inflammatory and infectious processes as well as metastatic disease or primary bronchogenic neoplasm. A second nodule, within the right middle lobe, has increased in size from previous exam. 2. Persistent left axillary lymph node is identified measuring 9 mm. No additional areas of adenopathy identified within the chest, abdomen or pelvis. Electronically Signed   By: Kerby Moors M.D.   On: 07/09/2017 14:33   Ct Abdomen Pelvis W Contrast  Result Date: 07/09/2017 CLINICAL DATA:  Metastatic breast cancer.  Restaging. EXAM: CT CHEST, ABDOMEN, AND PELVIS WITH CONTRAST TECHNIQUE: Multidetector CT imaging of the chest, abdomen and pelvis was performed following the standard protocol during bolus administration of intravenous contrast. CONTRAST:  162m ISOVUE-300 IOPAMIDOL (ISOVUE-300) INJECTION 61% COMPARISON:  PET-CT 04/03/2017 FINDINGS: CT CHEST FINDINGS Cardiovascular: The heart size is mildly enlarged. There is no pericardial effusion identified. Mediastinum/Nodes: Interval left axillary nodal dissection. There is a 9 mm enhancing left axillary  lymph node identified, image 14 of series 2. Other previously noted lymph nodes have either resolved or have been resected. No enlarged supraclavicular or right axillary lymph nodes. The trachea appears patent and is midline. Normal appearance of the esophagus. No enlarged mediastinal or hilar lymph nodes. Lungs/Pleura: No pleural effusion. New nodule within the left upper lobe measures 1.4 x 1.3 cm and has a central area of cavitation, image 66 of series 4. In the right middle lobe there is a nodule measure 7 mm, image 90 of series 4. Previously 4 mm. Musculoskeletal: Status post bilateral mastectomy and implant breast reconstruction. No aggressive lytic or sclerotic bone lesions. CT ABDOMEN PELVIS FINDINGS Hepatobiliary: Stable small low density structure and left lobe of liver which is too small to characterize measuring 4 mm, image 47 of series 2. Gallbladder normal. No biliary dilatation. Pancreas: Unremarkable. No pancreatic ductal dilatation or surrounding inflammatory changes. Spleen: Normal in size without focal abnormality. Adrenals/Urinary Tract: The adrenal glands are normal. Bilateral nonobstructing renal calculi. No hydronephrosis or kidney mass. Urinary bladder appears normal. Stomach/Bowel: The stomach is unremarkable.  The small bowel loops have a normal course and caliber. Unremarkable appearance of the colon. Vascular/Lymphatic: Normal appearance of the abdominal aorta. No enlarged lymph nodes within the abdomen or pelvis. Reproductive: Previous hysterectomy. Left ovary cyst noted measuring up to 1.7 cm. Other: There is no ascites or focal fluid collections within the abdomen or pelvis. Musculoskeletal: Scoliosis and degenerative disc disease. No aggressive lytic or sclerotic bone lesions. IMPRESSION: 1. New nodule in the left upper lobe with central cavitation is identified. Differential considerations includes inflammatory and infectious processes as well as metastatic disease or primary  bronchogenic neoplasm. A second nodule, within the right middle lobe, has increased in size from previous exam. 2. Persistent left axillary lymph node is identified measuring 9 mm. No additional areas of adenopathy identified within the chest, abdomen or pelvis. Electronically Signed   By: Kerby Moors M.D.   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    PLAN:    1. 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  She does have a new nodule in her left upper lobe that will require monitoring.  Previously, MRI of the brain was negative.  Patient's CA-27.29 is now within normal limits.  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 Patient's laboratory work is adequate to proceed with cycle 4, day 15 of Taxol and Herceptin. Will give treatments on days 1, 8, and 15 with day 22 off. Plan to reimage again at the conclusion of cycle 6 and then consider possible maintenance Herceptin therapy.  Return to clinic in 2 weeks for consideration of cycle 5, day 1.  Patient will require repeat MUGA prior to the initiation of cycle 5.  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 2. Brain metastasis: Patient has completed XRT. Repeat brain imaging 3 months at the conclusion of cycle 6.  3. 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 Continue  follow-up with cardiology as indicated. 4. Rash: Unclear etiology, possibly related to XRT. Isolated to her scalp and improving. Continue topical treatment.  Increase premedication of dexamethasone to 20 mg IV with each treatment.  5.  Cough/congestion: Significantly improved.  Monitor.  Patient expressed understanding and was in agreement with this plan. She also understands that She can call clinic at any time with any questions, concerns, or complaints.    Breast cancer   Staging form: Breast, AJCC 7th Edition     Clinical stage from 02/14/2015: Stage IA (T1c, N0, M0) - Signed by Lloyd Huger, MD on 02/14/2015   Lloyd Huger, MD   07/26/2017 2:37 PM

## 2017-07-24 ENCOUNTER — Inpatient Hospital Stay: Payer: 59

## 2017-07-24 ENCOUNTER — Other Ambulatory Visit: Payer: Self-pay

## 2017-07-24 ENCOUNTER — Inpatient Hospital Stay (HOSPITAL_BASED_OUTPATIENT_CLINIC_OR_DEPARTMENT_OTHER): Payer: 59 | Admitting: Oncology

## 2017-07-24 VITALS — BP 96/66 | HR 96 | Temp 96.8°F | Resp 18 | Wt 201.7 lb

## 2017-07-24 DIAGNOSIS — R05 Cough: Secondary | ICD-10-CM | POA: Diagnosis not present

## 2017-07-24 DIAGNOSIS — E039 Hypothyroidism, unspecified: Secondary | ICD-10-CM | POA: Diagnosis not present

## 2017-07-24 DIAGNOSIS — F329 Major depressive disorder, single episode, unspecified: Secondary | ICD-10-CM | POA: Diagnosis not present

## 2017-07-24 DIAGNOSIS — R21 Rash and other nonspecific skin eruption: Secondary | ICD-10-CM | POA: Diagnosis not present

## 2017-07-24 DIAGNOSIS — Z923 Personal history of irradiation: Secondary | ICD-10-CM

## 2017-07-24 DIAGNOSIS — C778 Secondary and unspecified malignant neoplasm of lymph nodes of multiple regions: Secondary | ICD-10-CM

## 2017-07-24 DIAGNOSIS — Z170 Estrogen receptor positive status [ER+]: Secondary | ICD-10-CM | POA: Diagnosis not present

## 2017-07-24 DIAGNOSIS — C7989 Secondary malignant neoplasm of other specified sites: Secondary | ICD-10-CM | POA: Diagnosis not present

## 2017-07-24 DIAGNOSIS — C7931 Secondary malignant neoplasm of brain: Secondary | ICD-10-CM

## 2017-07-24 DIAGNOSIS — N83202 Unspecified ovarian cyst, left side: Secondary | ICD-10-CM | POA: Diagnosis not present

## 2017-07-24 DIAGNOSIS — N200 Calculus of kidney: Secondary | ICD-10-CM | POA: Diagnosis not present

## 2017-07-24 DIAGNOSIS — C50411 Malignant neoplasm of upper-outer quadrant of right female breast: Secondary | ICD-10-CM | POA: Diagnosis not present

## 2017-07-24 DIAGNOSIS — Z8669 Personal history of other diseases of the nervous system and sense organs: Secondary | ICD-10-CM

## 2017-07-24 DIAGNOSIS — J45909 Unspecified asthma, uncomplicated: Secondary | ICD-10-CM

## 2017-07-24 DIAGNOSIS — Z800 Family history of malignant neoplasm of digestive organs: Secondary | ICD-10-CM

## 2017-07-24 DIAGNOSIS — Z801 Family history of malignant neoplasm of trachea, bronchus and lung: Secondary | ICD-10-CM

## 2017-07-24 DIAGNOSIS — Z9013 Acquired absence of bilateral breasts and nipples: Secondary | ICD-10-CM

## 2017-07-24 LAB — CANCER ANTIGEN 27.29: CA 27.29: 22.7 U/mL (ref 0.0–38.6)

## 2017-07-24 MED ORDER — SODIUM CHLORIDE 0.9 % IV SOLN
80.0000 mg/m2 | Freq: Once | INTRAVENOUS | Status: AC
  Administered 2017-07-24: 168 mg via INTRAVENOUS
  Filled 2017-07-24: qty 28

## 2017-07-24 MED ORDER — DIPHENHYDRAMINE HCL 50 MG/ML IJ SOLN
25.0000 mg | Freq: Once | INTRAMUSCULAR | Status: AC
  Administered 2017-07-24: 25 mg via INTRAVENOUS
  Filled 2017-07-24: qty 1

## 2017-07-24 MED ORDER — SODIUM CHLORIDE 0.9% FLUSH
10.0000 mL | INTRAVENOUS | Status: DC | PRN
  Administered 2017-07-24: 10 mL
  Filled 2017-07-24: qty 10

## 2017-07-24 MED ORDER — SODIUM CHLORIDE 0.9 % IV SOLN
Freq: Once | INTRAVENOUS | Status: AC
  Administered 2017-07-24: 11:00:00 via INTRAVENOUS
  Filled 2017-07-24: qty 1000

## 2017-07-24 MED ORDER — TRASTUZUMAB CHEMO 150 MG IV SOLR
2.0000 mg/kg | Freq: Once | INTRAVENOUS | Status: AC
  Administered 2017-07-24: 189 mg via INTRAVENOUS
  Filled 2017-07-24: qty 9

## 2017-07-24 MED ORDER — SODIUM CHLORIDE 0.9 % IV SOLN
20.0000 mg | Freq: Once | INTRAVENOUS | Status: AC
  Administered 2017-07-24: 20 mg via INTRAVENOUS
  Filled 2017-07-24: qty 2

## 2017-07-24 MED ORDER — FAMOTIDINE IN NACL 20-0.9 MG/50ML-% IV SOLN
20.0000 mg | Freq: Once | INTRAVENOUS | Status: AC
  Administered 2017-07-24: 20 mg via INTRAVENOUS
  Filled 2017-07-24: qty 50

## 2017-07-24 MED ORDER — ACETAMINOPHEN 325 MG PO TABS
650.0000 mg | ORAL_TABLET | Freq: Once | ORAL | Status: AC
  Administered 2017-07-24: 650 mg via ORAL
  Filled 2017-07-24: qty 2

## 2017-07-24 MED ORDER — HEPARIN SOD (PORK) LOCK FLUSH 100 UNIT/ML IV SOLN
500.0000 [IU] | Freq: Once | INTRAVENOUS | Status: AC | PRN
  Administered 2017-07-24: 500 [IU]
  Filled 2017-07-24: qty 5

## 2017-07-24 NOTE — Progress Notes (Signed)
Patient denies any concerns today.  

## 2017-07-30 ENCOUNTER — Ambulatory Visit
Admission: RE | Admit: 2017-07-30 | Discharge: 2017-07-30 | Disposition: A | Discharge status: Home / Self Care | Payer: 59 | Source: Ambulatory Visit | Attending: Oncology | Admitting: Oncology

## 2017-07-30 DIAGNOSIS — C50411 Malignant neoplasm of upper-outer quadrant of right female breast: Secondary | ICD-10-CM | POA: Insufficient documentation

## 2017-07-30 DIAGNOSIS — C50911 Malignant neoplasm of unspecified site of right female breast: Secondary | ICD-10-CM | POA: Diagnosis not present

## 2017-07-30 MED ORDER — TECHNETIUM TC 99M-LABELED RED BLOOD CELLS IV KIT
22.8600 | PACK | Freq: Once | INTRAVENOUS | Status: AC | PRN
  Administered 2017-07-30: 22.86 via INTRAVENOUS

## 2017-08-01 ENCOUNTER — Encounter: Payer: Self-pay | Admitting: Oncology

## 2017-08-05 DIAGNOSIS — C50411 Malignant neoplasm of upper-outer quadrant of right female breast: Secondary | ICD-10-CM | POA: Diagnosis not present

## 2017-08-06 NOTE — Progress Notes (Signed)
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   INTERVAL HISTORY: Patient returns to clinic today for further evaluation and consideration of cycle 5, day 1 of Taxol and Herceptin. She continues to have scalp erythema isolated to her scalp for approximately 24-48 hours after her treatment which then resolves with topical cream. Her rash was improved with increasing her dexamethasone premedication.  She is otherwise tolerating her treatments well without significant side effects. She has no further shortness of breath.  She has no neurologic complaints. She denies any recent fevers or illnesses. She has a good appetite and has maintained her weight.  She has no chest pain.  She has no nausea, vomiting, constipation, or diarrhea.  She has no urinary complaints. Patient offers no further specific complaints today.  REVIEW OF SYSTEMS:   Review of Systems  Constitutional: Negative.  Negative for fever, malaise/fatigue and weight loss.  HENT: Negative.  Negative for congestion.   Respiratory: Negative.  Negative for cough and shortness of breath.   Cardiovascular: Negative.  Negative for chest pain and leg swelling.  Gastrointestinal: Negative.  Negative for abdominal pain.  Genitourinary: Negative.   Musculoskeletal: Negative.   Skin: Positive for rash. Negative for itching.  Neurological: Negative.  Negative for sensory change and weakness.  Psychiatric/Behavioral: The patient is nervous/anxious.    As per HPI. Otherwise, a complete review of systems is  negative.   PAST MEDICAL HISTORY:  Hypothyroidism, migraines, depression, asthma.  PAST SURGICAL HISTORY: Bilateral mastectomy with reconstruction, partial hysterectomy.  FAMILY HISTORY: Lung cancer, melanoma, stomach cancer.  Also diabetes, CAD, hypertension     ADVANCED DIRECTIVES:    HEALTH MAINTENANCE: Social History   Tobacco Use  . Smoking status: Never Smoker  . Smokeless tobacco: Never Used  Substance Use Topics  . Alcohol use: No  . Drug use: No     Colonoscopy:  PAP:  Bone density:  Lipid panel:  Allergies  Allergen Reactions  . Codeine Other (See Comments)    constipation  . Sulfa Antibiotics Other (See Comments)    Allergy as a child    Current Outpatient Medications  Medication Sig Dispense Refill  . alendronate (FOSAMAX) 70 MG tablet Take 70 mg by mouth once a week. Take with a full glass of water on an empty stomach.     . ALPRAZolam (XANAX) 0.25 MG tablet Take 1 tablet by mouth 3 (three) times daily as needed for anxiety.     Marland Kitchen amoxicillin-clavulanate (AUGMENTIN) 875-125 MG tablet Take 1 tablet by mouth 2 (two) times daily. 14 tablet 0  . azelastine (ASTELIN) 0.1 % nasal spray Place 1 spray into both nostrils daily as needed for rhinitis. Use in each nostril as directed    . calcium-vitamin D (OSCAL WITH D) 250-125 MG-UNIT tablet Take 1 tablet by mouth daily.    . Cholecalciferol (D3 HIGH POTENCY) 2000 units CAPS Take 1 capsule by mouth daily.    . cyclobenzaprine (FLEXERIL) 10 MG tablet Take 10 mg by mouth 3 (three) times daily as needed.     . DULoxetine (CYMBALTA) 30 MG capsule Take 90 mg  by mouth every morning.     . fluticasone (FLOVENT HFA) 110 MCG/ACT inhaler Inhale 1 puff into the lungs 2 (two) times daily.    Marland Kitchen levothyroxine (SYNTHROID, LEVOTHROID) 137 MCG tablet Take 137 mcg by mouth daily before breakfast.     . lidocaine-prilocaine (EMLA) cream Apply to affected area once 30 g 3  . metoprolol succinate (TOPROL-XL) 25 MG 24 hr tablet Take  75 mg by mouth at bedtime.     . Multiple Vitamin (MULTIVITAMIN) tablet Take 1 tablet by mouth daily.    . ondansetron (ZOFRAN) 8 MG tablet Take 1 tablet (8 mg total) by mouth 2 (two) times daily as needed (Nausea or vomiting). 60 tablet 3  . prochlorperazine (COMPAZINE) 10 MG tablet Take 1 tablet (10 mg total) by mouth every 6 (six) hours as needed (Nausea or vomiting). 60 tablet 3  . topiramate (TOPAMAX) 50 MG tablet Take 50 mg by mouth at bedtime.     . traMADol (ULTRAM) 50 MG tablet Take 25-50 mg by mouth every 6 (six) hours as needed.     . Triamcinolone Acetonide (TRIAMCINOLONE 0.1 % CREAM : EUCERIN) CREA Apply 1 application topically 2 (two) times daily as needed. 1 each 0   No current facility-administered medications for this visit.    Facility-Administered Medications Ordered in Other Visits  Medication Dose Route Frequency Provider Last Rate Last Dose  . diphenhydrAMINE (BENADRYL) capsule 25 mg  25 mg Oral Once Lloyd Huger, MD      . famotidine (PEPCID) IVPB 20 mg premix  20 mg Intravenous Once Lloyd Huger, MD   20 mg at 08/07/17 1145  . heparin lock flush 100 unit/mL  500 Units Intracatheter Once PRN Lloyd Huger, MD      . PACLitaxel (TAXOL) 168 mg in sodium chloride 0.9 % 250 mL chemo infusion (</= 60m/m2)  80 mg/m2 (Treatment Plan Recorded) Intravenous Once FLloyd Huger MD      . sodium chloride flush (NS) 0.9 % injection 10 mL  10 mL Intracatheter PRN FLloyd Huger MD   10 mL at 08/07/17 1055  . trastuzumab (HERCEPTIN) 189 mg in sodium chloride 0.9 % 250 mL chemo infusion  2 mg/kg (Treatment Plan Recorded) Intravenous Once FLloyd Huger MD        OBJECTIVE: Vitals:   08/07/17 0926  BP: 100/66  Pulse: 73  Resp: 18  Temp: 98.9 F (37.2 C)     Body mass index is 35.16 kg/m.    ECOG FS:0 - Asymptomatic  General: Well-developed, well-nourished, no acute distress. Eyes: anicteric sclera. Breasts: Patient has bilateral  mastectomies with reconstruction, no obvious evidence of local recurrence. Lungs: Clear to auscultation bilaterally. Port in place in the right chest wall. Heart: Regular rate and rhythm. No rubs, murmurs, or gallops. Abdomen: Soft, nontender, nondistended. No organomegaly noted, normoactive bowel sounds. Musculoskeletal: No edema, cyanosis, or clubbing. Neuro: Alert, answering all questions appropriately. Cranial nerves grossly intact. Skin: Mild erythema noted on scalp and torso. Psych: Normal affect.   LAB RESULTS:  Lab Results  Component Value Date   NA 136 07/23/2017   K 3.6 07/23/2017   CL 107 07/23/2017   CO2 19 (L) 07/23/2017   GLUCOSE 117 (H) 07/23/2017   BUN 13 07/23/2017   CREATININE 0.90 07/23/2017   CALCIUM 9.6 07/23/2017   PROT 7.4 07/23/2017   ALBUMIN 4.0 07/23/2017   AST 26 07/23/2017   ALT 18 07/23/2017   ALKPHOS 68 07/23/2017   BILITOT  0.6 07/23/2017   GFRNONAA >60 07/23/2017   GFRAA >60 07/23/2017    Lab Results  Component Value Date   WBC 3.8 07/23/2017   NEUTROABS 2.8 07/23/2017   HGB 12.4 07/23/2017   HCT 36.3 07/23/2017   MCV 96.7 07/23/2017   PLT 331 07/23/2017     STUDIES: Ct Chest W Contrast  Result Date: 07/09/2017 CLINICAL DATA:  Metastatic breast cancer.  Restaging. EXAM: CT CHEST, ABDOMEN, AND PELVIS WITH CONTRAST TECHNIQUE: Multidetector CT imaging of the chest, abdomen and pelvis was performed following the standard protocol during bolus administration of intravenous contrast. CONTRAST:  14m ISOVUE-300 IOPAMIDOL (ISOVUE-300) INJECTION 61% COMPARISON:  PET-CT 04/03/2017 FINDINGS: CT CHEST FINDINGS Cardiovascular: The heart size is mildly enlarged. There is no pericardial effusion identified. Mediastinum/Nodes: Interval left axillary nodal dissection. There is a 9 mm enhancing left axillary lymph node identified, image 14 of series 2. Other previously noted lymph nodes have either resolved or have been resected. No enlarged  supraclavicular or right axillary lymph nodes. The trachea appears patent and is midline. Normal appearance of the esophagus. No enlarged mediastinal or hilar lymph nodes. Lungs/Pleura: No pleural effusion. New nodule within the left upper lobe measures 1.4 x 1.3 cm and has a central area of cavitation, image 66 of series 4. In the right middle lobe there is a nodule measure 7 mm, image 90 of series 4. Previously 4 mm. Musculoskeletal: Status post bilateral mastectomy and implant breast reconstruction. No aggressive lytic or sclerotic bone lesions. CT ABDOMEN PELVIS FINDINGS Hepatobiliary: Stable small low density structure and left lobe of liver which is too small to characterize measuring 4 mm, image 47 of series 2. Gallbladder normal. No biliary dilatation. Pancreas: Unremarkable. No pancreatic ductal dilatation or surrounding inflammatory changes. Spleen: Normal in size without focal abnormality. Adrenals/Urinary Tract: The adrenal glands are normal. Bilateral nonobstructing renal calculi. No hydronephrosis or kidney mass. Urinary bladder appears normal. Stomach/Bowel: The stomach is unremarkable. The small bowel loops have a normal course and caliber. Unremarkable appearance of the colon. Vascular/Lymphatic: Normal appearance of the abdominal aorta. No enlarged lymph nodes within the abdomen or pelvis. Reproductive: Previous hysterectomy. Left ovary cyst noted measuring up to 1.7 cm. Other: There is no ascites or focal fluid collections within the abdomen or pelvis. Musculoskeletal: Scoliosis and degenerative disc disease. No aggressive lytic or sclerotic bone lesions. IMPRESSION: 1. New nodule in the left upper lobe with central cavitation is identified. Differential considerations includes inflammatory and infectious processes as well as metastatic disease or primary bronchogenic neoplasm. A second nodule, within the right middle lobe, has increased in size from previous exam. 2. Persistent left axillary  lymph node is identified measuring 9 mm. No additional areas of adenopathy identified within the chest, abdomen or pelvis. Electronically Signed   By: TKerby MoorsM.D.   On: 07/09/2017 14:33   Nm Cardiac Muga Rest  Result Date: 07/31/2017 CLINICAL DATA:  RIGHT breast cancer, high risk chemotherapy EXAM: NUCLEAR MEDICINE CARDIAC BLOOD POOL IMAGING (MUGA) TECHNIQUE: Cardiac multi-gated acquisition was performed at rest following intravenous injection of Tc-942mabeled red blood cells. RADIOPHARMACEUTICALS:  22.86 mCi [id number] in-vitro labeled autologous red blood cells IV COMPARISON:  04/30/2017 FINDINGS: Calculated LEFT ventricular ejection fraction is 54%, decreased from the 64% on the previous exam. Study was obtained at a cardiac rate of 74 beats per minute. Patient was rhythmic during image acquisition. No focal wall motion abnormalities. IMPRESSION: LEFT ventricular ejection fraction is calculated at 54% decreased from the 64% on the  previous exam. No focal wall motion abnormalities. Electronically Signed   By: Lavonia Dana M.D.   On: 07/31/2017 09:55   Ct Abdomen Pelvis W Contrast  Result Date: 07/09/2017 CLINICAL DATA:  Metastatic breast cancer.  Restaging. EXAM: CT CHEST, ABDOMEN, AND PELVIS WITH CONTRAST TECHNIQUE: Multidetector CT imaging of the chest, abdomen and pelvis was performed following the standard protocol during bolus administration of intravenous contrast. CONTRAST:  165m ISOVUE-300 IOPAMIDOL (ISOVUE-300) INJECTION 61% COMPARISON:  PET-CT 04/03/2017 FINDINGS: CT CHEST FINDINGS Cardiovascular: The heart size is mildly enlarged. There is no pericardial effusion identified. Mediastinum/Nodes: Interval left axillary nodal dissection. There is a 9 mm enhancing left axillary lymph node identified, image 14 of series 2. Other previously noted lymph nodes have either resolved or have been resected. No enlarged supraclavicular or right axillary lymph nodes. The trachea appears  patent and is midline. Normal appearance of the esophagus. No enlarged mediastinal or hilar lymph nodes. Lungs/Pleura: No pleural effusion. New nodule within the left upper lobe measures 1.4 x 1.3 cm and has a central area of cavitation, image 66 of series 4. In the right middle lobe there is a nodule measure 7 mm, image 90 of series 4. Previously 4 mm. Musculoskeletal: Status post bilateral mastectomy and implant breast reconstruction. No aggressive lytic or sclerotic bone lesions. CT ABDOMEN PELVIS FINDINGS Hepatobiliary: Stable small low density structure and left lobe of liver which is too small to characterize measuring 4 mm, image 47 of series 2. Gallbladder normal. No biliary dilatation. Pancreas: Unremarkable. No pancreatic ductal dilatation or surrounding inflammatory changes. Spleen: Normal in size without focal abnormality. Adrenals/Urinary Tract: The adrenal glands are normal. Bilateral nonobstructing renal calculi. No hydronephrosis or kidney mass. Urinary bladder appears normal. Stomach/Bowel: The stomach is unremarkable. The small bowel loops have a normal course and caliber. Unremarkable appearance of the colon. Vascular/Lymphatic: Normal appearance of the abdominal aorta. No enlarged lymph nodes within the abdomen or pelvis. Reproductive: Previous hysterectomy. Left ovary cyst noted measuring up to 1.7 cm. Other: There is no ascites or focal fluid collections within the abdomen or pelvis. Musculoskeletal: Scoliosis and degenerative disc disease. No aggressive lytic or sclerotic bone lesions. IMPRESSION: 1. New nodule in the left upper lobe with central cavitation is identified. Differential considerations includes inflammatory and infectious processes as well as metastatic disease or primary bronchogenic neoplasm. A second nodule, within the right middle lobe, has increased in size from previous exam. 2. Persistent left axillary lymph node is identified measuring 9 mm. No additional areas of  adenopathy identified within the chest, abdomen or pelvis. Electronically Signed   By: TKerby MoorsM.D.   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    PLAN:    1. 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  She does have a new nodule in her left upper lobe that will require monitoring.  Previously, MRI of the brain was negative.  Patient's CA-27.29 is now within normal limits.  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  MUGA scan on July 30, 2017 reported EF of 54%.  Patient's laboratory work is adequate to proceed with cycle 5, day 1 of Taxol and Herceptin. Will give treatments on days 1, 8, and 15 with day 22 off. Plan to reimage again at the conclusion of cycle 6 and then consider possible maintenance Herceptin therapy.  Return to clinic in 1 week for consideration of cycle 5, day 8.  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 2. Brain metastasis: Patient has completed XRT. Repeat brain imaging at the conclusion of cycle 6.  3. 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 Continue follow-up with cardiology as indicated. 4. Rash: Unclear etiology, possibly related to XRT. Isolated to her scalp and improving. Continue topical treatment.  Increase premedication of dexamethasone to 20 mg IV with each treatment.  5.   Cough/congestion: Resolved.  Patient expressed understanding and was in agreement with this plan. She also understands that She can call clinic at any time with any questions, concerns, or complaints.    Breast cancer   Staging form: Breast, AJCC 7th Edition     Clinical stage from 02/14/2015: Stage IA (T1c, N0, M0) - Signed by Lloyd Huger, MD on 02/14/2015   Lloyd Huger, MD   08/07/2017 11:48 AM

## 2017-08-07 ENCOUNTER — Inpatient Hospital Stay: Payer: 59

## 2017-08-07 ENCOUNTER — Inpatient Hospital Stay (HOSPITAL_BASED_OUTPATIENT_CLINIC_OR_DEPARTMENT_OTHER): Payer: 59 | Admitting: Oncology

## 2017-08-07 VITALS — BP 100/66 | HR 73 | Temp 98.9°F | Resp 18 | Wt 198.5 lb

## 2017-08-07 DIAGNOSIS — C7931 Secondary malignant neoplasm of brain: Secondary | ICD-10-CM

## 2017-08-07 DIAGNOSIS — Z801 Family history of malignant neoplasm of trachea, bronchus and lung: Secondary | ICD-10-CM

## 2017-08-07 DIAGNOSIS — C50411 Malignant neoplasm of upper-outer quadrant of right female breast: Secondary | ICD-10-CM | POA: Diagnosis not present

## 2017-08-07 DIAGNOSIS — C7989 Secondary malignant neoplasm of other specified sites: Secondary | ICD-10-CM | POA: Diagnosis not present

## 2017-08-07 DIAGNOSIS — R05 Cough: Secondary | ICD-10-CM

## 2017-08-07 DIAGNOSIS — E039 Hypothyroidism, unspecified: Secondary | ICD-10-CM | POA: Diagnosis not present

## 2017-08-07 DIAGNOSIS — F329 Major depressive disorder, single episode, unspecified: Secondary | ICD-10-CM | POA: Diagnosis not present

## 2017-08-07 DIAGNOSIS — N83202 Unspecified ovarian cyst, left side: Secondary | ICD-10-CM

## 2017-08-07 DIAGNOSIS — J45909 Unspecified asthma, uncomplicated: Secondary | ICD-10-CM | POA: Diagnosis not present

## 2017-08-07 DIAGNOSIS — Z170 Estrogen receptor positive status [ER+]: Secondary | ICD-10-CM | POA: Diagnosis not present

## 2017-08-07 DIAGNOSIS — Z8669 Personal history of other diseases of the nervous system and sense organs: Secondary | ICD-10-CM

## 2017-08-07 DIAGNOSIS — Z800 Family history of malignant neoplasm of digestive organs: Secondary | ICD-10-CM

## 2017-08-07 DIAGNOSIS — Z9013 Acquired absence of bilateral breasts and nipples: Secondary | ICD-10-CM

## 2017-08-07 DIAGNOSIS — N200 Calculus of kidney: Secondary | ICD-10-CM | POA: Diagnosis not present

## 2017-08-07 DIAGNOSIS — R21 Rash and other nonspecific skin eruption: Secondary | ICD-10-CM | POA: Diagnosis not present

## 2017-08-07 DIAGNOSIS — C778 Secondary and unspecified malignant neoplasm of lymph nodes of multiple regions: Secondary | ICD-10-CM | POA: Diagnosis not present

## 2017-08-07 DIAGNOSIS — Z923 Personal history of irradiation: Secondary | ICD-10-CM

## 2017-08-07 MED ORDER — DEXAMETHASONE SODIUM PHOSPHATE 100 MG/10ML IJ SOLN
20.0000 mg | Freq: Once | INTRAMUSCULAR | Status: AC
  Administered 2017-08-07: 20 mg via INTRAVENOUS
  Filled 2017-08-07: qty 2

## 2017-08-07 MED ORDER — DIPHENHYDRAMINE HCL 50 MG/ML IJ SOLN
25.0000 mg | Freq: Once | INTRAMUSCULAR | Status: AC
  Administered 2017-08-07: 25 mg via INTRAVENOUS
  Filled 2017-08-07: qty 1

## 2017-08-07 MED ORDER — DIPHENHYDRAMINE HCL 25 MG PO CAPS: 25.0000 mg | ORAL_CAPSULE | Freq: Once | ORAL | Status: DC

## 2017-08-07 MED ORDER — TRASTUZUMAB CHEMO 150 MG IV SOLR
2.0000 mg/kg | Freq: Once | INTRAVENOUS | Status: AC
  Administered 2017-08-07: 189 mg via INTRAVENOUS
  Filled 2017-08-07: qty 9

## 2017-08-07 MED ORDER — ACETAMINOPHEN 325 MG PO TABS
650.0000 mg | ORAL_TABLET | Freq: Once | ORAL | Status: AC
  Administered 2017-08-07: 650 mg via ORAL
  Filled 2017-08-07: qty 2

## 2017-08-07 MED ORDER — SODIUM CHLORIDE 0.9 % IV SOLN
Freq: Once | INTRAVENOUS | Status: AC
  Administered 2017-08-07: 11:00:00 via INTRAVENOUS
  Filled 2017-08-07: qty 1000

## 2017-08-07 MED ORDER — SODIUM CHLORIDE 0.9% FLUSH
10.0000 mL | INTRAVENOUS | Status: DC | PRN
  Administered 2017-08-07: 10 mL
  Filled 2017-08-07: qty 10

## 2017-08-07 MED ORDER — DEXAMETHASONE SODIUM PHOSPHATE 10 MG/ML IJ SOLN: 20.0000 mg | Freq: Once | INTRAMUSCULAR | Status: DC

## 2017-08-07 MED ORDER — SODIUM CHLORIDE 0.9 % IV SOLN
80.0000 mg/m2 | Freq: Once | INTRAVENOUS | Status: AC
  Administered 2017-08-07: 168 mg via INTRAVENOUS
  Filled 2017-08-07: qty 28

## 2017-08-07 MED ORDER — HEPARIN SOD (PORK) LOCK FLUSH 100 UNIT/ML IV SOLN
500.0000 [IU] | Freq: Once | INTRAVENOUS | Status: AC | PRN
  Administered 2017-08-07: 500 [IU]
  Filled 2017-08-07: qty 5

## 2017-08-07 MED ORDER — FAMOTIDINE IN NACL 20-0.9 MG/50ML-% IV SOLN
20.0000 mg | Freq: Once | INTRAVENOUS | Status: AC
  Administered 2017-08-07: 20 mg via INTRAVENOUS
  Filled 2017-08-07: qty 50

## 2017-08-08 ENCOUNTER — Encounter: Payer: Self-pay | Admitting: Oncology

## 2017-08-12 DIAGNOSIS — C50411 Malignant neoplasm of upper-outer quadrant of right female breast: Secondary | ICD-10-CM | POA: Diagnosis not present

## 2017-08-14 NOTE — Progress Notes (Signed)
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   INTERVAL HISTORY: Patient returns to clinic today for further evaluation and consideration of cycle 5, day 8 of Taxol and Herceptin. She continues to have erythema isolated to her scalp for approximately 24-48 hours after her treatment which then resolves with topical cream. Her rash was improved with increasing her dexamethasone premedication.  She is otherwise tolerating her treatments well without significant side effects. She has no further shortness of breath.  She has no neurologic complaints. She denies any recent fevers or illnesses. She has a good appetite and has maintained her weight.  She has no chest pain.  She has no nausea, vomiting, constipation, or diarrhea.  She has no urinary complaints. Patient offers no further specific complaints today.  REVIEW OF SYSTEMS:   Review of Systems  Constitutional: Negative.  Negative for fever, malaise/fatigue and weight loss.  HENT: Negative.  Negative for congestion.   Respiratory: Negative.  Negative for cough and shortness of breath.   Cardiovascular: Negative.  Negative for chest pain and leg swelling.  Gastrointestinal: Negative.  Negative for abdominal pain.  Genitourinary: Negative.   Musculoskeletal: Negative.   Skin: Negative.  Negative for itching and rash.  Neurological: Negative.  Negative for sensory change and weakness.  Psychiatric/Behavioral: The patient is nervous/anxious.    As per HPI. Otherwise, a complete review of systems is  negative.   PAST MEDICAL HISTORY:  Hypothyroidism, migraines, depression, asthma.  PAST SURGICAL HISTORY: Bilateral mastectomy with reconstruction, partial hysterectomy.  FAMILY HISTORY: Lung cancer, melanoma, stomach cancer.  Also diabetes, CAD, hypertension     ADVANCED DIRECTIVES:    HEALTH MAINTENANCE: Social History   Tobacco Use  . Smoking status: Never Smoker  . Smokeless tobacco: Never Used  Substance Use Topics  . Alcohol use: No  . Drug use: No     Colonoscopy:  PAP:  Bone density:  Lipid panel:  Allergies  Allergen Reactions  . Codeine Other (See Comments)    constipation  . Sulfa Antibiotics Other (See Comments)    Allergy as a child    Current Outpatient Medications  Medication Sig Dispense Refill  . alendronate (FOSAMAX) 70 MG tablet Take 70 mg by mouth once a week. Take with a full glass of water on an empty stomach.     . ALPRAZolam (XANAX) 0.25 MG tablet Take 1 tablet by mouth 3 (three) times daily as needed for anxiety.     . azelastine (ASTELIN) 0.1 % nasal spray Place 1 spray into both nostrils daily as needed for rhinitis. Use in each nostril as directed    . calcium-vitamin D (OSCAL WITH D) 250-125 MG-UNIT tablet Take 1 tablet by mouth daily.    . Cholecalciferol (D3 HIGH POTENCY) 2000 units CAPS Take 1 capsule by mouth daily.    . cyclobenzaprine (FLEXERIL) 10 MG tablet Take 10 mg by mouth 3 (three) times daily as needed.     . DULoxetine (CYMBALTA) 30 MG capsule Take 90 mg by mouth every morning.     . fluticasone (FLOVENT HFA) 110 MCG/ACT inhaler Inhale 1 puff into   the lungs 2 (two) times daily.    . levothyroxine (SYNTHROID, LEVOTHROID) 137 MCG tablet Take 137 mcg by mouth daily before breakfast.     . lidocaine-prilocaine (EMLA) cream Apply to affected area once 30 g 3  . metoprolol succinate (TOPROL-XL) 25 MG 24 hr tablet Take 75 mg by mouth at bedtime.     . Multiple Vitamin (MULTIVITAMIN) tablet Take 1 tablet by mouth daily.    .  ondansetron (ZOFRAN) 8 MG tablet Take 1 tablet (8 mg total) by mouth 2 (two) times daily as needed (Nausea or vomiting). 60 tablet 3  . prochlorperazine (COMPAZINE) 10 MG tablet Take 1 tablet (10 mg total) by mouth every 6 (six) hours as needed (Nausea or vomiting). 60 tablet 3  . topiramate (TOPAMAX) 50 MG tablet Take 50 mg by mouth at bedtime.     . traMADol (ULTRAM) 50 MG tablet Take 25-50 mg by mouth every 6 (six) hours as needed.     . Triamcinolone Acetonide (TRIAMCINOLONE 0.1 % CREAM : EUCERIN) CREA Apply 1 application topically 2 (two) times daily as needed. 1 each 0   No current facility-administered medications for this visit.    Facility-Administered Medications Ordered in Other Visits  Medication Dose Route Frequency Provider Last Rate Last Dose  . dexamethasone (DECADRON) 20 mg in sodium chloride 0.9 % 50 mL IVPB  20 mg Intravenous Once Finnegan, Timothy J, MD   20 mg at 08/15/17 1055  . heparin lock flush 100 unit/mL  500 Units Intracatheter Once PRN Finnegan, Timothy J, MD      . PACLitaxel (TAXOL) 168 mg in sodium chloride 0.9 % 250 mL chemo infusion (</= 80mg/m2)  80 mg/m2 (Treatment Plan Recorded) Intravenous Once Finnegan, Timothy J, MD      . sodium chloride flush (NS) 0.9 % injection 10 mL  10 mL Intracatheter PRN Finnegan, Timothy J, MD   10 mL at 08/15/17 1020  . trastuzumab (HERCEPTIN) 189 mg in sodium chloride 0.9 % 250 mL chemo infusion  2 mg/kg (Treatment Plan Recorded) Intravenous Once Finnegan, Timothy J, MD        OBJECTIVE: There were no vitals filed for this visit.   There is no height or weight on file to calculate BMI.    ECOG FS:0 - Asymptomatic  General: Well-developed, well-nourished, no acute distress. Eyes: anicteric sclera. Breasts: Patient has bilateral mastectomies with reconstruction, no obvious evidence of local recurrence. Lungs: Clear to auscultation bilaterally. Port in place in the right chest wall. Heart: Regular rate and rhythm. No rubs,  murmurs, or gallops. Abdomen: Soft, nontender, nondistended. No organomegaly noted, normoactive bowel sounds. Musculoskeletal: No edema, cyanosis, or clubbing. Neuro: Alert, answering all questions appropriately. Cranial nerves grossly intact. Skin: Mild erythema noted on scalp and torso. Psych: Normal affect.   LAB RESULTS:  Lab Results  Component Value Date   NA 136 07/23/2017   K 3.6 07/23/2017   CL 107 07/23/2017   CO2 19 (L) 07/23/2017   GLUCOSE 117 (H) 07/23/2017   BUN 13 07/23/2017   CREATININE 0.90 07/23/2017   CALCIUM 9.6 07/23/2017   PROT 7.4 07/23/2017   ALBUMIN 4.0 07/23/2017   AST 26 07/23/2017   ALT 18 07/23/2017   ALKPHOS 68 07/23/2017   BILITOT 0.6 07/23/2017   GFRNONAA >60 07/23/2017   GFRAA >60 07/23/2017    Lab Results  Component Value Date   WBC 3.8 07/23/2017   NEUTROABS 2.8 07/23/2017   HGB 12.4 07/23/2017   HCT 36.3 07/23/2017     MCV 96.7 07/23/2017   PLT 331 07/23/2017     STUDIES: Nm Cardiac Muga Rest  Result Date: 07/31/2017 CLINICAL DATA:  RIGHT breast cancer, high risk chemotherapy EXAM: NUCLEAR MEDICINE CARDIAC BLOOD POOL IMAGING (MUGA) TECHNIQUE: Cardiac multi-gated acquisition was performed at rest following intravenous injection of Tc-99m labeled red blood cells. RADIOPHARMACEUTICALS:  22.86 mCi Tc-99m pertechnetate in-vitro labeled autologous red blood cells IV COMPARISON:  04/30/2017 FINDINGS: Calculated LEFT ventricular ejection fraction is 54%, decreased from the 64% on the previous exam. Study was obtained at a cardiac rate of 74 beats per minute. Patient was rhythmic during image acquisition. No focal wall motion abnormalities. IMPRESSION: LEFT ventricular ejection fraction is calculated at 54% decreased from the 64% on the previous exam. No focal wall motion abnormalities. Electronically Signed   By: Mark  Boles M.D.   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    PLAN:    1. 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  She does have a new nodule in her left upper lobe that will require monitoring.  Previously, MRI of the brain was negative.  Patient's CA-27.29 is now within normal limits.  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  MUGA scan on July 30, 2017 reported EF of 54%.  Patient's laboratory work is adequate to proceed with cycle 5, day 8 of Taxol and Herceptin. Will give treatments on days 1, 8, and 15 with day 22 off. Plan to reimage again at the conclusion of cycle 6 and then consider possible maintenance Herceptin therapy.  Return to clinic in 1 week for consideration of cycle 5, day 15.  Of note, patient gets all of her laboratory work at [facility]. 2. Brain metastasis: Patient has completed XRT. Repeat brain imaging at the conclusion of cycle 6.  3. 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 Continue follow-up with cardiology as indicated. 4.  Erythema/rash: Unclear etiology, possibly related to XRT. Isolated to her scalp and improving. Continue topical treatment.  Increase premedication of dexamethasone to 20 mg IV with each treatment.  5.  Cough/congestion: Resolved.  Patient expressed understanding and was in agreement with this plan. She also understands that She can call clinic at any time with any questions, concerns, or complaints.    Breast  cancer   Staging form: Breast, AJCC 7th Edition     Clinical stage from 02/14/2015: Stage IA (T1c, N0, M0) - Signed by Timothy J Finnegan, MD on 02/14/2015   Timothy J Finnegan, MD   08/15/2017 11:02 AM     

## 2017-08-15 ENCOUNTER — Inpatient Hospital Stay: Payer: 59 | Attending: Oncology | Admitting: Oncology

## 2017-08-15 ENCOUNTER — Encounter: Payer: Self-pay | Admitting: Oncology

## 2017-08-15 ENCOUNTER — Inpatient Hospital Stay: Payer: 59

## 2017-08-15 ENCOUNTER — Other Ambulatory Visit: Payer: Self-pay | Admitting: Oncology

## 2017-08-15 ENCOUNTER — Other Ambulatory Visit: Payer: Self-pay

## 2017-08-15 DIAGNOSIS — F329 Major depressive disorder, single episode, unspecified: Secondary | ICD-10-CM | POA: Diagnosis not present

## 2017-08-15 DIAGNOSIS — Z801 Family history of malignant neoplasm of trachea, bronchus and lung: Secondary | ICD-10-CM

## 2017-08-15 DIAGNOSIS — J45909 Unspecified asthma, uncomplicated: Secondary | ICD-10-CM | POA: Diagnosis not present

## 2017-08-15 DIAGNOSIS — Z800 Family history of malignant neoplasm of digestive organs: Secondary | ICD-10-CM

## 2017-08-15 DIAGNOSIS — Z9013 Acquired absence of bilateral breasts and nipples: Secondary | ICD-10-CM | POA: Diagnosis not present

## 2017-08-15 DIAGNOSIS — Z170 Estrogen receptor positive status [ER+]: Secondary | ICD-10-CM | POA: Diagnosis not present

## 2017-08-15 DIAGNOSIS — E039 Hypothyroidism, unspecified: Secondary | ICD-10-CM | POA: Diagnosis not present

## 2017-08-15 DIAGNOSIS — J948 Other specified pleural conditions: Secondary | ICD-10-CM | POA: Diagnosis not present

## 2017-08-15 DIAGNOSIS — C50411 Malignant neoplasm of upper-outer quadrant of right female breast: Secondary | ICD-10-CM

## 2017-08-15 DIAGNOSIS — R911 Solitary pulmonary nodule: Secondary | ICD-10-CM | POA: Diagnosis not present

## 2017-08-15 DIAGNOSIS — C7931 Secondary malignant neoplasm of brain: Secondary | ICD-10-CM

## 2017-08-15 DIAGNOSIS — C779 Secondary and unspecified malignant neoplasm of lymph node, unspecified: Secondary | ICD-10-CM

## 2017-08-15 DIAGNOSIS — Z79899 Other long term (current) drug therapy: Secondary | ICD-10-CM | POA: Diagnosis not present

## 2017-08-15 DIAGNOSIS — L539 Erythematous condition, unspecified: Secondary | ICD-10-CM | POA: Diagnosis not present

## 2017-08-15 DIAGNOSIS — Z923 Personal history of irradiation: Secondary | ICD-10-CM

## 2017-08-15 DIAGNOSIS — Z5111 Encounter for antineoplastic chemotherapy: Secondary | ICD-10-CM | POA: Insufficient documentation

## 2017-08-15 DIAGNOSIS — R21 Rash and other nonspecific skin eruption: Secondary | ICD-10-CM

## 2017-08-15 DIAGNOSIS — Z5112 Encounter for antineoplastic immunotherapy: Secondary | ICD-10-CM | POA: Diagnosis not present

## 2017-08-15 DIAGNOSIS — Z8669 Personal history of other diseases of the nervous system and sense organs: Secondary | ICD-10-CM

## 2017-08-15 MED ORDER — DEXAMETHASONE SODIUM PHOSPHATE 100 MG/10ML IJ SOLN
20.0000 mg | Freq: Once | INTRAMUSCULAR | Status: AC
  Administered 2017-08-15: 20 mg via INTRAVENOUS
  Filled 2017-08-15: qty 2

## 2017-08-15 MED ORDER — SODIUM CHLORIDE 0.9 % IV SOLN
Freq: Once | INTRAVENOUS | Status: AC
  Administered 2017-08-15: 10:00:00 via INTRAVENOUS
  Filled 2017-08-15: qty 1000

## 2017-08-15 MED ORDER — SODIUM CHLORIDE 0.9% FLUSH
10.0000 mL | INTRAVENOUS | Status: DC | PRN
  Administered 2017-08-15: 10 mL
  Filled 2017-08-15: qty 10

## 2017-08-15 MED ORDER — DIPHENHYDRAMINE HCL 50 MG/ML IJ SOLN
25.0000 mg | Freq: Once | INTRAMUSCULAR | Status: AC
  Administered 2017-08-15: 25 mg via INTRAVENOUS
  Filled 2017-08-15: qty 1

## 2017-08-15 MED ORDER — ACETAMINOPHEN 325 MG PO TABS
650.0000 mg | ORAL_TABLET | Freq: Once | ORAL | Status: AC
  Administered 2017-08-15: 650 mg via ORAL
  Filled 2017-08-15: qty 2

## 2017-08-15 MED ORDER — SODIUM CHLORIDE 0.9 % IV SOLN
2.0000 mg/kg | Freq: Once | INTRAVENOUS | Status: AC
  Administered 2017-08-15: 189 mg via INTRAVENOUS
  Filled 2017-08-15: qty 9

## 2017-08-15 MED ORDER — FAMOTIDINE IN NACL 20-0.9 MG/50ML-% IV SOLN
20.0000 mg | Freq: Once | INTRAVENOUS | Status: AC
  Administered 2017-08-15: 20 mg via INTRAVENOUS
  Filled 2017-08-15: qty 50

## 2017-08-15 MED ORDER — SODIUM CHLORIDE 0.9 % IV SOLN
80.0000 mg/m2 | Freq: Once | INTRAVENOUS | Status: AC
  Administered 2017-08-15: 168 mg via INTRAVENOUS
  Filled 2017-08-15: qty 28

## 2017-08-15 MED ORDER — HEPARIN SOD (PORK) LOCK FLUSH 100 UNIT/ML IV SOLN
500.0000 [IU] | Freq: Once | INTRAVENOUS | Status: AC | PRN
  Administered 2017-08-15: 500 [IU]
  Filled 2017-08-15: qty 5

## 2017-08-15 NOTE — Progress Notes (Signed)
See follow up. 

## 2017-08-18 ENCOUNTER — Other Ambulatory Visit: Payer: Self-pay | Admitting: Oncology

## 2017-08-18 NOTE — Progress Notes (Signed)
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   INTERVAL HISTORY: Patient returns to clinic today for further evaluation and consideration of cycle 5, day 15 of Taxol and Herceptin. She continues to have erythema isolated to her scalp for approximately 24-48 hours after her treatment which then resolves with topical cream. Her rash was improved with increasing her dexamethasone premedication.  She is otherwise tolerating her treatments well without significant side effects. She has no neurologic complaints. She denies any recent fevers or illnesses. She has a good appetite and has maintained her weight.  She has no chest pain, cough, or shortness of breath.  She has no nausea, vomiting, constipation, or diarrhea.  She has no urinary complaints. Patient offers no further specific complaints today.  REVIEW OF SYSTEMS:   Review of Systems  Constitutional: Negative.  Negative for fever, malaise/fatigue and weight loss.  HENT: Negative.  Negative for congestion.   Respiratory: Negative.  Negative for cough and shortness of breath.   Cardiovascular: Negative.  Negative for chest pain and leg swelling.  Gastrointestinal: Negative.  Negative for abdominal pain.  Genitourinary: Negative.   Musculoskeletal: Negative.   Skin: Negative.  Negative for itching and rash.  Neurological: Negative.  Negative for sensory change and weakness.  Psychiatric/Behavioral: The patient is nervous/anxious.    As per HPI. Otherwise, a complete review of systems is  negative.   PAST MEDICAL HISTORY:  Hypothyroidism, migraines, depression, asthma.  PAST SURGICAL HISTORY: Bilateral mastectomy with reconstruction, partial hysterectomy.  FAMILY HISTORY: Lung cancer, melanoma, stomach cancer.  Also diabetes, CAD, hypertension     ADVANCED DIRECTIVES:    HEALTH MAINTENANCE: Social History   Tobacco Use  . Smoking status: Never Smoker  . Smokeless tobacco: Never Used  Substance Use Topics  . Alcohol use: No  . Drug use: No     Colonoscopy:  PAP:  Bone density:  Lipid panel:  Allergies  Allergen Reactions  . Codeine Other (See Comments)    constipation  . Sulfa Antibiotics Other (See Comments)    Allergy as a child    Current Outpatient Medications  Medication Sig Dispense Refill  . alendronate (FOSAMAX) 70 MG tablet Take 70 mg by mouth once a week. Take with a full glass of water on an empty stomach.     . ALPRAZolam (XANAX) 0.25 MG tablet Take 1 tablet by mouth 3 (three) times daily as needed for anxiety.     Marland Kitchen azelastine (ASTELIN) 0.1 % nasal spray Place 1 spray into both nostrils daily as needed for rhinitis. Use in each nostril as directed    . calcium-vitamin D (OSCAL WITH D) 250-125 MG-UNIT tablet Take 1 tablet by mouth daily.    . Cholecalciferol (D3 HIGH POTENCY) 2000 units CAPS Take 1 capsule by mouth daily.    . cyclobenzaprine (FLEXERIL) 10 MG tablet Take 10 mg by mouth 3 (three) times daily as needed.     . DULoxetine (CYMBALTA) 30 MG capsule Take 90 mg by mouth every morning.     . fluticasone (FLOVENT HFA) 110 MCG/ACT inhaler Inhale 1 puff into the lungs 2 (  two) times daily.    Marland Kitchen levothyroxine (SYNTHROID, LEVOTHROID) 137 MCG tablet Take 137 mcg by mouth daily before breakfast.     . lidocaine-prilocaine (EMLA) cream Apply to affected area once 30 g 3  . metoprolol succinate (TOPROL-XL) 25 MG 24 hr tablet Take 75 mg by mouth at bedtime.     . Multiple Vitamin (MULTIVITAMIN) tablet Take 1 tablet by mouth daily.    .  ondansetron (ZOFRAN) 8 MG tablet Take 1 tablet (8 mg total) by mouth 2 (two) times daily as needed (Nausea or vomiting). 60 tablet 3  . prochlorperazine (COMPAZINE) 10 MG tablet Take 1 tablet (10 mg total) by mouth every 6 (six) hours as needed (Nausea or vomiting). 60 tablet 3  . topiramate (TOPAMAX) 50 MG tablet Take 50 mg by mouth at bedtime.     . traMADol (ULTRAM) 50 MG tablet Take 25-50 mg by mouth every 6 (six) hours as needed.     . Triamcinolone Acetonide (TRIAMCINOLONE 0.1 % CREAM : EUCERIN) CREA Apply 1 application topically 2 (two) times daily as needed. 1 each 0   No current facility-administered medications for this visit.     OBJECTIVE: Vitals:   08/21/17 0907  BP: 110/60  Pulse: 79  Resp: 18  Temp: 99.8 F (37.7 C)  SpO2: 97%     Body mass index is 35.02 kg/m.    ECOG FS:0 - Asymptomatic  General: Well-developed, well-nourished, no acute distress. Eyes: anicteric sclera. Breasts: Patient has bilateral mastectomies with reconstruction, no obvious evidence of local recurrence. Lungs: Clear to auscultation bilaterally. Port in place in the right chest wall. Heart: Regular rate and rhythm. No rubs, murmurs, or gallops. Abdomen: Soft, nontender, nondistended. No organomegaly noted, normoactive bowel sounds. Musculoskeletal: No edema, cyanosis, or clubbing. Neuro: Alert, answering all questions appropriately. Cranial nerves grossly intact. Skin: Mild erythema noted on scalp and torso. Psych: Normal affect.   LAB RESULTS:  Lab Results  Component Value Date   NA 136 07/23/2017   K 3.6 07/23/2017   CL 107 07/23/2017   CO2 19 (L) 07/23/2017   GLUCOSE 117 (H) 07/23/2017   BUN 13 07/23/2017   CREATININE 0.90 07/23/2017   CALCIUM 9.6 07/23/2017   PROT 7.4 07/23/2017   ALBUMIN 4.0 07/23/2017   AST 26 07/23/2017   ALT 18 07/23/2017   ALKPHOS 68 07/23/2017   BILITOT 0.6 07/23/2017   GFRNONAA >60 07/23/2017   GFRAA >60 07/23/2017    Lab Results  Component Value  Date   WBC 3.8 07/23/2017   NEUTROABS 2.8 07/23/2017   HGB 12.4 07/23/2017   HCT 36.3 07/23/2017   MCV 96.7 07/23/2017   PLT 331 07/23/2017     STUDIES: Nm Cardiac Muga Rest  Result Date: 07/31/2017 CLINICAL DATA:  RIGHT breast cancer, high risk chemotherapy EXAM: NUCLEAR MEDICINE CARDIAC BLOOD POOL IMAGING (MUGA) TECHNIQUE: Cardiac multi-gated acquisition was performed at rest following intravenous injection of Tc-41mlabeled red blood cells. RADIOPHARMACEUTICALS:  22.86 mCi Tc-913mertechnetate in-vitro labeled autologous red blood cells IV COMPARISON:  04/30/2017 FINDINGS: Calculated LEFT ventricular ejection fraction is 54%, decreased from the 64% on the previous exam. Study was obtained at a cardiac rate of 74 beats per minute. Patient was rhythmic during image acquisition. No focal wall motion abnormalities. IMPRESSION: LEFT ventricular ejection fraction is calculated at 54% decreased from the 64% on the previous exam. No focal wall motion abnormalities. Electronically Signed   By: MaLavonia Dana.D.   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    PLAN:    1. 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  She does have a new nodule in her left upper lobe that will require monitoring.  Previously, MRI of the brain was negative.  Patient's CA-27.29 is now within normal limits.  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  MUGA scan on July 30, 2017 reported EF of 54%.  Patient's laboratory work is adequate to proceed with cycle 5, day 15 of Taxol and Herceptin. Will give treatments on days 1, 8, and 15 with day 22 off. Plan to reimage again at the conclusion of cycle 6 and then consider possible maintenance Herceptin therapy.  Return to clinic in 2 weeks for consideration of cycle 6, day 1.  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 2. Brain metastasis: Patient has completed XRT. Repeat brain imaging at the conclusion of cycle 6.  3. 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 Continue follow-up with cardiology as indicated. 4.  Erythema/rash: Unclear etiology, possibly related to XRT. Isolated to her scalp and improving. Continue topical treatment.  Increase premedication of dexamethasone to 20 mg IV with each treatment.  5.  Cough/congestion: Resolved.  Patient expressed understanding and was in agreement with this plan. She also understands that She can call clinic at any time with any questions, concerns, or complaints.    Breast cancer   Staging form: Breast, AJCC 7th Edition     Clinical stage from 02/14/2015: Stage IA (T1c, N0, M0) - Signed by Lloyd Huger, MD on 02/14/2015   Lloyd Huger, MD   08/24/2017 10:26 AM

## 2017-08-19 DIAGNOSIS — C50411 Malignant neoplasm of upper-outer quadrant of right female breast: Secondary | ICD-10-CM | POA: Diagnosis not present

## 2017-08-21 ENCOUNTER — Inpatient Hospital Stay: Payer: 59

## 2017-08-21 ENCOUNTER — Inpatient Hospital Stay (HOSPITAL_BASED_OUTPATIENT_CLINIC_OR_DEPARTMENT_OTHER): Payer: 59 | Admitting: Oncology

## 2017-08-21 ENCOUNTER — Telehealth: Payer: Self-pay | Admitting: Pharmacist

## 2017-08-21 VITALS — BP 110/60 | HR 79 | Temp 99.8°F | Resp 18 | Wt 197.7 lb

## 2017-08-21 DIAGNOSIS — R918 Other nonspecific abnormal finding of lung field: Secondary | ICD-10-CM | POA: Diagnosis not present

## 2017-08-21 DIAGNOSIS — C50411 Malignant neoplasm of upper-outer quadrant of right female breast: Secondary | ICD-10-CM | POA: Diagnosis not present

## 2017-08-21 DIAGNOSIS — C7931 Secondary malignant neoplasm of brain: Secondary | ICD-10-CM

## 2017-08-21 DIAGNOSIS — Z5112 Encounter for antineoplastic immunotherapy: Secondary | ICD-10-CM | POA: Diagnosis not present

## 2017-08-21 DIAGNOSIS — L539 Erythematous condition, unspecified: Secondary | ICD-10-CM

## 2017-08-21 DIAGNOSIS — J948 Other specified pleural conditions: Secondary | ICD-10-CM

## 2017-08-21 DIAGNOSIS — Z171 Estrogen receptor negative status [ER-]: Secondary | ICD-10-CM

## 2017-08-21 MED ORDER — TRASTUZUMAB CHEMO 150 MG IV SOLR
2.0000 mg/kg | Freq: Once | INTRAVENOUS | Status: AC
  Administered 2017-08-21: 189 mg via INTRAVENOUS
  Filled 2017-08-21: qty 9

## 2017-08-21 MED ORDER — SODIUM CHLORIDE 0.9 % IV SOLN
20.0000 mg | Freq: Once | INTRAVENOUS | Status: AC
  Administered 2017-08-21: 20 mg via INTRAVENOUS
  Filled 2017-08-21: qty 2

## 2017-08-21 MED ORDER — SODIUM CHLORIDE 0.9 % IV SOLN
80.0000 mg/m2 | Freq: Once | INTRAVENOUS | Status: AC
  Administered 2017-08-21: 168 mg via INTRAVENOUS
  Filled 2017-08-21: qty 28

## 2017-08-21 MED ORDER — SODIUM CHLORIDE 0.9% FLUSH
10.0000 mL | INTRAVENOUS | Status: DC | PRN
  Administered 2017-08-21: 10 mL
  Filled 2017-08-21: qty 10

## 2017-08-21 MED ORDER — DEXAMETHASONE SODIUM PHOSPHATE 10 MG/ML IJ SOLN: 20.0000 mg | Freq: Once | INTRAMUSCULAR | Status: DC

## 2017-08-21 MED ORDER — SODIUM CHLORIDE 0.9 % IV SOLN
Freq: Once | INTRAVENOUS | Status: AC
  Administered 2017-08-21: 10:00:00 via INTRAVENOUS
  Filled 2017-08-21: qty 1000

## 2017-08-21 MED ORDER — DIPHENHYDRAMINE HCL 50 MG/ML IJ SOLN
25.0000 mg | Freq: Once | INTRAMUSCULAR | Status: AC
  Administered 2017-08-21: 25 mg via INTRAVENOUS
  Filled 2017-08-21: qty 1

## 2017-08-21 MED ORDER — HEPARIN SOD (PORK) LOCK FLUSH 100 UNIT/ML IV SOLN
500.0000 [IU] | Freq: Once | INTRAVENOUS | Status: AC | PRN
  Administered 2017-08-21: 500 [IU]
  Filled 2017-08-21 (×2): qty 5

## 2017-08-21 MED ORDER — FAMOTIDINE IN NACL 20-0.9 MG/50ML-% IV SOLN
20.0000 mg | Freq: Once | INTRAVENOUS | Status: AC
  Administered 2017-08-21: 20 mg via INTRAVENOUS
  Filled 2017-08-21: qty 50

## 2017-08-21 MED ORDER — DIPHENHYDRAMINE HCL 25 MG PO CAPS
25.0000 mg | ORAL_CAPSULE | Freq: Once | ORAL | Status: AC
  Filled 2017-08-21: qty 1

## 2017-08-21 MED ORDER — ACETAMINOPHEN 325 MG PO TABS
650.0000 mg | ORAL_TABLET | Freq: Once | ORAL | Status: AC
  Administered 2017-08-21: 650 mg via ORAL
  Filled 2017-08-21: qty 2

## 2017-08-21 NOTE — Telephone Encounter (Signed)
Reviewed [facility] labs prior to order verification.

## 2017-08-23 ENCOUNTER — Encounter: Payer: Self-pay | Admitting: Oncology

## 2017-08-28 DIAGNOSIS — C50411 Malignant neoplasm of upper-outer quadrant of right female breast: Secondary | ICD-10-CM | POA: Diagnosis not present

## 2017-09-02 DIAGNOSIS — C50411 Malignant neoplasm of upper-outer quadrant of right female breast: Secondary | ICD-10-CM | POA: Diagnosis not present

## 2017-09-02 NOTE — Progress Notes (Signed)
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   INTERVAL HISTORY: Patient returns to clinic today for further evaluation and consideration of cycle 6, day 1 of Taxol and Herceptin. She continues to have erythema isolated to her scalp for approximately 24-48 hours after her treatment which then resolves with topical cream. Her rash was improved with increasing her dexamethasone premedication.  She is otherwise tolerating her treatments well without significant side effects. She has no neurologic complaints. She denies any recent fevers or illnesses. She has a good appetite and has maintained her weight.  She has no chest pain, cough, or shortness of breath.  She has no nausea, vomiting, constipation, or diarrhea.  She has no urinary complaints. Patient offers no further specific complaints today.  REVIEW OF SYSTEMS:   Review of Systems  Constitutional: Negative.  Negative for fever, malaise/fatigue and weight loss.  HENT: Negative.  Negative for congestion.   Respiratory: Negative.  Negative for cough and shortness of breath.   Cardiovascular: Negative.  Negative for chest pain and leg swelling.  Gastrointestinal: Negative.  Negative for abdominal pain.  Genitourinary: Negative.   Musculoskeletal: Negative.   Skin: Negative.  Negative for itching and rash.  Neurological: Negative.  Negative for sensory change and weakness.  Psychiatric/Behavioral: The patient is nervous/anxious.    As per HPI. Otherwise, a complete review of systems is  negative.   PAST MEDICAL HISTORY:  Hypothyroidism, migraines, depression, asthma.  PAST SURGICAL HISTORY: Bilateral mastectomy with reconstruction, partial hysterectomy.  FAMILY HISTORY: Lung cancer, melanoma, stomach cancer.  Also diabetes, CAD, hypertension     ADVANCED DIRECTIVES:    HEALTH MAINTENANCE: Social History   Tobacco Use  . Smoking status: Never Smoker  . Smokeless tobacco: Never Used  Substance Use Topics  . Alcohol use: No  . Drug use: No     Colonoscopy:  PAP:  Bone density:  Lipid panel:  Allergies  Allergen Reactions  . Codeine Other (See Comments)    constipation  . Sulfa Antibiotics Other (See Comments)    Allergy as a child    Current Outpatient Medications  Medication Sig Dispense Refill  . alendronate (FOSAMAX) 70 MG tablet Take 70 mg by mouth once a week. Take with a full glass of water on an empty stomach.     . ALPRAZolam (XANAX) 0.25 MG tablet Take 1 tablet by mouth 3 (three) times daily as needed for anxiety.     Marland Kitchen azelastine (ASTELIN) 0.1 % nasal spray Place 1 spray into both nostrils daily as needed for rhinitis. Use in each nostril as directed    . calcium-vitamin D (OSCAL WITH D) 250-125 MG-UNIT tablet Take 1 tablet by mouth daily.    . Cholecalciferol (D3 HIGH POTENCY) 2000 units CAPS Take 1 capsule by mouth daily.    . cyclobenzaprine (FLEXERIL) 10 MG tablet Take 10 mg by mouth 3 (three) times daily as needed.     . DULoxetine (CYMBALTA) 30 MG capsule Take 90 mg by mouth every morning.     . fluticasone (FLOVENT HFA) 110 MCG/ACT inhaler Inhale 1 puff into the lungs 2 (  two) times daily.    Marland Kitchen levothyroxine (SYNTHROID, LEVOTHROID) 137 MCG tablet Take 137 mcg by mouth daily before breakfast.     . lidocaine-prilocaine (EMLA) cream Apply to affected area once 30 g 3  . metoprolol succinate (TOPROL-XL) 25 MG 24 hr tablet Take 75 mg by mouth at bedtime.     . Multiple Vitamin (MULTIVITAMIN) tablet Take 1 tablet by mouth daily.    .  ondansetron (ZOFRAN) 8 MG tablet Take 1 tablet (8 mg total) by mouth 2 (two) times daily as needed (Nausea or vomiting). 60 tablet 3  . prochlorperazine (COMPAZINE) 10 MG tablet Take 1 tablet (10 mg total) by mouth every 6 (six) hours as needed (Nausea or vomiting). 60 tablet 3  . topiramate (TOPAMAX) 50 MG tablet Take 50 mg by mouth at bedtime.     . traMADol (ULTRAM) 50 MG tablet Take 25-50 mg by mouth every 6 (six) hours as needed.     . Triamcinolone Acetonide (TRIAMCINOLONE 0.1 % CREAM : EUCERIN) CREA Apply 1 application topically 2 (two) times daily as needed. 1 each 0   No current facility-administered medications for this visit.     OBJECTIVE: Vitals:   09/04/17 0927  BP: 110/60  Pulse: 80  Resp: 18  Temp: 99.2 F (37.3 C)  SpO2: 100%     Body mass index is 34.88 kg/m.    ECOG FS:0 - Asymptomatic  General: Well-developed, well-nourished, no acute distress. Eyes: anicteric sclera. Breasts: Patient has bilateral mastectomies with reconstruction, no obvious evidence of local recurrence. Lungs: Clear to auscultation bilaterally. Port in place in the right chest wall. Heart: Regular rate and rhythm. No rubs, murmurs, or gallops. Abdomen: Soft, nontender, nondistended. No organomegaly noted, normoactive bowel sounds. Musculoskeletal: No edema, cyanosis, or clubbing. Neuro: Alert, answering all questions appropriately. Cranial nerves grossly intact. Skin: Mild erythema noted on scalp and torso. Psych: Normal affect.   LAB RESULTS:  Lab Results  Component Value Date   NA 136 07/23/2017   K 3.6 07/23/2017   CL 107 07/23/2017   CO2 19 (L) 07/23/2017   GLUCOSE 117 (H) 07/23/2017   BUN 13 07/23/2017   CREATININE 0.90 07/23/2017   CALCIUM 9.6 07/23/2017   PROT 7.4 07/23/2017   ALBUMIN 4.0 07/23/2017   AST 26 07/23/2017   ALT 18 07/23/2017   ALKPHOS 68 07/23/2017   BILITOT 0.6 07/23/2017   GFRNONAA >60 07/23/2017   GFRAA >60 07/23/2017    Lab Results  Component Value  Date   WBC 3.8 07/23/2017   NEUTROABS 2.8 07/23/2017   HGB 12.4 07/23/2017   HCT 36.3 07/23/2017   MCV 96.7 07/23/2017   PLT 331 07/23/2017     STUDIES: No results found.  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    PLAN:    1. 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  She does have a new nodule in her left upper lobe that will require monitoring.  Previously, MRI of the brain was negative.  Patient's CA-27.29 is now within normal limits.  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  MUGA scan on July 30, 2017 reported EF of 54%.  Patient's laboratory work is adequate to proceed with cycle 6, day 1 of Taxol and Herceptin. Will give treatments on days 1, 8, and 15 with day 22 off. Plan to reimage again at the conclusion of cycle 6 and then consider possible maintenance Herceptin therapy.  Return to clinic in 1 week for consideration of cycle 6, day 8.  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 2. Brain metastasis: Patient has completed XRT. Repeat brain imaging at the conclusion of cycle 6.  3. 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 Continue follow-up with cardiology as indicated. 4.  Erythema/rash: Unclear etiology, possibly related to XRT. Isolated to her scalp and improving. Continue topical treatment.  Increase  premedication of dexamethasone to 20 mg IV with each treatment.  5.  Cough/congestion: Resolved.  Patient expressed understanding and was in agreement with this plan. She also understands that She can call clinic at any time with any questions, concerns, or complaints.    Breast cancer   Staging form: Breast, AJCC 7th Edition     Clinical stage from 02/14/2015: Stage IA (T1c, N0, M0) - Signed by Lloyd Huger, MD on 02/14/2015   Lloyd Huger, MD   09/04/2017 9:39 AM

## 2017-09-04 ENCOUNTER — Other Ambulatory Visit: Payer: 59

## 2017-09-04 ENCOUNTER — Inpatient Hospital Stay (HOSPITAL_BASED_OUTPATIENT_CLINIC_OR_DEPARTMENT_OTHER): Payer: 59 | Admitting: Oncology

## 2017-09-04 ENCOUNTER — Inpatient Hospital Stay: Payer: 59

## 2017-09-04 VITALS — BP 110/60 | HR 80 | Temp 99.2°F | Resp 18 | Wt 196.9 lb

## 2017-09-04 DIAGNOSIS — L539 Erythematous condition, unspecified: Secondary | ICD-10-CM

## 2017-09-04 DIAGNOSIS — Z171 Estrogen receptor negative status [ER-]: Secondary | ICD-10-CM | POA: Diagnosis not present

## 2017-09-04 DIAGNOSIS — C7931 Secondary malignant neoplasm of brain: Secondary | ICD-10-CM | POA: Diagnosis not present

## 2017-09-04 DIAGNOSIS — C50411 Malignant neoplasm of upper-outer quadrant of right female breast: Secondary | ICD-10-CM

## 2017-09-04 DIAGNOSIS — Z5112 Encounter for antineoplastic immunotherapy: Secondary | ICD-10-CM | POA: Diagnosis not present

## 2017-09-04 DIAGNOSIS — R911 Solitary pulmonary nodule: Secondary | ICD-10-CM | POA: Diagnosis not present

## 2017-09-04 DIAGNOSIS — J948 Other specified pleural conditions: Secondary | ICD-10-CM

## 2017-09-04 MED ORDER — DEXAMETHASONE SODIUM PHOSPHATE 10 MG/ML IJ SOLN: 20.0000 mg | Freq: Once | INTRAMUSCULAR | Status: DC

## 2017-09-04 MED ORDER — SODIUM CHLORIDE 0.9 % IV SOLN
80.0000 mg/m2 | Freq: Once | INTRAVENOUS | Status: AC
  Administered 2017-09-04: 168 mg via INTRAVENOUS
  Filled 2017-09-04: qty 28

## 2017-09-04 MED ORDER — SODIUM CHLORIDE 0.9 % IV SOLN
20.0000 mg | Freq: Once | INTRAVENOUS | Status: AC
  Administered 2017-09-04: 20 mg via INTRAVENOUS
  Filled 2017-09-04: qty 2

## 2017-09-04 MED ORDER — PACLITAXEL CHEMO INJECTION 300 MG/50ML: 80.0000 mg/m2 | Freq: Once | INTRAVENOUS | Status: DC

## 2017-09-04 MED ORDER — HEPARIN SOD (PORK) LOCK FLUSH 100 UNIT/ML IV SOLN
500.0000 [IU] | Freq: Once | INTRAVENOUS | Status: AC | PRN
  Administered 2017-09-04: 500 [IU]
  Filled 2017-09-04: qty 5

## 2017-09-04 MED ORDER — ACETAMINOPHEN 325 MG PO TABS
650.0000 mg | ORAL_TABLET | Freq: Once | ORAL | Status: AC
  Administered 2017-09-04: 650 mg via ORAL
  Filled 2017-09-04: qty 2

## 2017-09-04 MED ORDER — TRASTUZUMAB CHEMO 150 MG IV SOLR
2.0000 mg/kg | Freq: Once | INTRAVENOUS | Status: AC
  Administered 2017-09-04: 189 mg via INTRAVENOUS
  Filled 2017-09-04: qty 9

## 2017-09-04 MED ORDER — SODIUM CHLORIDE 0.9% FLUSH
10.0000 mL | INTRAVENOUS | Status: DC | PRN
  Administered 2017-09-04: 10 mL
  Filled 2017-09-04: qty 10

## 2017-09-04 MED ORDER — DIPHENHYDRAMINE HCL 50 MG/ML IJ SOLN
25.0000 mg | Freq: Once | INTRAMUSCULAR | Status: AC
  Administered 2017-09-04: 25 mg via INTRAVENOUS
  Filled 2017-09-04: qty 1

## 2017-09-04 MED ORDER — FAMOTIDINE IN NACL 20-0.9 MG/50ML-% IV SOLN
20.0000 mg | Freq: Once | INTRAVENOUS | Status: AC
  Administered 2017-09-04: 20 mg via INTRAVENOUS
  Filled 2017-09-04: qty 50

## 2017-09-04 MED ORDER — SODIUM CHLORIDE 0.9 % IV SOLN
Freq: Once | INTRAVENOUS | Status: AC
  Administered 2017-09-04: 10:00:00 via INTRAVENOUS
  Filled 2017-09-04: qty 1000

## 2017-09-04 NOTE — Progress Notes (Signed)
Patient is here for follow up and treatment. She is doing well no complaints

## 2017-09-08 NOTE — Progress Notes (Signed)
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   INTERVAL HISTORY: Patient returns to clinic today for further evaluation and consideration of cycle 6, day 8 of Taxol and Herceptin.  She is tolerating her treatments well without significant side effects. She has no neurologic complaints. She denies any recent fevers or illnesses. She has a good appetite and has maintained her weight.  She has no chest pain, cough, or shortness of breath.  She has no nausea, vomiting, constipation, or diarrhea.  She has no urinary complaints. Patient offers no specific complaints today.  REVIEW OF SYSTEMS:   Review of Systems  Constitutional: Negative.  Negative for fever, malaise/fatigue and weight loss.  HENT: Negative.  Negative for congestion.   Respiratory: Negative.  Negative for cough and shortness of breath.   Cardiovascular: Negative.  Negative for chest pain and leg swelling.  Gastrointestinal: Negative.  Negative for abdominal pain.  Genitourinary: Negative.   Musculoskeletal: Negative.   Skin: Negative.  Negative for itching and rash.  Neurological: Negative.  Negative for sensory change and weakness.  Psychiatric/Behavioral: The patient is nervous/anxious.    As per HPI. Otherwise, a complete review of systems is negative.   PAST MEDICAL HISTORY:  Hypothyroidism, migraines, depression, asthma.  PAST SURGICAL HISTORY: Bilateral mastectomy with reconstruction, partial hysterectomy.  FAMILY HISTORY: Lung cancer, melanoma, stomach cancer.  Also  diabetes, CAD, hypertension     ADVANCED DIRECTIVES:    HEALTH MAINTENANCE: Social History   Tobacco Use  . Smoking status: Never Smoker  . Smokeless tobacco: Never Used  Substance Use Topics  . Alcohol use: No  . Drug use: No     Colonoscopy:  PAP:  Bone density:  Lipid panel:  Allergies  Allergen Reactions  . Codeine Other (See Comments)    constipation  . Sulfa Antibiotics Other (See Comments)    Allergy as a child    Current Outpatient Medications  Medication Sig Dispense Refill  . alendronate (FOSAMAX) 70 MG tablet Take 70 mg by mouth once a week. Take with a full glass of water on an empty stomach.     . ALPRAZolam (XANAX) 0.25 MG tablet Take 1 tablet by mouth 3 (three) times daily as needed for anxiety.     Marland Kitchen azelastine (ASTELIN) 0.1 % nasal spray Place 1 spray into both nostrils daily as needed for rhinitis. Use in each nostril as directed    . calcium-vitamin D (OSCAL WITH D) 250-125 MG-UNIT tablet Take 1 tablet by mouth daily.    . Cholecalciferol (D3 HIGH POTENCY) 2000 units CAPS Take 1 capsule by mouth daily.    . cyclobenzaprine (FLEXERIL) 10 MG tablet Take 10 mg by mouth 3 (three) times daily as needed.     . DULoxetine (CYMBALTA) 30 MG capsule Take 90 mg by mouth every morning.     . fluticasone (FLOVENT HFA) 110 MCG/ACT inhaler Inhale 1 puff into the lungs 2 (two) times daily.    Marland Kitchen levothyroxine (SYNTHROID, LEVOTHROID) 137 MCG tablet Take 137 mcg by mouth daily before breakfast.     . lidocaine-prilocaine (EMLA) cream Apply to affected area  once 30 g 3  . metoprolol succinate (TOPROL-XL) 25 MG 24 hr tablet Take 75 mg by mouth at bedtime.     . Multiple Vitamin (MULTIVITAMIN) tablet Take 1 tablet by mouth daily.    . ondansetron (ZOFRAN) 8 MG tablet Take 1 tablet (8 mg total) by mouth 2 (two) times daily as needed (Nausea or vomiting). 60 tablet 3  . prochlorperazine (COMPAZINE) 10 MG tablet Take 1 tablet (10 mg total) by mouth every 6 (six) hours as needed  (Nausea or vomiting). 60 tablet 3  . topiramate (TOPAMAX) 50 MG tablet Take 50 mg by mouth at bedtime.     . traMADol (ULTRAM) 50 MG tablet Take 25-50 mg by mouth every 6 (six) hours as needed.     . Triamcinolone Acetonide (TRIAMCINOLONE 0.1 % CREAM : EUCERIN) CREA Apply 1 application topically 2 (two) times daily as needed. 1 each 0   No current facility-administered medications for this visit.    Facility-Administered Medications Ordered in Other Visits  Medication Dose Route Frequency Provider Last Rate Last Dose  . 0.9 %  sodium chloride infusion   Intravenous Once Lloyd Huger, MD      . acetaminophen (TYLENOL) tablet 650 mg  650 mg Oral Once Lloyd Huger, MD      . dexamethasone (DECADRON) 20 mg in sodium chloride 0.9 % 50 mL IVPB  20 mg Intravenous Once Lloyd Huger, MD      . diphenhydrAMINE (BENADRYL) injection 25 mg  25 mg Intravenous Once Lloyd Huger, MD      . famotidine (PEPCID) IVPB 20 mg premix  20 mg Intravenous Once Lloyd Huger, MD      . PACLitaxel (TAXOL) 168 mg in sodium chloride 0.9 % 250 mL chemo infusion (</= 39m/m2)  80 mg/m2 (Treatment Plan Recorded) Intravenous Once FLloyd Huger MD      . trastuzumab (HERCEPTIN) 189 mg in sodium chloride 0.9 % 250 mL chemo infusion  2 mg/kg (Treatment Plan Recorded) Intravenous Once FLloyd Huger MD        OBJECTIVE: Vitals:   09/11/17 0954  BP: 100/60  Pulse: 90  Resp: 18  SpO2: 99%     Body mass index is 34.4 kg/m.    ECOG FS:0 - Asymptomatic  General: Well-developed, well-nourished, no acute distress. Eyes: anicteric sclera. Breasts: Patient has bilateral mastectomies with reconstruction, no obvious evidence of local recurrence. Lungs: Clear to auscultation bilaterally. Port in place in the right chest wall. Heart: Regular rate and rhythm. No rubs, murmurs, or gallops. Abdomen: Soft, nontender, nondistended. No organomegaly noted, normoactive bowel  sounds. Musculoskeletal: No edema, cyanosis, or clubbing. Neuro: Alert, answering all questions appropriately. Cranial nerves grossly intact. Skin: Mild erythema noted on scalp and torso. Psych: Normal affect.   LAB RESULTS:  Lab Results  Component Value Date   NA 136 07/23/2017   K 3.6 07/23/2017   CL 107 07/23/2017   CO2 19 (L) 07/23/2017   GLUCOSE 117 (H) 07/23/2017   BUN 13 07/23/2017   CREATININE 0.90 07/23/2017   CALCIUM 9.6 07/23/2017   PROT 7.4 07/23/2017   ALBUMIN 4.0 07/23/2017   AST 26 07/23/2017   ALT 18 07/23/2017   ALKPHOS 68 07/23/2017   BILITOT 0.6 07/23/2017   GFRNONAA >60 07/23/2017   GFRAA >60 07/23/2017    Lab Results  Component Value Date   WBC 3.8 07/23/2017   NEUTROABS 2.8 07/23/2017   HGB 12.4 07/23/2017   HCT 36.3 07/23/2017  MCV 96.7 07/23/2017   PLT 331 07/23/2017     STUDIES: No results found.  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    PLAN:    1. 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  She does have a new nodule in her left upper lobe that will require monitoring.  Previously, MRI of the brain was negative.  Patient's CA-27.29 is now within normal limits.  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  MUGA scan on July 30, 2017 reported EF of 54%.  Patient's laboratory work is adequate to proceed  with cycle 6, day 8 of Taxol and Herceptin. Will give treatments on days 1, 8, and 15 with day 22 off.  Return to clinic in 1 week for consideration of cycle 6, day 15.  Patient will then return to clinic in 4 weeks with repeat imaging using CT scan and MRI of the brain, further evaluation, and continuation of maintenance Herceptin every 3 weeks.  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 2. Brain metastasis: Patient has completed XRT. Repeat brain imaging at the conclusion of cycle 6 as above.  3. 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 Continue follow-up with cardiology as indicated. 4.  Erythema/rash: Unclear etiology, possibly related to XRT. Isolated to her scalp and improving. Continue topical treatment.  Increase premedication of dexamethasone to 20 mg IV with each treatment.  5.  Cough/congestion: Resolved.  Patient expressed understanding and was in agreement with this plan. She also understands that She can call clinic at any time with any questions, concerns, or complaints.    Breast cancer   Staging form: Breast, AJCC 7th Edition     Clinical stage from 02/14/2015: Stage IA (T1c, N0, M0) - Signed by Lloyd Huger, MD on 02/14/2015   Lloyd Huger, MD   09/11/2017 10:49 AM

## 2017-09-09 DIAGNOSIS — C50411 Malignant neoplasm of upper-outer quadrant of right female breast: Secondary | ICD-10-CM | POA: Diagnosis not present

## 2017-09-11 ENCOUNTER — Inpatient Hospital Stay: Payer: 59 | Admitting: Oncology

## 2017-09-11 ENCOUNTER — Encounter: Payer: Self-pay | Admitting: Oncology

## 2017-09-11 ENCOUNTER — Inpatient Hospital Stay: Payer: 59

## 2017-09-11 VITALS — BP 100/60 | HR 90 | Resp 18 | Wt 194.2 lb

## 2017-09-11 DIAGNOSIS — C50411 Malignant neoplasm of upper-outer quadrant of right female breast: Secondary | ICD-10-CM | POA: Diagnosis not present

## 2017-09-11 DIAGNOSIS — Z5112 Encounter for antineoplastic immunotherapy: Secondary | ICD-10-CM | POA: Diagnosis not present

## 2017-09-11 MED ORDER — TRASTUZUMAB CHEMO 150 MG IV SOLR
2.0000 mg/kg | Freq: Once | INTRAVENOUS | Status: AC
  Administered 2017-09-11: 189 mg via INTRAVENOUS
  Filled 2017-09-11: qty 9

## 2017-09-11 MED ORDER — SODIUM CHLORIDE 0.9 % IV SOLN
20.0000 mg | Freq: Once | INTRAVENOUS | Status: AC
  Administered 2017-09-11: 20 mg via INTRAVENOUS
  Filled 2017-09-11: qty 2

## 2017-09-11 MED ORDER — DIPHENHYDRAMINE HCL 50 MG/ML IJ SOLN
25.0000 mg | Freq: Once | INTRAMUSCULAR | Status: AC
  Administered 2017-09-11: 25 mg via INTRAVENOUS
  Filled 2017-09-11: qty 1

## 2017-09-11 MED ORDER — ACETAMINOPHEN 325 MG PO TABS
650.0000 mg | ORAL_TABLET | Freq: Once | ORAL | Status: AC
  Administered 2017-09-11: 650 mg via ORAL
  Filled 2017-09-11: qty 2

## 2017-09-11 MED ORDER — SODIUM CHLORIDE 0.9% FLUSH
10.0000 mL | INTRAVENOUS | Status: DC | PRN
  Administered 2017-09-11: 10 mL via INTRAVENOUS
  Filled 2017-09-11: qty 10

## 2017-09-11 MED ORDER — SODIUM CHLORIDE 0.9 % IV SOLN
Freq: Once | INTRAVENOUS | Status: AC
  Administered 2017-09-11: 11:00:00 via INTRAVENOUS
  Filled 2017-09-11: qty 1000

## 2017-09-11 MED ORDER — FAMOTIDINE IN NACL 20-0.9 MG/50ML-% IV SOLN
20.0000 mg | Freq: Once | INTRAVENOUS | Status: AC
  Administered 2017-09-11: 20 mg via INTRAVENOUS
  Filled 2017-09-11: qty 50

## 2017-09-11 MED ORDER — HEPARIN SOD (PORK) LOCK FLUSH 100 UNIT/ML IV SOLN
500.0000 [IU] | Freq: Once | INTRAVENOUS | Status: AC
  Administered 2017-09-11: 500 [IU] via INTRAVENOUS

## 2017-09-11 MED ORDER — PACLITAXEL CHEMO INJECTION 300 MG/50ML
80.0000 mg/m2 | Freq: Once | INTRAVENOUS | Status: AC
  Administered 2017-09-11: 168 mg via INTRAVENOUS
  Filled 2017-09-11: qty 28

## 2017-09-11 NOTE — Progress Notes (Signed)
  Oncology Nurse Navigator Documentation  Navigator Location: [facility] Onc (09/11/17 1100)   )Navigator Encounter Type: Clinic/MDC (09/11/17 1100)                     Patient Visit Type: MedOnc (09/11/17 1100) Treatment Phase: Active Tx (09/11/17 1100)                            Time Spent with Patient: 15 (09/11/17 1100)   CT, and MRI scheduled 09/25/17.

## 2017-09-12 ENCOUNTER — Encounter: Payer: Self-pay | Admitting: Oncology

## 2017-09-14 ENCOUNTER — Other Ambulatory Visit: Payer: Self-pay | Admitting: Oncology

## 2017-09-16 DIAGNOSIS — C50411 Malignant neoplasm of upper-outer quadrant of right female breast: Secondary | ICD-10-CM | POA: Diagnosis not present

## 2017-09-18 ENCOUNTER — Inpatient Hospital Stay: Payer: 59 | Attending: Oncology

## 2017-09-18 ENCOUNTER — Other Ambulatory Visit (HOSPITAL_COMMUNITY): Payer: Self-pay | Admitting: Oncology

## 2017-09-18 ENCOUNTER — Telehealth: Payer: Self-pay

## 2017-09-18 ENCOUNTER — Other Ambulatory Visit: Payer: Self-pay | Admitting: *Deleted

## 2017-09-18 VITALS — BP 109/73 | HR 88 | Temp 98.5°F | Resp 18 | Wt 193.8 lb

## 2017-09-18 DIAGNOSIS — C7931 Secondary malignant neoplasm of brain: Secondary | ICD-10-CM | POA: Diagnosis not present

## 2017-09-18 DIAGNOSIS — C50411 Malignant neoplasm of upper-outer quadrant of right female breast: Secondary | ICD-10-CM | POA: Diagnosis not present

## 2017-09-18 DIAGNOSIS — R399 Unspecified symptoms and signs involving the genitourinary system: Secondary | ICD-10-CM

## 2017-09-18 DIAGNOSIS — Z5112 Encounter for antineoplastic immunotherapy: Secondary | ICD-10-CM | POA: Diagnosis not present

## 2017-09-18 LAB — URINALYSIS, COMPLETE (UACMP) WITH MICROSCOPIC
Bilirubin Urine: NEGATIVE
GLUCOSE, UA: NEGATIVE mg/dL
Ketones, ur: NEGATIVE mg/dL
NITRITE: NEGATIVE
PROTEIN: 30 mg/dL — AB
SPECIFIC GRAVITY, URINE: 1.011 (ref 1.005–1.030)
pH: 6 (ref 5.0–8.0)

## 2017-09-18 MED ORDER — DIPHENHYDRAMINE HCL 25 MG PO CAPS
25.0000 mg | ORAL_CAPSULE | Freq: Once | ORAL | Status: AC
  Administered 2017-09-18: 25 mg via ORAL
  Filled 2017-09-18: qty 1

## 2017-09-18 MED ORDER — SODIUM CHLORIDE 0.9% FLUSH
10.0000 mL | INTRAVENOUS | Status: DC | PRN
  Filled 2017-09-18: qty 10

## 2017-09-18 MED ORDER — HEPARIN SOD (PORK) LOCK FLUSH 100 UNIT/ML IV SOLN
500.0000 [IU] | Freq: Once | INTRAVENOUS | Status: AC | PRN
  Administered 2017-09-18: 500 [IU]
  Filled 2017-09-18: qty 5

## 2017-09-18 MED ORDER — PHENAZOPYRIDINE HCL 100 MG PO TABS: 100.0000 mg | ORAL_TABLET | Freq: Three times a day (TID) | ORAL | 0 refills | Status: DC | PRN

## 2017-09-18 MED ORDER — ACETAMINOPHEN 325 MG PO TABS
650.0000 mg | ORAL_TABLET | Freq: Once | ORAL | Status: AC
  Administered 2017-09-18: 650 mg via ORAL
  Filled 2017-09-18: qty 2

## 2017-09-18 MED ORDER — TRASTUZUMAB CHEMO 150 MG IV SOLR: 168.0000 mg | Freq: Once | INTRAVENOUS | Status: DC

## 2017-09-18 MED ORDER — CIPROFLOXACIN HCL 500 MG PO TABS: 500.0000 mg | ORAL_TABLET | Freq: Two times a day (BID) | ORAL | 0 refills | Status: DC

## 2017-09-18 MED ORDER — TRASTUZUMAB CHEMO 150 MG IV SOLR
168.0000 mg | Freq: Once | INTRAVENOUS | Status: AC
  Administered 2017-09-18: 168 mg via INTRAVENOUS
  Filled 2017-09-18: qty 8

## 2017-09-18 MED ORDER — SODIUM CHLORIDE 0.9 % IV SOLN
Freq: Once | INTRAVENOUS | Status: AC
  Administered 2017-09-18: 10:00:00 via INTRAVENOUS
  Filled 2017-09-18: qty 1000

## 2017-09-18 NOTE — Progress Notes (Signed)
Patient was having recurrent UTI symptoms including dysuria, urgency and frequency.  Patient was recently prescribed antibiotics a few weeks ago by Dr. Grayland Ormond and symptoms most completely resolved but beginning approximately 2 days ago she began experiencing symptoms listed above.  Prescribed Pyridium 100 mg daily for 10 days.  Ciprofloxacin 500 mg twice daily for 5 days.   UA and urine culture was re-collected today.  UA indicate small amount of blood, large amount of leukocytes but no ketones or nitrates.  Patient will be reevaluated by Dr. Grayland Ormond at next clinic visit.  Patient to call with any concerns or questions.  Faythe Casa, NP 09/18/2017 3:14 PM

## 2017-09-18 NOTE — Telephone Encounter (Signed)
Patient c/o dysuria, frequency, and urgency today.  Reports that she was having same symptoms with hematuria about 3 weeks ago and was treated with antibiotic by Dr. Ola Spurr but her symptoms did not completely resolve.

## 2017-09-18 NOTE — Progress Notes (Signed)
Patient c/o dysuria (see telephone note).  Notified Lennon Alstrom, NP and she would like patient to provide urine sample for UA/culture.

## 2017-09-25 ENCOUNTER — Ambulatory Visit
Admission: RE | Admit: 2017-09-25 | Discharge: 2017-09-25 | Disposition: A | Discharge status: Home / Self Care | Payer: 59 | Source: Ambulatory Visit | Attending: Oncology | Admitting: Oncology

## 2017-09-25 ENCOUNTER — Encounter: Payer: Self-pay | Admitting: Oncology

## 2017-09-25 DIAGNOSIS — D320 Benign neoplasm of cerebral meninges: Secondary | ICD-10-CM | POA: Insufficient documentation

## 2017-09-25 DIAGNOSIS — C50411 Malignant neoplasm of upper-outer quadrant of right female breast: Secondary | ICD-10-CM | POA: Diagnosis not present

## 2017-09-25 DIAGNOSIS — Z90710 Acquired absence of both cervix and uterus: Secondary | ICD-10-CM | POA: Insufficient documentation

## 2017-09-25 DIAGNOSIS — C7931 Secondary malignant neoplasm of brain: Secondary | ICD-10-CM | POA: Diagnosis not present

## 2017-09-25 DIAGNOSIS — N839 Noninflammatory disorder of ovary, fallopian tube and broad ligament, unspecified: Secondary | ICD-10-CM | POA: Insufficient documentation

## 2017-09-25 DIAGNOSIS — C50919 Malignant neoplasm of unspecified site of unspecified female breast: Secondary | ICD-10-CM | POA: Diagnosis not present

## 2017-09-25 DIAGNOSIS — K769 Liver disease, unspecified: Secondary | ICD-10-CM | POA: Diagnosis not present

## 2017-09-25 DIAGNOSIS — N200 Calculus of kidney: Secondary | ICD-10-CM | POA: Diagnosis not present

## 2017-09-25 LAB — POCT I-STAT CREATININE: Creatinine, Ser: 0.6 mg/dL (ref 0.44–1.00)

## 2017-09-25 MED ORDER — HEPARIN SOD (PORK) LOCK FLUSH 100 UNIT/ML IV SOLN
500.0000 [IU] | INTRAVENOUS | Status: AC | PRN
  Administered 2017-09-25: 500 [IU]

## 2017-09-25 MED ORDER — IOPAMIDOL (ISOVUE-300) INJECTION 61%
100.0000 mL | Freq: Once | INTRAVENOUS | Status: AC | PRN
  Administered 2017-09-25: 100 mL via INTRAVENOUS

## 2017-09-25 MED ORDER — GADOBENATE DIMEGLUMINE 529 MG/ML IV SOLN
20.0000 mL | Freq: Once | INTRAVENOUS | Status: AC | PRN
  Administered 2017-09-25: 18 mL via INTRAVENOUS

## 2017-10-06 NOTE — Progress Notes (Signed)
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   INTERVAL HISTORY: Patient returns to clinic today for further evaluation, discussion of her imaging results, and initiation of maintenance Herceptin.  She currently feels well and is asymptomatic. She has no neurologic complaints. She denies any recent fevers or illnesses. She has a good appetite and has maintained her weight.  She has no chest pain, cough, or shortness of breath.  She has no nausea, vomiting, constipation, or diarrhea.  She has no urinary complaints. Patient offers no specific complaints today.  REVIEW OF SYSTEMS:   Review of Systems  Constitutional: Negative.  Negative for fever, malaise/fatigue and weight loss.  HENT: Negative.  Negative for congestion.   Respiratory: Negative.  Negative for cough and shortness of breath.   Cardiovascular: Negative.  Negative for chest pain and leg swelling.  Gastrointestinal: Negative.  Negative for abdominal pain.  Genitourinary: Negative.   Musculoskeletal: Negative.   Skin: Negative.  Negative for itching and rash.  Neurological: Negative.  Negative for sensory change and weakness.  Psychiatric/Behavioral: The patient is nervous/anxious.    As per HPI. Otherwise, a complete review of systems is negative.   PAST MEDICAL HISTORY:  Hypothyroidism, migraines, depression, asthma.  PAST SURGICAL HISTORY: Bilateral mastectomy with reconstruction, partial hysterectomy.  FAMILY HISTORY: Lung cancer, melanoma, stomach cancer.  Also diabetes, CAD,  hypertension     ADVANCED DIRECTIVES:    HEALTH MAINTENANCE: Social History   Tobacco Use  . Smoking status: Never Smoker  . Smokeless tobacco: Never Used  Substance Use Topics  . Alcohol use: No  . Drug use: No     Colonoscopy:  PAP:  Bone density:  Lipid panel:  Allergies  Allergen Reactions  . Codeine Other (See Comments)    constipation  . Sulfa Antibiotics Other (See Comments)    Allergy as a child    Current Outpatient Medications  Medication Sig Dispense Refill  . alendronate (FOSAMAX) 70 MG tablet Take 70 mg by mouth once a week. Take with a full glass of water on an empty stomach.     . ALPRAZolam (XANAX) 0.25 MG tablet Take 1 tablet by mouth 3 (three) times daily as needed for anxiety.     Marland Kitchen azelastine (ASTELIN) 0.1 % nasal spray Place 1 spray into both nostrils daily as needed for rhinitis. Use in each nostril as directed    . calcium-vitamin D (OSCAL WITH D) 250-125 MG-UNIT tablet Take 1 tablet by mouth daily.    . Cholecalciferol (D3 HIGH POTENCY) 2000 units CAPS Take 1 capsule by mouth daily.    . cyclobenzaprine (FLEXERIL) 10 MG tablet Take 10 mg by mouth 3 (three) times daily as needed.     . DULoxetine (CYMBALTA) 30 MG capsule Take 90 mg by mouth every morning.     . fluticasone (FLOVENT HFA) 110 MCG/ACT inhaler Inhale 1 puff into the lungs 2 (two) times daily.    Marland Kitchen levothyroxine (SYNTHROID, LEVOTHROID) 137 MCG tablet Take 137 mcg by mouth daily before breakfast.     . lidocaine-prilocaine (EMLA) cream Apply to affected area once 30 g 3  .  metoprolol succinate (TOPROL-XL) 25 MG 24 hr tablet Take 75 mg by mouth at bedtime.     . Multiple Vitamin (MULTIVITAMIN) tablet Take 1 tablet by mouth daily.    . ondansetron (ZOFRAN) 8 MG tablet Take 1 tablet (8 mg total) by mouth 2 (two) times daily as needed (Nausea or vomiting). 60 tablet 3  . phenazopyridine (PYRIDIUM) 100 MG tablet Take 1 tablet (100 mg total) by mouth 3 (three) times daily as needed for pain. 10  tablet 0  . prochlorperazine (COMPAZINE) 10 MG tablet Take 1 tablet (10 mg total) by mouth every 6 (six) hours as needed (Nausea or vomiting). 60 tablet 3  . topiramate (TOPAMAX) 50 MG tablet Take 50 mg by mouth at bedtime.     . traMADol (ULTRAM) 50 MG tablet Take 25-50 mg by mouth every 6 (six) hours as needed.     . Triamcinolone Acetonide (TRIAMCINOLONE 0.1 % CREAM : EUCERIN) CREA Apply 1 application topically 2 (two) times daily as needed. 1 each 0   No current facility-administered medications for this visit.     OBJECTIVE: Vitals:   10/09/17 0846 10/09/17 0851  BP:  94/67  Pulse:  76  Resp: 12   Temp:  98.9 F (37.2 C)     Body mass index is 34.4 kg/m.    ECOG FS:0 - Asymptomatic  General: Well-developed, well-nourished, no acute distress. Eyes: anicteric sclera. Breasts: Patient has bilateral mastectomies with reconstruction, no obvious evidence of local recurrence. Lungs: Clear to auscultation bilaterally. Port in place in the right chest wall. Heart: Regular rate and rhythm. No rubs, murmurs, or gallops. Abdomen: Soft, nontender, nondistended. No organomegaly noted, normoactive bowel sounds. Musculoskeletal: No edema, cyanosis, or clubbing. Neuro: Alert, answering all questions appropriately. Cranial nerves grossly intact. Skin: No rashes or petechiae noted. Psych: Normal affect.   LAB RESULTS:  Lab Results  Component Value Date   NA 136 07/23/2017   K 3.6 07/23/2017   CL 107 07/23/2017   CO2 19 (L) 07/23/2017   GLUCOSE 117 (H) 07/23/2017   BUN 13 07/23/2017   CREATININE 0.60 09/25/2017   CALCIUM 9.6 07/23/2017   PROT 7.4 07/23/2017   ALBUMIN 4.0 07/23/2017   AST 26 07/23/2017   ALT 18 07/23/2017   ALKPHOS 68 07/23/2017   BILITOT 0.6 07/23/2017   GFRNONAA >60 07/23/2017   GFRAA >60 07/23/2017    Lab Results  Component Value Date   WBC 3.8 07/23/2017   NEUTROABS 2.8 07/23/2017   HGB 12.4 07/23/2017   HCT 36.3 07/23/2017   MCV 96.7 07/23/2017   PLT  331 07/23/2017     STUDIES: Ct Chest W Contrast  Result Date: 09/25/2017 CLINICAL DATA:  Breast cancer. EXAM: CT CHEST, ABDOMEN, AND PELVIS WITH CONTRAST TECHNIQUE: Multidetector CT imaging of the chest, abdomen and pelvis was performed following the standard protocol during bolus administration of intravenous contrast. CONTRAST:  170m ISOVUE-300 IOPAMIDOL (ISOVUE-300) INJECTION 61% COMPARISON:  07/09/2017. FINDINGS: CT CHEST FINDINGS Cardiovascular: Right IJ Port-A-Cath terminates in the right atrium. Heart is mildly enlarged. No pericardial effusion. Mediastinum/Nodes: No pathologically enlarged mediastinal, hilar, internal mammary or axillary lymph nodes. Largest left axillary lymph node measures 9 mm, stable. Esophagus is grossly unremarkable. Lungs/Pleura: Biapical pleuroparenchymal scarring. Mild scattered peribronchovascular nodularity, likely postinfectious in etiology. 4 mm nodule in the right middle lobe, previously 7 mm. Small area cystic scarring in the lingula, corresponding to consolidation on the prior study. Lungs are otherwise clear. No pleural fluid. Airway is unremarkable. Musculoskeletal: No worrisome  lytic or sclerotic lesions. CT ABDOMEN PELVIS FINDINGS Hepatobiliary: Subcentimeter low-attenuation lesion in the left hepatic lobe, unchanged. Liver and gallbladder are otherwise unremarkable. No biliary ductal dilatation. Pancreas: Negative. Spleen: Negative. Adrenals/Urinary Tract: Adrenal glands are unremarkable. Small stones are seen in the kidneys, left greater than right. Subcentimeter low-attenuation lesions in the kidney left kidney are too small to characterize but statistically, cysts are most likely. Ureters are decompressed. Bladder is grossly unremarkable. Stomach/Bowel: Stomach, small bowel, appendix and colon are unremarkable. Vascular/Lymphatic: Vascular structures are unremarkable. No pathologically enlarged lymph nodes. Reproductive: Hysterectomy. 1.8 cm low-attenuation  lesion in the left adnexa, likely associated with the left ovary, as before. Other: No free fluid.  Mesenteries and peritoneum are unremarkable. Musculoskeletal: Degenerative changes in the spine. No worrisome lytic or sclerotic lesions. IMPRESSION: 1. No evidence of metastatic disease. 2. Resolved lingular consolidation with residual cystic scarring. 3. Bilateral renal stones. 4. Low-attenuation left ovarian lesion, stable. Consider baseline pelvic ultrasound, as clinically indicated. Electronically Signed   By: Lorin Picket M.D.   On: 09/25/2017 14:34   Mr Jeri Cos NG Contrast  Result Date: 09/25/2017 CLINICAL DATA:  Metastatic breast cancer. Chemo and radiation treatment. Follow-up metastatic disease to brain. EXAM: MRI HEAD WITHOUT AND WITH CONTRAST TECHNIQUE: Multiplanar, multiecho pulse sequences of the brain and surrounding structures were obtained without and with intravenous contrast. CONTRAST:  34m MULTIHANCE GADOBENATE DIMEGLUMINE 529 MG/ML IV SOLN COMPARISON:  MRI head 04/03/2017 FINDINGS: Brain: Image quality degraded by motion. Postcontrast images are moderately degraded by motion Metastatic deposit left putamen markedly improved Metastatic deposit posterior limb internal capsule improved Lesion left splenium of corpus callosum improved and no longer visualized Left parietal cortical lesion improved with decreased enhancement and decreased size currently measuring 13 mm in diameter, 14 mm previously. Dural based enhancing lesion right frontal lobe unchanged measuring 12 mm compatible with meningioma. Ventricle size normal.  No midline shift. Vascular: Normal arterial flow voids Skull and upper cervical spine: No skeletal lesions identified. Sinuses/Orbits: Negative Other: None IMPRESSION: Four metastatic to lesions the brain are all improved from the prior study. Unfortunately, there is significant motion on the postcontrast images Right frontal meningioma unchanged Electronically Signed   By:  CFranchot GalloM.D.   On: 09/25/2017 15:17   Ct Abdomen Pelvis W Contrast  Result Date: 09/25/2017 CLINICAL DATA:  Breast cancer. EXAM: CT CHEST, ABDOMEN, AND PELVIS WITH CONTRAST TECHNIQUE: Multidetector CT imaging of the chest, abdomen and pelvis was performed following the standard protocol during bolus administration of intravenous contrast. CONTRAST:  1062mISOVUE-300 IOPAMIDOL (ISOVUE-300) INJECTION 61% COMPARISON:  07/09/2017. FINDINGS: CT CHEST FINDINGS Cardiovascular: Right IJ Port-A-Cath terminates in the right atrium. Heart is mildly enlarged. No pericardial effusion. Mediastinum/Nodes: No pathologically enlarged mediastinal, hilar, internal mammary or axillary lymph nodes. Largest left axillary lymph node measures 9 mm, stable. Esophagus is grossly unremarkable. Lungs/Pleura: Biapical pleuroparenchymal scarring. Mild scattered peribronchovascular nodularity, likely postinfectious in etiology. 4 mm nodule in the right middle lobe, previously 7 mm. Small area cystic scarring in the lingula, corresponding to consolidation on the prior study. Lungs are otherwise clear. No pleural fluid. Airway is unremarkable. Musculoskeletal: No worrisome lytic or sclerotic lesions. CT ABDOMEN PELVIS FINDINGS Hepatobiliary: Subcentimeter low-attenuation lesion in the left hepatic lobe, unchanged. Liver and gallbladder are otherwise unremarkable. No biliary ductal dilatation. Pancreas: Negative. Spleen: Negative. Adrenals/Urinary Tract: Adrenal glands are unremarkable. Small stones are seen in the kidneys, left greater than right. Subcentimeter low-attenuation lesions in the kidney left kidney are too small to characterize  but statistically, cysts are most likely. Ureters are decompressed. Bladder is grossly unremarkable. Stomach/Bowel: Stomach, small bowel, appendix and colon are unremarkable. Vascular/Lymphatic: Vascular structures are unremarkable. No pathologically enlarged lymph nodes. Reproductive: Hysterectomy.  1.8 cm low-attenuation lesion in the left adnexa, likely associated with the left ovary, as before. Other: No free fluid.  Mesenteries and peritoneum are unremarkable. Musculoskeletal: Degenerative changes in the spine. No worrisome lytic or sclerotic lesions. IMPRESSION: 1. No evidence of metastatic disease. 2. Resolved lingular consolidation with residual cystic scarring. 3. Bilateral renal stones. 4. Low-attenuation left ovarian lesion, stable. Consider baseline pelvic ultrasound, as clinically indicated. Electronically Signed   By: Lorin Picket M.D.   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    PLAN:    1. 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  Brain MRI reveals continued improvement of patient's for known metastatic deposits.  Patient's CA-27.29 is now within normal limits.  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  MUGA scan on July 30, 2017 reported EF of 54%.  Proceed with maintenance Herceptin every 3 weeks for at least 1 year.  Patient will require MUGA scan prior to her next infusion.  Return to clinic in 3 weeks for continuation of Herceptin. 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 2. Brain metastasis: Patient has completed XRT.  MRI from September 25, 2017 reviewed independently  report as above with continued improvement of known metastatic lesions.  No further imaging is necessary unless there is suspicion of recurrence.   3. 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 Continue follow-up with cardiology as indicated. 4.  Erythema/rash: Unclear etiology, possibly related to XRT. Isolated to her scalp and improving. Continue topical treatment.  Increase premedication of dexamethasone to 20 mg IV with each treatment.  5.  Cough/congestion: Resolved.  Patient expressed understanding and was in agreement with this plan. She also understands that She can call clinic at any time with any questions, concerns, or complaints.    Breast cancer   Staging form: Breast, AJCC 7th Edition     Clinical stage from 02/14/2015: Stage IA (T1c, N0, M0) - Signed by Lloyd Huger, MD on 02/14/2015   Lloyd Huger, MD   10/11/2017 12:53 PM

## 2017-10-09 ENCOUNTER — Inpatient Hospital Stay: Payer: 59

## 2017-10-09 ENCOUNTER — Inpatient Hospital Stay (HOSPITAL_BASED_OUTPATIENT_CLINIC_OR_DEPARTMENT_OTHER): Payer: 59 | Admitting: Oncology

## 2017-10-09 ENCOUNTER — Encounter: Payer: Self-pay | Admitting: Oncology

## 2017-10-09 ENCOUNTER — Other Ambulatory Visit: Payer: Self-pay

## 2017-10-09 VITALS — BP 94/67 | HR 76 | Temp 98.9°F | Resp 12 | Ht 63.0 in | Wt 194.2 lb

## 2017-10-09 DIAGNOSIS — C7931 Secondary malignant neoplasm of brain: Secondary | ICD-10-CM | POA: Diagnosis not present

## 2017-10-09 DIAGNOSIS — Z5112 Encounter for antineoplastic immunotherapy: Secondary | ICD-10-CM | POA: Diagnosis not present

## 2017-10-09 DIAGNOSIS — C50411 Malignant neoplasm of upper-outer quadrant of right female breast: Secondary | ICD-10-CM

## 2017-10-09 DIAGNOSIS — R21 Rash and other nonspecific skin eruption: Secondary | ICD-10-CM | POA: Diagnosis not present

## 2017-10-09 MED ORDER — SODIUM CHLORIDE 0.9% FLUSH
10.0000 mL | INTRAVENOUS | Status: DC | PRN
  Administered 2017-10-09: 10 mL
  Filled 2017-10-09: qty 10

## 2017-10-09 MED ORDER — DIPHENHYDRAMINE HCL 25 MG PO CAPS
25.0000 mg | ORAL_CAPSULE | Freq: Once | ORAL | Status: AC
  Administered 2017-10-09: 25 mg via ORAL
  Filled 2017-10-09: qty 1

## 2017-10-09 MED ORDER — ACETAMINOPHEN 325 MG PO TABS
650.0000 mg | ORAL_TABLET | Freq: Once | ORAL | Status: AC
  Administered 2017-10-09: 650 mg via ORAL
  Filled 2017-10-09: qty 2

## 2017-10-09 MED ORDER — HEPARIN SOD (PORK) LOCK FLUSH 100 UNIT/ML IV SOLN
500.0000 [IU] | Freq: Once | INTRAVENOUS | Status: AC | PRN
  Administered 2017-10-09: 500 [IU]
  Filled 2017-10-09: qty 5

## 2017-10-09 MED ORDER — TRASTUZUMAB CHEMO 150 MG IV SOLR
6.0000 mg/kg | Freq: Once | INTRAVENOUS | Status: AC
  Administered 2017-10-09: 525 mg via INTRAVENOUS
  Filled 2017-10-09: qty 25

## 2017-10-09 MED ORDER — SODIUM CHLORIDE 0.9 % IV SOLN
Freq: Once | INTRAVENOUS | Status: AC
  Administered 2017-10-09: 10:00:00 via INTRAVENOUS
  Filled 2017-10-09: qty 1000

## 2017-10-16 MED FILL — Trastuzumab For IV Soln 150 MG: INTRAVENOUS | Qty: 5.29 | Status: AC

## 2017-10-16 MED FILL — Trastuzumab For IV Soln 150 MG: INTRAVENOUS | Qty: 5.3 | Status: AC

## 2017-10-24 ENCOUNTER — Other Ambulatory Visit: Payer: Self-pay | Admitting: *Deleted

## 2017-10-24 ENCOUNTER — Other Ambulatory Visit: Payer: Self-pay | Admitting: Oncology

## 2017-10-24 DIAGNOSIS — M8580 Other specified disorders of bone density and structure, unspecified site: Secondary | ICD-10-CM

## 2017-10-24 DIAGNOSIS — C50919 Malignant neoplasm of unspecified site of unspecified female breast: Secondary | ICD-10-CM

## 2017-10-27 NOTE — Progress Notes (Deleted)
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   INTERVAL HISTORY: Patient returns to clinic today for further evaluation, discussion of her imaging results, and initiation of maintenance Herceptin.  She currently feels well and is asymptomatic. She has no neurologic complaints. She denies any recent fevers or illnesses. She has a good appetite and has maintained her weight.  She has no chest pain, cough, or shortness of breath.  She has no nausea, vomiting, constipation, or diarrhea.  She has no urinary complaints. Patient offers no specific complaints today.  REVIEW OF SYSTEMS:   Review of Systems  Constitutional: Negative.  Negative for fever, malaise/fatigue and weight loss.  HENT: Negative.  Negative for congestion.   Respiratory: Negative.  Negative for cough and shortness of breath.   Cardiovascular: Negative.  Negative for chest pain and leg swelling.  Gastrointestinal: Negative.  Negative for abdominal pain.  Genitourinary: Negative.   Musculoskeletal: Negative.   Skin: Negative.  Negative for itching and rash.  Neurological: Negative.  Negative for sensory change and weakness.  Psychiatric/Behavioral: The patient is nervous/anxious.    As per HPI. Otherwise, a complete review of systems is negative.   PAST MEDICAL HISTORY:  Hypothyroidism, migraines, depression, asthma.  PAST SURGICAL HISTORY: Bilateral mastectomy with reconstruction, partial hysterectomy.  FAMILY HISTORY: Lung cancer, melanoma, stomach cancer.  Also diabetes, CAD,  hypertension     ADVANCED DIRECTIVES:    HEALTH MAINTENANCE: Social History   Tobacco Use  . Smoking status: Never Smoker  . Smokeless tobacco: Never Used  Substance Use Topics  . Alcohol use: No  . Drug use: No     Colonoscopy:  PAP:  Bone density:  Lipid panel:  Allergies  Allergen Reactions  . Codeine Other (See Comments)    constipation  . Sulfa Antibiotics Other (See Comments)    Allergy as a child    Current Outpatient Medications  Medication Sig Dispense Refill  . alendronate (FOSAMAX) 70 MG tablet TAKE 1 TABLET BY MOUTH  EVERY WEEK 12 tablet 0  . ALPRAZolam (XANAX) 0.25 MG tablet Take 1 tablet by mouth 3 (three) times daily as needed for anxiety.     Marland Kitchen azelastine (ASTELIN) 0.1 % nasal spray Place 1 spray into both nostrils daily as needed for rhinitis. Use in each nostril as directed    . calcium-vitamin D (OSCAL WITH D) 250-125 MG-UNIT tablet Take 1 tablet by mouth daily.    . Cholecalciferol (D3 HIGH POTENCY) 2000 units CAPS Take 1 capsule by mouth daily.    . cyclobenzaprine (FLEXERIL) 10 MG tablet Take 10 mg by mouth 3 (three) times daily as needed.     . DULoxetine (CYMBALTA) 30 MG capsule Take 90 mg by mouth every morning.     . fluticasone (FLOVENT HFA) 110 MCG/ACT inhaler Inhale 1 puff into the lungs 2 (two) times daily.    Marland Kitchen levothyroxine (SYNTHROID, LEVOTHROID) 137 MCG tablet Take 137 mcg by mouth daily before breakfast.     . lidocaine-prilocaine (EMLA) cream Apply to affected area once 30 g 3  . metoprolol succinate (TOPROL-XL) 25 MG 24 hr tablet Take  75 mg by mouth at bedtime.     . Multiple Vitamin (MULTIVITAMIN) tablet Take 1 tablet by mouth daily.    . ondansetron (ZOFRAN) 8 MG tablet Take 1 tablet (8 mg total) by mouth 2 (two) times daily as needed (Nausea or vomiting). 60 tablet 3  . phenazopyridine (PYRIDIUM) 100 MG tablet Take 1 tablet (100 mg total) by mouth 3 (three) times daily as needed for pain. 10 tablet 0  . prochlorperazine (COMPAZINE)  10 MG tablet Take 1 tablet (10 mg total) by mouth every 6 (six) hours as needed (Nausea or vomiting). 60 tablet 3  . topiramate (TOPAMAX) 50 MG tablet Take 50 mg by mouth at bedtime.     . traMADol (ULTRAM) 50 MG tablet Take 25-50 mg by mouth every 6 (six) hours as needed.     . Triamcinolone Acetonide (TRIAMCINOLONE 0.1 % CREAM : EUCERIN) CREA Apply 1 application topically 2 (two) times daily as needed. 1 each 0   No current facility-administered medications for this visit.     OBJECTIVE: There were no vitals filed for this visit.   There is no height or weight on file to calculate BMI.    ECOG FS:0 - Asymptomatic  General: Well-developed, well-nourished, no acute distress. Eyes: anicteric sclera. Breasts: Patient has bilateral mastectomies with reconstruction, no obvious evidence of local recurrence. Lungs: Clear to auscultation bilaterally. Port in place in the right chest wall. Heart: Regular rate and rhythm. No rubs, murmurs, or gallops. Abdomen: Soft, nontender, nondistended. No organomegaly noted, normoactive bowel sounds. Musculoskeletal: No edema, cyanosis, or clubbing. Neuro: Alert, answering all questions appropriately. Cranial nerves grossly intact. Skin: No rashes or petechiae noted. Psych: Normal affect.   LAB RESULTS:  Lab Results  Component Value Date   NA 136 07/23/2017   K 3.6 07/23/2017   CL 107 07/23/2017   CO2 19 (L) 07/23/2017   GLUCOSE 117 (H) 07/23/2017   BUN 13 07/23/2017   CREATININE 0.60 09/25/2017   CALCIUM 9.6 07/23/2017   PROT 7.4 07/23/2017   ALBUMIN 4.0 07/23/2017   AST 26 07/23/2017   ALT 18 07/23/2017   ALKPHOS 68 07/23/2017   BILITOT 0.6 07/23/2017   GFRNONAA >60 07/23/2017   GFRAA >60 07/23/2017    Lab Results  Component Value Date   WBC 3.8 07/23/2017   NEUTROABS 2.8 07/23/2017   HGB 12.4 07/23/2017   HCT 36.3 07/23/2017   MCV 96.7 07/23/2017   PLT 331 07/23/2017     STUDIES: No results found.  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    PLAN:    1. 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  Brain MRI reveals continued improvement of patient's for known metastatic deposits.  Patient's CA-27.29 is now within normal limits.  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  MUGA scan on July 30, 2017 reported EF of 54%.  Proceed with maintenance Herceptin every 3 weeks for at   least 1 year.  Patient will require MUGA scan prior to her next infusion.  Return to clinic in 3 weeks for continuation of Herceptin. Of note, patient gets all of her laboratory work at [facility]. 2. Brain metastasis: Patient has completed XRT.  MRI from September 25, 2017 reviewed independently report as above with continued improvement of known metastatic lesions.  No further imaging is necessary unless there is suspicion of recurrence.   3. 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 Continue follow-up with cardiology as indicated. 4.  Erythema/rash: Unclear etiology, possibly related to XRT. Isolated to her scalp and improving. Continue topical treatment.  Increase premedication of dexamethasone to 20 mg IV with each treatment.  5.  Cough/congestion: Resolved.  Patient expressed understanding and was in agreement with this plan. She also understands that She can call clinic at any time  with any questions, concerns, or complaints.    Breast cancer   Staging form: Breast, AJCC 7th Edition     Clinical stage from 02/14/2015: Stage IA (T1c, N0, M0) - Signed by  J , MD on 02/14/2015    J , MD   10/27/2017 10:08 AM     

## 2017-10-28 ENCOUNTER — Other Ambulatory Visit: Payer: Self-pay | Admitting: Oncology

## 2017-10-28 ENCOUNTER — Telehealth: Payer: Self-pay | Admitting: *Deleted

## 2017-10-28 ENCOUNTER — Encounter
Admission: RE | Admit: 2017-10-28 | Discharge: 2017-10-28 | Disposition: A | Discharge status: Home / Self Care | Payer: 59 | Source: Ambulatory Visit | Attending: Oncology | Admitting: Oncology

## 2017-10-28 DIAGNOSIS — E7849 Other hyperlipidemia: Secondary | ICD-10-CM | POA: Diagnosis not present

## 2017-10-28 DIAGNOSIS — I3139 Other pericardial effusion (noninflammatory): Secondary | ICD-10-CM

## 2017-10-28 DIAGNOSIS — I471 Supraventricular tachycardia: Secondary | ICD-10-CM | POA: Diagnosis not present

## 2017-10-28 DIAGNOSIS — C50411 Malignant neoplasm of upper-outer quadrant of right female breast: Secondary | ICD-10-CM | POA: Insufficient documentation

## 2017-10-28 DIAGNOSIS — M5136 Other intervertebral disc degeneration, lumbar region: Secondary | ICD-10-CM | POA: Diagnosis not present

## 2017-10-28 DIAGNOSIS — I313 Pericardial effusion (noninflammatory): Secondary | ICD-10-CM

## 2017-10-28 NOTE — Telephone Encounter (Signed)
i'm ok with the echo instead.  Thanks.

## 2017-10-28 NOTE — Telephone Encounter (Addendum)
Unable to gain IV access for patient to get her NM scan today. IV team came and got IV, but there was no blood return and so they did not inject. Patient has been there for 2.5 hours and they are going to reschedule her for a time when her port can be accessed for procedure to be done unless Dr Grayland Ormond wants to order an ECHO instead of a MUGA scan. They need 3 day notice if not going to approve MUGA scan. Please advise if you want MUGA or ECHO

## 2017-10-28 NOTE — Telephone Encounter (Signed)
Order entered

## 2017-10-29 DIAGNOSIS — H9190 Unspecified hearing loss, unspecified ear: Secondary | ICD-10-CM | POA: Diagnosis not present

## 2017-10-29 DIAGNOSIS — H93299 Other abnormal auditory perceptions, unspecified ear: Secondary | ICD-10-CM | POA: Diagnosis not present

## 2017-10-30 ENCOUNTER — Ambulatory Visit
Admission: RE | Admit: 2017-10-30 | Discharge: 2017-10-30 | Disposition: A | Discharge status: Home / Self Care | Payer: 59 | Source: Ambulatory Visit | Attending: Oncology | Admitting: Oncology

## 2017-10-30 ENCOUNTER — Ambulatory Visit: Payer: 59 | Admitting: Oncology

## 2017-10-30 ENCOUNTER — Ambulatory Visit: Payer: 59

## 2017-10-30 DIAGNOSIS — I313 Pericardial effusion (noninflammatory): Secondary | ICD-10-CM | POA: Diagnosis not present

## 2017-10-30 DIAGNOSIS — I420 Dilated cardiomyopathy: Secondary | ICD-10-CM | POA: Insufficient documentation

## 2017-10-30 DIAGNOSIS — I3139 Other pericardial effusion (noninflammatory): Secondary | ICD-10-CM

## 2017-10-30 MED ORDER — PERFLUTREN LIPID MICROSPHERE
1.0000 mL | INTRAVENOUS | Status: AC | PRN
  Administered 2017-10-30: 2 mL via INTRAVENOUS
  Filled 2017-10-30: qty 10

## 2017-10-30 NOTE — Progress Notes (Signed)
*  PRELIMINARY RESULTS* Echocardiogram 2D Echocardiogram has been performed.  Julie Irwin 10/30/2017, 12:02 PM

## 2017-10-31 ENCOUNTER — Inpatient Hospital Stay: Payer: 59 | Attending: Oncology | Admitting: Oncology

## 2017-10-31 ENCOUNTER — Other Ambulatory Visit: Payer: Self-pay

## 2017-10-31 ENCOUNTER — Inpatient Hospital Stay: Payer: 59

## 2017-10-31 ENCOUNTER — Encounter: Payer: Self-pay | Admitting: Oncology

## 2017-10-31 VITALS — BP 105/73 | HR 78 | Temp 98.6°F | Wt 189.1 lb

## 2017-10-31 DIAGNOSIS — C50411 Malignant neoplasm of upper-outer quadrant of right female breast: Secondary | ICD-10-CM | POA: Diagnosis not present

## 2017-10-31 DIAGNOSIS — Z5112 Encounter for antineoplastic immunotherapy: Secondary | ICD-10-CM | POA: Insufficient documentation

## 2017-10-31 DIAGNOSIS — I313 Pericardial effusion (noninflammatory): Secondary | ICD-10-CM

## 2017-10-31 DIAGNOSIS — R21 Rash and other nonspecific skin eruption: Secondary | ICD-10-CM

## 2017-10-31 DIAGNOSIS — C7931 Secondary malignant neoplasm of brain: Secondary | ICD-10-CM | POA: Diagnosis not present

## 2017-10-31 DIAGNOSIS — Z171 Estrogen receptor negative status [ER-]: Secondary | ICD-10-CM

## 2017-10-31 DIAGNOSIS — R05 Cough: Secondary | ICD-10-CM | POA: Diagnosis not present

## 2017-10-31 DIAGNOSIS — Z79899 Other long term (current) drug therapy: Secondary | ICD-10-CM | POA: Insufficient documentation

## 2017-10-31 MED ORDER — SODIUM CHLORIDE 0.9 % IV SOLN
Freq: Once | INTRAVENOUS | Status: AC
  Administered 2017-10-31: 10:00:00 via INTRAVENOUS
  Filled 2017-10-31: qty 1000

## 2017-10-31 MED ORDER — TRASTUZUMAB CHEMO 150 MG IV SOLR
6.0000 mg/kg | Freq: Once | INTRAVENOUS | Status: AC
  Administered 2017-10-31: 525 mg via INTRAVENOUS
  Filled 2017-10-31: qty 25

## 2017-10-31 MED ORDER — ACETAMINOPHEN 325 MG PO TABS
650.0000 mg | ORAL_TABLET | Freq: Once | ORAL | Status: AC
  Administered 2017-10-31: 650 mg via ORAL
  Filled 2017-10-31: qty 2

## 2017-10-31 MED ORDER — DIPHENHYDRAMINE HCL 25 MG PO CAPS
25.0000 mg | ORAL_CAPSULE | Freq: Once | ORAL | Status: AC
  Administered 2017-10-31: 25 mg via ORAL
  Filled 2017-10-31: qty 1

## 2017-10-31 MED ORDER — HEPARIN SOD (PORK) LOCK FLUSH 100 UNIT/ML IV SOLN
500.0000 [IU] | Freq: Once | INTRAVENOUS | Status: AC | PRN
  Administered 2017-10-31: 500 [IU]
  Filled 2017-10-31: qty 5

## 2017-10-31 NOTE — Progress Notes (Signed)
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   INTERVAL HISTORY: Patient returns to clinic today for further evaluation and continuation of maintenance Herceptin.  She has some increased congestion which seems to be causing some hearing loss in her right ear.  She otherwise feels well and is asymptomatic. She has no neurologic complaints. She denies any recent fevers or illnesses. She has a good appetite and has maintained her weight.  She has no chest pain, cough, or shortness of breath.  She has no nausea, vomiting, constipation, or diarrhea.  She has no urinary complaints. Patient offers no further specific complaints today.  REVIEW OF SYSTEMS:   Review of Systems  Constitutional: Negative.  Negative for fever, malaise/fatigue and weight loss.  HENT: Positive for congestion and hearing loss.   Respiratory: Negative.  Negative for cough and shortness of breath.   Cardiovascular: Negative.  Negative for chest pain and leg swelling.  Gastrointestinal: Negative.  Negative for abdominal pain.  Genitourinary: Negative.   Musculoskeletal: Negative.   Skin: Negative.  Negative for itching and rash.  Neurological: Negative.  Negative for sensory change and weakness.  Psychiatric/Behavioral: Negative.  The patient is not nervous/anxious.    As per HPI. Otherwise, a complete review of systems is negative.   PAST MEDICAL HISTORY:  Hypothyroidism, migraines, depression, asthma.  PAST SURGICAL HISTORY: Bilateral mastectomy with reconstruction, partial  hysterectomy.  FAMILY HISTORY: Lung cancer, melanoma, stomach cancer.  Also diabetes, CAD, hypertension     ADVANCED DIRECTIVES:    HEALTH MAINTENANCE: Social History   Tobacco Use  . Smoking status: Never Smoker  . Smokeless tobacco: Never Used  Substance Use Topics  . Alcohol use: No  . Drug use: No     Colonoscopy:  PAP:  Bone density:  Lipid panel:  Allergies  Allergen Reactions  . Codeine Other (See Comments)    constipation  . Sulfa Antibiotics Other (See Comments)    Allergy as a child    Current Outpatient Medications  Medication Sig Dispense Refill  . alendronate (FOSAMAX) 70 MG tablet TAKE 1 TABLET BY MOUTH  EVERY WEEK 12 tablet 0  . ALPRAZolam (XANAX) 0.25 MG tablet Take 1 tablet by mouth 3 (three) times daily as needed for anxiety.     Marland Kitchen azelastine (ASTELIN) 0.1 % nasal spray Place 1 spray into both nostrils daily as needed for rhinitis. Use in each nostril as directed    . calcium-vitamin D (OSCAL WITH D) 250-125 MG-UNIT tablet Take 1 tablet by mouth daily.    . Cholecalciferol (D3 HIGH POTENCY) 2000 units CAPS Take 1 capsule by mouth daily.    . cyclobenzaprine (FLEXERIL) 10 MG tablet Take 10 mg by mouth 3 (three) times daily as needed.     . DULoxetine (CYMBALTA) 30 MG capsule Take 90 mg by mouth every morning.     . fluticasone (FLOVENT HFA) 110 MCG/ACT inhaler Inhale 1 puff into the lungs 2 (two) times daily.    Marland Kitchen levothyroxine (SYNTHROID, LEVOTHROID) 137 MCG tablet Take 137 mcg by mouth daily before breakfast.     . lidocaine-prilocaine (EMLA) cream Apply  to affected area once 30 g 3  . metoprolol succinate (TOPROL-XL) 25 MG 24 hr tablet Take 75 mg by mouth at bedtime.     . Multiple Vitamin (MULTIVITAMIN) tablet Take 1 tablet by mouth daily.    . ondansetron (ZOFRAN) 8 MG tablet Take 1 tablet (8 mg total) by mouth 2 (two) times daily as needed (Nausea or vomiting). 60 tablet 3  . phenazopyridine (PYRIDIUM) 100 MG tablet Take 1 tablet (100 mg total) by  mouth 3 (three) times daily as needed for pain. 10 tablet 0  . prochlorperazine (COMPAZINE) 10 MG tablet Take 1 tablet (10 mg total) by mouth every 6 (six) hours as needed (Nausea or vomiting). 60 tablet 3  . topiramate (TOPAMAX) 50 MG tablet Take 50 mg by mouth at bedtime.     . traMADol (ULTRAM) 50 MG tablet Take 25-50 mg by mouth every 6 (six) hours as needed.     . Triamcinolone Acetonide (TRIAMCINOLONE 0.1 % CREAM : EUCERIN) CREA Apply 1 application topically 2 (two) times daily as needed. 1 each 0   No current facility-administered medications for this visit.     OBJECTIVE: Vitals:   10/31/17 0852  BP: 105/73  Pulse: 78  Temp: 98.6 F (37 C)     Body mass index is 33.5 kg/m.    ECOG FS:0 - Asymptomatic  General: Well-developed, well-nourished, no acute distress. Eyes: anicteric sclera. Breasts: Patient has bilateral mastectomies with reconstruction, no obvious evidence of local recurrence. Lungs: Clear to auscultation bilaterally. Port in place in the right chest wall. Heart: Regular rate and rhythm. No rubs, murmurs, or gallops. Abdomen: Soft, nontender, nondistended. No organomegaly noted, normoactive bowel sounds. Musculoskeletal: No edema, cyanosis, or clubbing. Neuro: Alert, answering all questions appropriately. Cranial nerves grossly intact. Skin: No rashes or petechiae noted. Psych: Normal affect.   LAB RESULTS:  Lab Results  Component Value Date   NA 136 07/23/2017   K 3.6 07/23/2017   CL 107 07/23/2017   CO2 19 (L) 07/23/2017   GLUCOSE 117 (H) 07/23/2017   BUN 13 07/23/2017   CREATININE 0.60 09/25/2017   CALCIUM 9.6 07/23/2017   PROT 7.4 07/23/2017   ALBUMIN 4.0 07/23/2017   AST 26 07/23/2017   ALT 18 07/23/2017   ALKPHOS 68 07/23/2017   BILITOT 0.6 07/23/2017   GFRNONAA >60 07/23/2017   GFRAA >60 07/23/2017    Lab Results  Component Value Date   WBC 3.8 07/23/2017   NEUTROABS 2.8 07/23/2017   HGB 12.4 07/23/2017   HCT 36.3 07/23/2017   MCV  96.7 07/23/2017   PLT 331 07/23/2017     STUDIES: No results found.  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    PLAN:    1. 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  Brain MRI reveals continued improvement of patient's for known metastatic deposits.  Patient's CA-27.29 is now within normal limits.  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  Cardiac echo completed on October 30, 2017 revealed an estimated EF of 50-55%.  There is no pericardial effusion.  Repeat cardiac echo in May 2019.  Proceed with cycle 8 of 18 of maintenance Herceptin today. Return to clinic in 3 weeks for continuation of Herceptin. 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 2. Brain metastasis: Patient has completed XRT.  MRI from September 25, 2017 reviewed independently report as above with continued improvement of known metastatic lesions.  No further imaging is necessary unless there is suspicion of recurrence.   3. Malignant pericardial fluid: Cardiac echo as above. Continue follow-up with cardiology as indicated. 4.  Erythema/rash: Unclear etiology, possibly related to XRT. Isolated to her scalp and improving. Continue topical treatment.  Increase premedication of dexamethasone to 20 mg IV with each treatment.  5.  Cough/congestion: Recommended  OTC remedies.  Patient expressed understanding and was in agreement with this plan. She also understands that She can call clinic at any time with any questions, concerns, or complaints.    Breast cancer   Staging form: Breast, AJCC 7th Edition     Clinical stage from 02/14/2015: Stage IA (T1c, N0, M0) - Signed by Lloyd Huger, MD on 02/14/2015   Lloyd Huger, MD   10/31/2017 9:15 AM

## 2017-11-04 DIAGNOSIS — H6981 Other specified disorders of Eustachian tube, right ear: Secondary | ICD-10-CM | POA: Diagnosis not present

## 2017-11-04 DIAGNOSIS — J301 Allergic rhinitis due to pollen: Secondary | ICD-10-CM | POA: Diagnosis not present

## 2017-11-04 DIAGNOSIS — H6123 Impacted cerumen, bilateral: Secondary | ICD-10-CM | POA: Diagnosis not present

## 2017-11-15 DIAGNOSIS — H6122 Impacted cerumen, left ear: Secondary | ICD-10-CM | POA: Diagnosis not present

## 2017-11-15 DIAGNOSIS — H6981 Other specified disorders of Eustachian tube, right ear: Secondary | ICD-10-CM | POA: Diagnosis not present

## 2017-11-17 NOTE — Progress Notes (Signed)
 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   INTERVAL HISTORY: Patient returns to clinic today for further evaluation and continuation of maintenance Herceptin.  She recently had vomiting and diarrhea for approximately 24 hours, but this is since resolved.  She has increased weakness and fatigue from this, but otherwise feels well and nearly back to her baseline. She has no neurologic complaints. She denies any fevers.  She has a fair appetite and has maintained her weight.  She has no chest pain, cough, or shortness of breath. She has no urinary complaints. Patient offers no further specific complaints today.  REVIEW OF SYSTEMS:   Review of Systems  Constitutional: Positive for malaise/fatigue. Negative for fever and weight loss.  HENT: Negative.  Negative for congestion and hearing loss.   Respiratory: Negative.  Negative for cough and shortness of breath.   Cardiovascular: Negative.  Negative for chest pain and leg swelling.  Gastrointestinal: Positive for diarrhea and vomiting. Negative for abdominal pain.  Genitourinary: Negative.  Negative for dysuria.  Musculoskeletal: Negative.  Negative for back pain.  Skin: Negative.  Negative for itching and rash.  Neurological: Negative.  Negative for sensory change, focal weakness and weakness.  Psychiatric/Behavioral: Negative.  The patient is not nervous/anxious and does not have insomnia.    As per HPI. Otherwise, a complete review of systems is negative.   PAST MEDICAL HISTORY:   Hypothyroidism, migraines, depression, asthma.  PAST SURGICAL HISTORY: Bilateral mastectomy with reconstruction, partial hysterectomy.  FAMILY HISTORY: Lung cancer, melanoma, stomach cancer.  Also diabetes, CAD, hypertension     ADVANCED DIRECTIVES:    HEALTH MAINTENANCE: Social History   Tobacco Use  . Smoking status: Never Smoker  . Smokeless tobacco: Never Used  Substance Use Topics  . Alcohol use: No  . Drug use: No     Colonoscopy:  PAP:  Bone density:  Lipid panel:  Allergies  Allergen Reactions  . Codeine Other (See Comments)    constipation  . Sulfa Antibiotics Other (See Comments)    Allergy as a child    Current Outpatient Medications  Medication Sig Dispense Refill  . alendronate (FOSAMAX) 70 MG tablet TAKE 1 TABLET BY MOUTH  EVERY WEEK 12 tablet 0  . azelastine (ASTELIN) 0.1 % nasal spray Place 1 spray into both nostrils daily as needed for rhinitis. Use in each nostril as directed    . calcium-vitamin D (OSCAL WITH D) 250-125 MG-UNIT tablet Take 1 tablet by mouth daily.    . Cholecalciferol (D3 HIGH POTENCY) 2000 units CAPS Take 1 capsule by mouth daily.    . DULoxetine (CYMBALTA) 30 MG capsule Take 90 mg by mouth every morning.     . fluticasone (FLONASE) 50 MCG/ACT nasal spray SPRAY TWICE IEN ONCE D  12  . levothyroxine (SYNTHROID, LEVOTHROID) 137 MCG tablet Take 137 mcg by mouth daily before breakfast.     . lidocaine-prilocaine (EMLA) cream Apply to affected area once 30 g 3  . metoprolol succinate (TOPROL-XL) 25 MG 24 hr tablet Take 75 mg by mouth at bedtime.     . Multiple Vitamin (MULTIVITAMIN) tablet   Take 1 tablet by mouth daily.    . ondansetron (ZOFRAN) 8 MG tablet Take 1 tablet (8 mg total) by mouth 2 (two) times daily as needed (Nausea or vomiting). 60 tablet 3  . prochlorperazine (COMPAZINE) 10 MG tablet Take 1 tablet (10 mg total) by mouth every 6 (six) hours as needed (Nausea or vomiting). 60 tablet 3  . topiramate (TOPAMAX) 50 MG tablet  Take 50 mg by mouth at bedtime.     . ALPRAZolam (XANAX) 0.25 MG tablet Take 1 tablet by mouth 3 (three) times daily as needed for anxiety.     . cyclobenzaprine (FLEXERIL) 10 MG tablet Take 10 mg by mouth 3 (three) times daily as needed.     . fluticasone (FLOVENT HFA) 110 MCG/ACT inhaler Inhale 1 puff into the lungs 2 (two) times daily.    . phenazopyridine (PYRIDIUM) 100 MG tablet Take 1 tablet (100 mg total) by mouth 3 (three) times daily as needed for pain. 10 tablet 0  . traMADol (ULTRAM) 50 MG tablet Take 25-50 mg by mouth every 6 (six) hours as needed.     . Triamcinolone Acetonide (TRIAMCINOLONE 0.1 % CREAM : EUCERIN) CREA Apply 1 application topically 2 (two) times daily as needed. (Patient not taking: Reported on 11/20/2017) 1 each 0   No current facility-administered medications for this visit.     OBJECTIVE: Vitals:   11/20/17 0947  BP: 106/66  Pulse: 77  Resp: 18  Temp: (!) 97.4 F (36.3 C)     Body mass index is 32.2 kg/m.    ECOG FS:0 - Asymptomatic  General: Well-developed, well-nourished, no acute distress. Eyes: Pink conjunctiva, anicteric sclera. Breasts: Patient has bilateral mastectomies with reconstruction, no obvious evidence of local recurrence. Lungs: Clear to auscultation bilaterally. Port in place in the right chest wall. Heart: Regular rate and rhythm. No rubs, murmurs, or gallops. Abdomen: Soft, nontender, nondistended. No organomegaly noted, normoactive bowel sounds. Musculoskeletal: No edema, cyanosis, or clubbing. Neuro: Alert, answering all questions appropriately. Cranial nerves grossly intact. Skin: No rashes or petechiae noted.  Rash on scalp has resolved. Psych: Normal affect.    LAB RESULTS:  Lab Results  Component Value Date   NA 136 07/23/2017   K 3.6 07/23/2017   CL 107 07/23/2017   CO2 19 (L) 07/23/2017   GLUCOSE 117 (H) 07/23/2017   BUN 13 07/23/2017   CREATININE 0.60 09/25/2017   CALCIUM 9.6 07/23/2017   PROT 7.4 07/23/2017    ALBUMIN 4.0 07/23/2017   AST 26 07/23/2017   ALT 18 07/23/2017   ALKPHOS 68 07/23/2017   BILITOT 0.6 07/23/2017   GFRNONAA >60 07/23/2017   GFRAA >60 07/23/2017    Lab Results  Component Value Date   WBC 3.8 07/23/2017   NEUTROABS 2.8 07/23/2017   HGB 12.4 07/23/2017   HCT 36.3 07/23/2017   MCV 96.7 07/23/2017   PLT 331 07/23/2017     STUDIES: No results found.  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    PLAN:    1. 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  Brain MRI reveals continued improvement of patient's for known metastatic deposits.  Patient's CA-27.29 is now within normal limits.  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  Cardiac echo completed on October 30, 2017 revealed an estimated EF of 50-55%.  There is no pericardial effusion.  Repeat cardiac echo in May 2019.  Proceed with cycle 9 of 18 of maintenance Herceptin today. Return to clinic in 3 weeks for consideration of cycle 10. Of note, patient gets all of her laboratory work at [facility]. 2. Brain metastasis: Patient has completed XRT.  MRI from September 25, 2017 reviewed independently with continued improvement of known metastatic lesions.  No further imaging is necessary unless there is suspicion of recurrence.   3. Malignant pericardial fluid: Resolved.  Cardiac echo as above.  Continue follow-up with cardiology as indicated. 4.  Erythema/rash: Resolved. Unclear etiology, possibly related to XRT.  5.  Diarrhea/vomiting: Appears unrelated to patient's treatment.  Possibly viral GI illness.  Monitor.  Approximately 30 minutes was spent in discussion of which greater than 50% was consultation.  Patient expressed understanding and was in agreement with this plan. She also understands that She can call clinic at any time with any questions, concerns, or complaints.    Breast cancer   Staging form: Breast, AJCC 7th Edition     Clinical stage from 02/14/2015: Stage IA (T1c, N0, M0) - Signed by  J , MD on 02/14/2015    J , MD   11/22/2017 12:01 PM     

## 2017-11-18 DIAGNOSIS — C50411 Malignant neoplasm of upper-outer quadrant of right female breast: Secondary | ICD-10-CM | POA: Diagnosis not present

## 2017-11-19 ENCOUNTER — Other Ambulatory Visit: Payer: 59

## 2017-11-19 ENCOUNTER — Other Ambulatory Visit: Payer: Self-pay | Admitting: Oncology

## 2017-11-20 ENCOUNTER — Other Ambulatory Visit: Payer: Self-pay

## 2017-11-20 ENCOUNTER — Inpatient Hospital Stay: Payer: 59

## 2017-11-20 ENCOUNTER — Inpatient Hospital Stay: Payer: 59 | Attending: Oncology | Admitting: Oncology

## 2017-11-20 VITALS — BP 106/66 | HR 77 | Temp 97.4°F | Resp 18 | Wt 181.8 lb

## 2017-11-20 DIAGNOSIS — Z5112 Encounter for antineoplastic immunotherapy: Secondary | ICD-10-CM | POA: Insufficient documentation

## 2017-11-20 DIAGNOSIS — R1110 Vomiting, unspecified: Secondary | ICD-10-CM | POA: Diagnosis not present

## 2017-11-20 DIAGNOSIS — R399 Unspecified symptoms and signs involving the genitourinary system: Secondary | ICD-10-CM

## 2017-11-20 DIAGNOSIS — Z79899 Other long term (current) drug therapy: Secondary | ICD-10-CM | POA: Diagnosis not present

## 2017-11-20 DIAGNOSIS — C50411 Malignant neoplasm of upper-outer quadrant of right female breast: Secondary | ICD-10-CM

## 2017-11-20 DIAGNOSIS — R197 Diarrhea, unspecified: Secondary | ICD-10-CM

## 2017-11-20 DIAGNOSIS — C7931 Secondary malignant neoplasm of brain: Secondary | ICD-10-CM | POA: Diagnosis not present

## 2017-11-20 MED ORDER — PHENAZOPYRIDINE HCL 100 MG PO TABS: 100.0000 mg | ORAL_TABLET | Freq: Three times a day (TID) | ORAL | 0 refills | Status: DC | PRN

## 2017-11-20 MED ORDER — SODIUM CHLORIDE 0.9 % IV SOLN
6.0000 mg/kg | Freq: Once | INTRAVENOUS | Status: AC
  Administered 2017-11-20: 525 mg via INTRAVENOUS
  Filled 2017-11-20: qty 25

## 2017-11-20 MED ORDER — ACETAMINOPHEN 325 MG PO TABS
650.0000 mg | ORAL_TABLET | Freq: Once | ORAL | Status: AC
  Administered 2017-11-20: 650 mg via ORAL
  Filled 2017-11-20: qty 2

## 2017-11-20 MED ORDER — HEPARIN SOD (PORK) LOCK FLUSH 100 UNIT/ML IV SOLN
500.0000 [IU] | Freq: Once | INTRAVENOUS | Status: AC | PRN
  Administered 2017-11-20: 500 [IU]
  Filled 2017-11-20: qty 5

## 2017-11-20 MED ORDER — DIPHENHYDRAMINE HCL 25 MG PO CAPS
25.0000 mg | ORAL_CAPSULE | Freq: Once | ORAL | Status: AC
  Administered 2017-11-20: 25 mg via ORAL
  Filled 2017-11-20: qty 1

## 2017-11-20 MED ORDER — SODIUM CHLORIDE 0.9 % IV SOLN
Freq: Once | INTRAVENOUS | Status: AC
  Administered 2017-11-20: 11:00:00 via INTRAVENOUS
  Filled 2017-11-20: qty 1000

## 2017-11-20 NOTE — Progress Notes (Signed)
Per pt had vomitIng 11/19/17-all day stopped that day and had diarrhea daily x 3-7 times-improved 11/19/17 and feeling weak and drained per pt-sx improved . Today no sx.

## 2017-11-22 ENCOUNTER — Other Ambulatory Visit: Payer: Self-pay | Admitting: Oncology

## 2017-11-27 ENCOUNTER — Ambulatory Visit: Payer: 59

## 2017-11-27 ENCOUNTER — Ambulatory Visit: Payer: 59 | Admitting: Oncology

## 2017-11-27 DIAGNOSIS — I471 Supraventricular tachycardia: Secondary | ICD-10-CM | POA: Diagnosis not present

## 2017-11-27 DIAGNOSIS — E034 Atrophy of thyroid (acquired): Secondary | ICD-10-CM | POA: Diagnosis not present

## 2017-11-27 DIAGNOSIS — M5136 Other intervertebral disc degeneration, lumbar region: Secondary | ICD-10-CM | POA: Diagnosis not present

## 2017-11-27 DIAGNOSIS — N390 Urinary tract infection, site not specified: Secondary | ICD-10-CM | POA: Diagnosis not present

## 2017-12-03 ENCOUNTER — Other Ambulatory Visit: Payer: Self-pay | Admitting: Oncology

## 2017-12-06 DIAGNOSIS — H6981 Other specified disorders of Eustachian tube, right ear: Secondary | ICD-10-CM | POA: Diagnosis not present

## 2017-12-08 NOTE — Progress Notes (Signed)
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   INTERVAL HISTORY: Patient returns to clinic today for further evaluation and consideration of her next infusion of maintenance Herceptin.  She currently feels well and is asymptomatic.  She does not complain of any weakness or fatigue today.  She continues to be active and work full-time.  She has no neurologic complaints.  She denies any recent fevers or illnesses.  She has a fair appetite and has maintained her weight.  She has no chest pain, cough, or shortness of breath. She has no urinary complaints.  Patient offers no specific complaints today.  REVIEW OF SYSTEMS:   Review of Systems  Constitutional: Negative.  Negative for fever, malaise/fatigue and weight loss.  HENT: Negative.  Negative for congestion and hearing loss.   Respiratory: Negative.  Negative for cough and shortness of breath.   Cardiovascular: Negative.  Negative for chest pain and leg swelling.  Gastrointestinal: Negative.  Negative for abdominal pain, diarrhea and vomiting.  Genitourinary: Negative.  Negative for dysuria.  Musculoskeletal: Negative.  Negative for back pain.  Skin: Negative.  Negative for itching and rash.  Neurological: Negative.  Negative for sensory change, focal weakness and weakness.  Psychiatric/Behavioral: Negative.  The patient is not nervous/anxious and does not have insomnia.    As per HPI. Otherwise, a complete review of systems is negative.   PAST MEDICAL HISTORY:  Hypothyroidism, migraines,  depression, asthma.  PAST SURGICAL HISTORY: Bilateral mastectomy with reconstruction, partial hysterectomy.  FAMILY HISTORY: Lung cancer, melanoma, stomach cancer.  Also diabetes, CAD, hypertension     ADVANCED DIRECTIVES:    HEALTH MAINTENANCE: Social History   Tobacco Use  . Smoking status: Never Smoker  . Smokeless tobacco: Never Used  Substance Use Topics  . Alcohol use: No  . Drug use: No     Colonoscopy:  PAP:  Bone density:  Lipid panel:  Allergies  Allergen Reactions  . Codeine Other (See Comments)    constipation  . Sulfa Antibiotics Other (See Comments)    Allergy as a child    Current Outpatient Medications  Medication Sig Dispense Refill  . alendronate (FOSAMAX) 70 MG tablet TAKE 1 TABLET BY MOUTH  EVERY WEEK 12 tablet 0  . ALPRAZolam (XANAX) 0.25 MG tablet Take 1 tablet by mouth 3 (three) times daily as needed for anxiety.     Marland Kitchen azelastine (ASTELIN) 0.1 % nasal spray Place 1 spray into both nostrils daily as needed for rhinitis. Use in each nostril as directed    . calcium-vitamin D (OSCAL WITH D) 250-125 MG-UNIT tablet Take 1 tablet by mouth daily.    . Cholecalciferol (D3 HIGH POTENCY) 2000 units CAPS Take 1 capsule by mouth daily.    . cyclobenzaprine (FLEXERIL) 10 MG tablet Take 10 mg by mouth 3 (three) times daily as needed.     . DULoxetine (CYMBALTA) 30 MG capsule Take 90 mg by mouth every morning.     . fluticasone (FLONASE) 50 MCG/ACT nasal spray SPRAY TWICE IEN ONCE D  12  . fluticasone (FLOVENT HFA) 110 MCG/ACT inhaler Inhale 1 puff into  the lungs 2 (two) times daily.    Marland Kitchen levothyroxine (SYNTHROID, LEVOTHROID) 137 MCG tablet Take 137 mcg by mouth daily before breakfast.     . lidocaine-prilocaine (EMLA) cream Apply to affected area once 30 g 3  . metoprolol succinate (TOPROL-XL) 25 MG 24 hr tablet Take 75 mg by mouth at bedtime.     . Multiple Vitamin (MULTIVITAMIN) tablet Take 1 tablet by mouth daily.    Marland Kitchen ofloxacin (FLOXIN) 0.3 % OTIC  solution INSTILL 4 GTS INTO RIGHT EAR EVERY NIGHT AT BEDTIME FOR 4 DAYS  3  . ondansetron (ZOFRAN) 8 MG tablet Take 1 tablet (8 mg total) by mouth 2 (two) times daily as needed (Nausea or vomiting). 60 tablet 3  . phenazopyridine (PYRIDIUM) 100 MG tablet Take 1 tablet (100 mg total) by mouth 3 (three) times daily as needed for pain. 10 tablet 0  . prochlorperazine (COMPAZINE) 10 MG tablet Take 1 tablet (10 mg total) by mouth every 6 (six) hours as needed (Nausea or vomiting). 60 tablet 3  . topiramate (TOPAMAX) 50 MG tablet Take 50 mg by mouth at bedtime.     . traMADol (ULTRAM) 50 MG tablet Take 25-50 mg by mouth every 6 (six) hours as needed.     . Triamcinolone Acetonide (TRIAMCINOLONE 0.1 % CREAM : EUCERIN) CREA Apply 1 application topically 2 (two) times daily as needed. (Patient not taking: Reported on 11/20/2017) 1 each 0   No current facility-administered medications for this visit.     OBJECTIVE: Vitals:   12/11/17 0959  BP: 101/68  Pulse: 63  Resp: 18  Temp: 98.2 F (36.8 C)     Body mass index is 31.56 kg/m.    ECOG FS:0 - Asymptomatic  General: Well-developed, well-nourished, no acute distress. Eyes: Pink conjunctiva, anicteric sclera. Breasts: Patient has bilateral mastectomies with reconstruction, no obvious evidence of local recurrence. Lungs: Clear to auscultation bilaterally. Heart: Regular rate and rhythm. No rubs, murmurs, or gallops. Abdomen: Soft, nontender, nondistended. No organomegaly noted, normoactive bowel sounds. Musculoskeletal: No edema, cyanosis, or clubbing. Neuro: Alert, answering all questions appropriately. Cranial nerves grossly intact. Skin: No rashes or petechiae noted. Psych: Normal affect.   LAB RESULTS:  Lab Results  Component Value Date   NA 136 07/23/2017   K 3.6 07/23/2017   CL 107 07/23/2017   CO2 19 (L) 07/23/2017   GLUCOSE 117 (H) 07/23/2017   BUN 13 07/23/2017   CREATININE 0.60 09/25/2017   CALCIUM 9.6 07/23/2017   PROT 7.4  07/23/2017   ALBUMIN 4.0 07/23/2017   AST 26 07/23/2017   ALT 18 07/23/2017   ALKPHOS 68 07/23/2017   BILITOT 0.6 07/23/2017   GFRNONAA >60 07/23/2017   GFRAA >60 07/23/2017    Lab Results  Component Value Date   WBC 3.8 07/23/2017   NEUTROABS 2.8 07/23/2017   HGB 12.4 07/23/2017   HCT 36.3 07/23/2017   MCV 96.7 07/23/2017   PLT 331 07/23/2017     STUDIES: No results found.  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    PLAN:    1. 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  Brain MRI reveals continued improvement of patient's for known metastatic deposits.  Patient's CA-27.29 continues to be within normal limits at 18.0.  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  Cardiac echo completed on October 30, 2017 revealed an estimated EF of 50-55%.  There is no pericardial effusion.  Repeat in June 2019.  Proceed with cycle 10 18 of maintenance Herceptin today.  Return to clinic in 3 weeks for further evaluation and consideration of cycle 11.  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 2. Brain metastasis: Essentially resolved.  Patient has completed XRT.  MRI from September 25, 2017 reviewed independently with continued improvement of known metastatic lesions.  No further imaging is necessary unless there is suspicion of recurrence.   3.  Malignant pericardial fluid: Resolved.  Cardiac echo as above. Continue follow-up with cardiology as indicated. 4.  Erythema/rash: Resolved. Unclear etiology, possibly related to XRT.  5.  Diarrhea/vomiting: Resolved.  Approximately 30 minutes was spent in discussion of which greater than 50% was consultation.  Patient expressed understanding and was in agreement with this plan. She also understands that She can call clinic at any time with any questions, concerns, or complaints.    Breast cancer   Staging form: Breast, AJCC 7th Edition     Clinical stage from 02/14/2015: Stage IA (T1c, N0, M0) - Signed by Lloyd Huger, MD on 02/14/2015   Lloyd Huger, MD   12/12/2017 10:29 AM

## 2017-12-11 ENCOUNTER — Inpatient Hospital Stay: Payer: 59

## 2017-12-11 ENCOUNTER — Inpatient Hospital Stay: Payer: 59 | Attending: Oncology | Admitting: Oncology

## 2017-12-11 VITALS — BP 101/68 | HR 63 | Temp 98.2°F | Resp 18 | Wt 178.2 lb

## 2017-12-11 DIAGNOSIS — Z170 Estrogen receptor positive status [ER+]: Secondary | ICD-10-CM | POA: Diagnosis not present

## 2017-12-11 DIAGNOSIS — Z5112 Encounter for antineoplastic immunotherapy: Secondary | ICD-10-CM | POA: Diagnosis not present

## 2017-12-11 DIAGNOSIS — C50411 Malignant neoplasm of upper-outer quadrant of right female breast: Secondary | ICD-10-CM

## 2017-12-11 DIAGNOSIS — C7931 Secondary malignant neoplasm of brain: Secondary | ICD-10-CM | POA: Insufficient documentation

## 2017-12-11 MED ORDER — ACETAMINOPHEN 325 MG PO TABS
650.0000 mg | ORAL_TABLET | Freq: Once | ORAL | Status: AC
  Administered 2017-12-11: 650 mg via ORAL
  Filled 2017-12-11: qty 2

## 2017-12-11 MED ORDER — SODIUM CHLORIDE 0.9% FLUSH
10.0000 mL | INTRAVENOUS | Status: DC | PRN
  Filled 2017-12-11: qty 10

## 2017-12-11 MED ORDER — SODIUM CHLORIDE 0.9 % IV SOLN
6.0000 mg/kg | Freq: Once | INTRAVENOUS | Status: AC
  Administered 2017-12-11: 525 mg via INTRAVENOUS
  Filled 2017-12-11: qty 25

## 2017-12-11 MED ORDER — HEPARIN SOD (PORK) LOCK FLUSH 100 UNIT/ML IV SOLN
500.0000 [IU] | Freq: Once | INTRAVENOUS | Status: AC | PRN
  Administered 2017-12-11: 500 [IU]
  Filled 2017-12-11: qty 5

## 2017-12-11 MED ORDER — SODIUM CHLORIDE 0.9 % IV SOLN
Freq: Once | INTRAVENOUS | Status: AC
Start: 2017-12-11 — End: 2017-12-11
  Administered 2017-12-11: 11:00:00 via INTRAVENOUS
  Filled 2017-12-11: qty 1000

## 2017-12-11 MED ORDER — DIPHENHYDRAMINE HCL 25 MG PO CAPS
25.0000 mg | ORAL_CAPSULE | Freq: Once | ORAL | Status: AC
  Administered 2017-12-11: 25 mg via ORAL
  Filled 2017-12-11: qty 1

## 2017-12-17 ENCOUNTER — Ambulatory Visit
Admission: RE | Admit: 2017-12-17 | Discharge: 2017-12-17 | Disposition: A | Discharge status: Home / Self Care | Payer: 59 | Source: Ambulatory Visit | Attending: Oncology | Admitting: Oncology

## 2017-12-17 DIAGNOSIS — Z780 Asymptomatic menopausal state: Secondary | ICD-10-CM | POA: Diagnosis not present

## 2017-12-17 DIAGNOSIS — M85851 Other specified disorders of bone density and structure, right thigh: Secondary | ICD-10-CM | POA: Insufficient documentation

## 2017-12-17 DIAGNOSIS — C50919 Malignant neoplasm of unspecified site of unspecified female breast: Secondary | ICD-10-CM | POA: Insufficient documentation

## 2017-12-17 HISTORY — DX: Personal history of irradiation: Z92.3

## 2017-12-17 HISTORY — DX: Personal history of antineoplastic chemotherapy: Z92.21

## 2017-12-26 DIAGNOSIS — E034 Atrophy of thyroid (acquired): Secondary | ICD-10-CM | POA: Diagnosis not present

## 2017-12-29 NOTE — Progress Notes (Signed)
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   INTERVAL HISTORY: Patient returns to clinic today for further evaluation and continuation of maintenance Herceptin.  She continues to feel well and remains asymptomatic. She does not complain of any weakness or fatigue today.  She continues to be active and work full-time.  She has no neurologic complaints.  She denies any recent fevers or illnesses.  She has a fair appetite and has maintained her weight.  She has no chest pain, cough, or shortness of breath. She has no urinary complaints.  Patient offers no specific complaints today.  REVIEW OF SYSTEMS:   Review of Systems  Constitutional: Negative.  Negative for fever, malaise/fatigue and weight loss.  HENT: Negative.  Negative for congestion and hearing loss.   Respiratory: Negative.  Negative for cough and shortness of breath.   Cardiovascular: Negative.  Negative for chest pain and leg swelling.  Gastrointestinal: Negative.  Negative for abdominal pain, diarrhea and vomiting.  Genitourinary: Negative.  Negative for dysuria.  Musculoskeletal: Negative.  Negative for back pain.  Skin: Negative.  Negative for itching and rash.  Neurological: Negative.  Negative for sensory change, focal weakness and weakness.  Psychiatric/Behavioral: Negative.  The patient is not nervous/anxious and does not have insomnia.    As per HPI. Otherwise, a complete review of systems is negative.   PAST MEDICAL HISTORY:  Hypothyroidism, migraines, depression,  asthma.  PAST SURGICAL HISTORY: Bilateral mastectomy with reconstruction, partial hysterectomy.  FAMILY HISTORY: Lung cancer, melanoma, stomach cancer.  Also diabetes, CAD, hypertension     ADVANCED DIRECTIVES:    HEALTH MAINTENANCE: Social History   Tobacco Use  . Smoking status: Never Smoker  . Smokeless tobacco: Never Used  Substance Use Topics  . Alcohol use: No  . Drug use: No     Colonoscopy:  PAP:  Bone density:  Lipid panel:  Allergies  Allergen Reactions  . Codeine Other (See Comments)    constipation  . Sulfa Antibiotics Other (See Comments)    Allergy as a child    Current Outpatient Medications  Medication Sig Dispense Refill  . alendronate (FOSAMAX) 70 MG tablet TAKE 1 TABLET BY MOUTH  EVERY WEEK 12 tablet 0  . calcium-vitamin D (OSCAL WITH D) 250-125 MG-UNIT tablet Take 1 tablet by mouth daily.    . Cholecalciferol (D3 HIGH POTENCY) 2000 units CAPS Take 1 capsule by mouth daily.    . DULoxetine (CYMBALTA) 30 MG capsule Take 90 mg by mouth every morning.     . fluticasone (FLONASE) 50 MCG/ACT nasal spray SPRAY TWICE IEN ONCE D  12  . fluticasone (FLOVENT HFA) 110 MCG/ACT inhaler Inhale 1 puff into the lungs 2 (two) times daily.    Marland Kitchen levothyroxine (SYNTHROID, LEVOTHROID) 137 MCG tablet Take 137 mcg by mouth daily before breakfast.     . lidocaine-prilocaine (EMLA) cream Apply to affected area once 30 g 3  . metoprolol succinate (TOPROL-XL) 25 MG 24 hr tablet Take 75 mg by mouth at bedtime.     . Multiple Vitamin (MULTIVITAMIN) tablet Take 1 tablet by mouth daily.    Marland Kitchen  ofloxacin (FLOXIN) 0.3 % OTIC solution INSTILL 4 GTS INTO RIGHT EAR EVERY NIGHT AT BEDTIME FOR 4 DAYS  3  . phenazopyridine (PYRIDIUM) 100 MG tablet Take 1 tablet (100 mg total) by mouth 3 (three) times daily as needed for pain. 10 tablet 0  . topiramate (TOPAMAX) 50 MG tablet Take 50 mg by mouth at bedtime.     . Triamcinolone Acetonide (TRIAMCINOLONE 0.1 % CREAM : EUCERIN) CREA Apply 1  application topically 2 (two) times daily as needed. 1 each 0  . ALPRAZolam (XANAX) 0.25 MG tablet Take 1 tablet by mouth 3 (three) times daily as needed for anxiety.     Marland Kitchen azelastine (ASTELIN) 0.1 % nasal spray Place 1 spray into both nostrils daily as needed for rhinitis. Use in each nostril as directed    . cyclobenzaprine (FLEXERIL) 10 MG tablet Take 10 mg by mouth 3 (three) times daily as needed.     . ondansetron (ZOFRAN) 8 MG tablet Take 1 tablet (8 mg total) by mouth 2 (two) times daily as needed (Nausea or vomiting). (Patient not taking: Reported on 01/01/2018) 60 tablet 3  . prochlorperazine (COMPAZINE) 10 MG tablet Take 1 tablet (10 mg total) by mouth every 6 (six) hours as needed (Nausea or vomiting). (Patient not taking: Reported on 01/01/2018) 60 tablet 3  . traMADol (ULTRAM) 50 MG tablet Take 25-50 mg by mouth every 6 (six) hours as needed.      No current facility-administered medications for this visit.     OBJECTIVE: Vitals:   01/01/18 0917  BP: 99/67  Pulse: 71  Resp: 18  Temp: 98.2 F (36.8 C)     Body mass index is 31.71 kg/m.    ECOG FS:0 - Asymptomatic  General: Well-developed, well-nourished, no acute distress. Eyes: Pink conjunctiva, anicteric sclera. Breasts: Patient has bilateral mastectomies with reconstruction, no obvious evidence of local recurrence. Lungs: Clear to auscultation bilaterally. Heart: Regular rate and rhythm. No rubs, murmurs, or gallops. Abdomen: Soft, nontender, nondistended. No organomegaly noted, normoactive bowel sounds. Musculoskeletal: No edema, cyanosis, or clubbing. Neuro: Alert, answering all questions appropriately. Cranial nerves grossly intact. Skin: No rashes or petechiae noted. Psych: Normal affect.  LAB RESULTS:  Lab Results  Component Value Date   NA 136 07/23/2017   K 3.6 07/23/2017   CL 107 07/23/2017   CO2 19 (L) 07/23/2017   GLUCOSE 117 (H) 07/23/2017   BUN 13 07/23/2017   CREATININE 0.60 09/25/2017   CALCIUM  9.6 07/23/2017   PROT 7.4 07/23/2017   ALBUMIN 4.0 07/23/2017   AST 26 07/23/2017   ALT 18 07/23/2017   ALKPHOS 68 07/23/2017   BILITOT 0.6 07/23/2017   GFRNONAA >60 07/23/2017   GFRAA >60 07/23/2017    Lab Results  Component Value Date   WBC 3.8 07/23/2017   NEUTROABS 2.8 07/23/2017   HGB 12.4 07/23/2017   HCT 36.3 07/23/2017   MCV 96.7 07/23/2017   PLT 331 07/23/2017     STUDIES: Dg Bone Density  Result Date: 12/17/2017 EXAM: DUAL X-RAY ABSORPTIOMETRY (DXA) FOR BONE MINERAL DENSITY IMPRESSION: Dear Dr Grayland Ormond, Your patient Irlene Crudup completed a BMD test on 12/17/2017 using the [place] (analysis version: 14.10) manufactured by [employer]. The following summarizes the results of our evaluation. PATIENT BIOGRAPHICAL: Name: Marrah, Vanevery Patient ID: [id number] Birth Date: 02-10-1956 Height: 61.0 in. Gender: Female Exam Date: 12/17/2017 Weight: 177.2 lbs. Indications: asthma, Breast CA, Caucasian, Height Loss, High Risk Meds, History of Breast Cancer, History of  Chemo, History of Fracture (Adult), History of Radiation, Hypothyroid, Hysterectomy, Oophorectomy Unilateral, Parent Hip Fracture, Surgical Induced Menopause, Vitamin D Deficiency Fractures: Left finger Treatments: ASPRIN 81 MG, Calcium, fosamax, Herceptin, LEVOTHYROXINE, Levothyroxine, Multi-Vitamin, Vitamin D ASSESSMENT: The BMD measured at Femur Neck Right is 0.799 g/cm2 with a T-score of -1.7. This patient is considered OSTEOPENIC according to [place] [facility]) criteria. L1 and L2 were excluded due to degenerative changes. Patient is not a candidate for FRAX due to Fosamax. Site Region Measured Measured WHO Young Adult BMD Date       Age      Classification T-score AP Spine L3-L4 12/17/2017 62.2 Normal -0.4 1.164 g/cm2 AP Spine L3-L4 08/30/2015 59.9 Normal -0.9 1.102 g/cm2 AP Spine L3-L4 02/11/2013 57.4 Osteopenia -1.5 1.026 g/cm2 AP Spine L3-L4 03/21/2011 55.5 Normal -0.6 1.139 g/cm2 DualFemur Neck  Right 12/17/2017 62.2 Osteopenia -1.7 0.799 g/cm2 DualFemur Neck Right 08/30/2015 59.9 Osteopenia -1.5 0.824 g/cm2 DualFemur Neck Right 03/17/2014 58.5 Osteopenia -1.6 0.820 g/cm2 DualFemur Neck Right 02/11/2013 57.4 Osteopenia -1.9 0.773 g/cm2 DualFemur Neck Right 03/21/2011 55.5 Osteopenia -1.6 0.822 g/cm2 DualFemur Total Mean 12/17/2017 62.2 Normal -0.9 0.899 g/cm2 DualFemur Total Mean 08/30/2015 59.9 Normal -0.6 0.929 g/cm2 DualFemur Total Mean 03/17/2014 58.5 Normal -0.9 0.894 g/cm2 DualFemur Total Mean 02/11/2013 57.4 Osteopenia -1.1 0.869 g/cm2 DualFemur Total Mean 03/21/2011 55.5 Normal -0.9 0.899 g/cm2 World Health Organization [facility]) criteria for post-menopausal, Caucasian Women: Normal:       T-score at or above -1 SD Osteopenia:   T-score between -1 and -2.5 SD Osteoporosis: T-score at or below -2.5 SD RECOMMENDATIONS: 1. All patients should optimize calcium and vitamin D intake. 2. Consider FDA-approved medical therapies in postmenopausal women and men aged 69 years and older, based on the following: a. A hip or vertebral(clinical or morphometric) fracture b. T-score < -2.5 at the femoral neck or spine after appropriate evaluation to exclude secondary causes c. Low bone mass (T-score between -1.0 and -2.5 at the femoral neck or spine) and a 10-year probability of a hip fracture > 3% or a 10-year probability of a major osteoporosis-related fracture > 20% based on the [place]-adapted WHO algorithm d. Clinician judgment and/or patient preferences may indicate treatment for people with 10-year fracture probabilities above or below these levels FOLLOW-UP: People with diagnosed cases of osteoporosis or at high risk for fracture should have regular bone mineral density tests. For patients eligible for Medicare, routine testing is allowed once every 2 years. The testing frequency can be increased to one year for patients who have rapidly progressing disease, those who are receiving or discontinuing medical therapy to  restore bone mass, or have additional risk factors. I have reviewed this report, and agree with the above findings. Mark A. Thornton Papas, M.D. [facility] Radiology Electronically Signed   By: Lavonia Dana M.D.   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    PLAN:    1. 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  Brain MRI reveals continued improvement of patient's for known  metastatic deposits.  Patient's CA-27.29 continues to be within normal limits at 18.0.  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  Cardiac echo completed on October 30, 2017 revealed an estimated EF of 50-55%.  There is no pericardial effusion.  Repeat cardiac echo prior to cycle 13.  Proceed with cycle 11 of 18 of maintenance Herceptin today.  Return to clinic in 3 weeks for Herceptin only and then in 6 weeks for further evaluation and consideration of cycle 13.  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 2. Brain metastasis: Essentially resolved.  Patient has completed XRT.  MRI from September 25, 2017 reviewed independently with continued improvement of known metastatic lesions.  No further imaging is necessary unless there is suspicion of recurrence.   3. Malignant pericardial fluid: Resolved.  Cardiac echo as above. Continue follow-up with cardiology as  indicated. 4.  Erythema/rash: Resolved. Unclear etiology, possibly related to XRT.  5.  Diarrhea/vomiting: Resolved.  Approximately 30 minutes was spent in discussion of which greater than 50% was consultation.  Patient expressed understanding and was in agreement with this plan. She also understands that She can call clinic at any time with any questions, concerns, or complaints.    Breast cancer   Staging form: Breast, AJCC 7th Edition     Clinical stage from 02/14/2015: Stage IA (T1c, N0, M0) - Signed by Lloyd Huger, MD on 02/14/2015   Lloyd Huger, MD   01/04/2018 5:03 PM

## 2017-12-30 DIAGNOSIS — C50411 Malignant neoplasm of upper-outer quadrant of right female breast: Secondary | ICD-10-CM | POA: Diagnosis not present

## 2018-01-01 ENCOUNTER — Inpatient Hospital Stay (HOSPITAL_BASED_OUTPATIENT_CLINIC_OR_DEPARTMENT_OTHER): Payer: 59 | Admitting: Oncology

## 2018-01-01 ENCOUNTER — Encounter: Payer: Self-pay | Admitting: Oncology

## 2018-01-01 ENCOUNTER — Other Ambulatory Visit: Payer: Self-pay

## 2018-01-01 ENCOUNTER — Inpatient Hospital Stay: Payer: 59

## 2018-01-01 VITALS — BP 99/67 | HR 71 | Temp 98.2°F | Resp 18 | Wt 179.0 lb

## 2018-01-01 DIAGNOSIS — C50411 Malignant neoplasm of upper-outer quadrant of right female breast: Secondary | ICD-10-CM

## 2018-01-01 DIAGNOSIS — C7931 Secondary malignant neoplasm of brain: Secondary | ICD-10-CM | POA: Diagnosis not present

## 2018-01-01 DIAGNOSIS — Z5112 Encounter for antineoplastic immunotherapy: Secondary | ICD-10-CM | POA: Diagnosis not present

## 2018-01-01 MED ORDER — DIPHENHYDRAMINE HCL 25 MG PO CAPS
25.0000 mg | ORAL_CAPSULE | Freq: Once | ORAL | Status: AC
  Administered 2018-01-01: 25 mg via ORAL
  Filled 2018-01-01: qty 1

## 2018-01-01 MED ORDER — SODIUM CHLORIDE 0.9 % IV SOLN
Freq: Once | INTRAVENOUS | Status: AC
  Administered 2018-01-01: 10:00:00 via INTRAVENOUS
  Filled 2018-01-01: qty 1000

## 2018-01-01 MED ORDER — SODIUM CHLORIDE 0.9% FLUSH
10.0000 mL | INTRAVENOUS | Status: DC | PRN
  Administered 2018-01-01: 10 mL
  Filled 2018-01-01: qty 10

## 2018-01-01 MED ORDER — ACETAMINOPHEN 325 MG PO TABS
650.0000 mg | ORAL_TABLET | Freq: Once | ORAL | Status: AC
  Administered 2018-01-01: 650 mg via ORAL
  Filled 2018-01-01: qty 2

## 2018-01-01 MED ORDER — HEPARIN SOD (PORK) LOCK FLUSH 100 UNIT/ML IV SOLN
500.0000 [IU] | Freq: Once | INTRAVENOUS | Status: AC | PRN
  Administered 2018-01-01: 500 [IU]
  Filled 2018-01-01: qty 5

## 2018-01-01 MED ORDER — TRASTUZUMAB CHEMO 150 MG IV SOLR
6.0000 mg/kg | Freq: Once | INTRAVENOUS | Status: AC
  Administered 2018-01-01: 525 mg via INTRAVENOUS
  Filled 2018-01-01: qty 25

## 2018-01-01 NOTE — Progress Notes (Signed)
Here for follow up. Overall doing pretty good per pt

## 2018-01-02 DIAGNOSIS — E034 Atrophy of thyroid (acquired): Secondary | ICD-10-CM | POA: Diagnosis not present

## 2018-01-02 DIAGNOSIS — M5136 Other intervertebral disc degeneration, lumbar region: Secondary | ICD-10-CM | POA: Diagnosis not present

## 2018-01-02 DIAGNOSIS — I471 Supraventricular tachycardia: Secondary | ICD-10-CM | POA: Diagnosis not present

## 2018-01-22 ENCOUNTER — Inpatient Hospital Stay: Payer: 59 | Attending: Oncology

## 2018-01-22 ENCOUNTER — Other Ambulatory Visit: Payer: Self-pay | Admitting: Oncology

## 2018-01-22 VITALS — BP 93/65 | HR 72 | Temp 97.2°F | Resp 18 | Wt 175.2 lb

## 2018-01-22 DIAGNOSIS — C7931 Secondary malignant neoplasm of brain: Secondary | ICD-10-CM | POA: Diagnosis not present

## 2018-01-22 DIAGNOSIS — C50411 Malignant neoplasm of upper-outer quadrant of right female breast: Secondary | ICD-10-CM | POA: Insufficient documentation

## 2018-01-22 DIAGNOSIS — Z5112 Encounter for antineoplastic immunotherapy: Secondary | ICD-10-CM | POA: Insufficient documentation

## 2018-01-22 MED ORDER — DIPHENHYDRAMINE HCL 25 MG PO CAPS
25.0000 mg | ORAL_CAPSULE | Freq: Once | ORAL | Status: AC
  Administered 2018-01-22: 25 mg via ORAL
  Filled 2018-01-22: qty 1

## 2018-01-22 MED ORDER — SODIUM CHLORIDE 0.9% FLUSH
10.0000 mL | INTRAVENOUS | Status: DC | PRN
  Administered 2018-01-22: 10 mL
  Filled 2018-01-22: qty 10

## 2018-01-22 MED ORDER — HEPARIN SOD (PORK) LOCK FLUSH 100 UNIT/ML IV SOLN
500.0000 [IU] | Freq: Once | INTRAVENOUS | Status: AC | PRN
  Administered 2018-01-22: 500 [IU]
  Filled 2018-01-22: qty 5

## 2018-01-22 MED ORDER — ACETAMINOPHEN 325 MG PO TABS
650.0000 mg | ORAL_TABLET | Freq: Once | ORAL | Status: AC
  Administered 2018-01-22: 650 mg via ORAL
  Filled 2018-01-22: qty 2

## 2018-01-22 MED ORDER — SODIUM CHLORIDE 0.9 % IV SOLN
Freq: Once | INTRAVENOUS | Status: AC
  Administered 2018-01-22: 10:00:00 via INTRAVENOUS
  Filled 2018-01-22: qty 1000

## 2018-01-22 MED ORDER — TRASTUZUMAB CHEMO 150 MG IV SOLR
6.0000 mg/kg | Freq: Once | INTRAVENOUS | Status: AC
  Administered 2018-01-22: 525 mg via INTRAVENOUS
  Filled 2018-01-22: qty 25

## 2018-01-30 ENCOUNTER — Ambulatory Visit
Admission: RE | Admit: 2018-01-30 | Discharge: 2018-01-30 | Disposition: A | Discharge status: Home / Self Care | Payer: 59 | Source: Ambulatory Visit | Attending: Oncology | Admitting: Oncology

## 2018-01-30 DIAGNOSIS — F419 Anxiety disorder, unspecified: Secondary | ICD-10-CM | POA: Diagnosis not present

## 2018-01-30 DIAGNOSIS — E039 Hypothyroidism, unspecified: Secondary | ICD-10-CM | POA: Diagnosis not present

## 2018-01-30 DIAGNOSIS — Z923 Personal history of irradiation: Secondary | ICD-10-CM | POA: Insufficient documentation

## 2018-01-30 DIAGNOSIS — C50411 Malignant neoplasm of upper-outer quadrant of right female breast: Secondary | ICD-10-CM | POA: Insufficient documentation

## 2018-01-30 DIAGNOSIS — D649 Anemia, unspecified: Secondary | ICD-10-CM | POA: Insufficient documentation

## 2018-01-30 DIAGNOSIS — Z9221 Personal history of antineoplastic chemotherapy: Secondary | ICD-10-CM | POA: Diagnosis not present

## 2018-01-30 DIAGNOSIS — I340 Nonrheumatic mitral (valve) insufficiency: Secondary | ICD-10-CM | POA: Insufficient documentation

## 2018-01-30 DIAGNOSIS — E785 Hyperlipidemia, unspecified: Secondary | ICD-10-CM | POA: Diagnosis not present

## 2018-01-30 DIAGNOSIS — I82409 Acute embolism and thrombosis of unspecified deep veins of unspecified lower extremity: Secondary | ICD-10-CM | POA: Diagnosis not present

## 2018-01-30 NOTE — Progress Notes (Signed)
*  PRELIMINARY RESULTS* Echocardiogram 2D Echocardiogram has been performed.  Sherrie Sport 01/30/2018, 12:35 PM

## 2018-02-10 DIAGNOSIS — C50411 Malignant neoplasm of upper-outer quadrant of right female breast: Secondary | ICD-10-CM | POA: Diagnosis not present

## 2018-02-11 ENCOUNTER — Other Ambulatory Visit: Payer: Self-pay

## 2018-02-11 ENCOUNTER — Emergency Department: Payer: No Typology Code available for payment source

## 2018-02-11 ENCOUNTER — Encounter: Payer: Self-pay | Admitting: Emergency Medicine

## 2018-02-11 ENCOUNTER — Emergency Department
Admission: EM | Admit: 2018-02-11 | Discharge: 2018-02-11 | Disposition: A | Discharge status: Home / Self Care | Payer: No Typology Code available for payment source | Attending: Emergency Medicine | Admitting: Emergency Medicine

## 2018-02-11 DIAGNOSIS — Z79899 Other long term (current) drug therapy: Secondary | ICD-10-CM | POA: Insufficient documentation

## 2018-02-11 DIAGNOSIS — E039 Hypothyroidism, unspecified: Secondary | ICD-10-CM | POA: Diagnosis not present

## 2018-02-11 DIAGNOSIS — S0990XA Unspecified injury of head, initial encounter: Secondary | ICD-10-CM

## 2018-02-11 DIAGNOSIS — Z853 Personal history of malignant neoplasm of breast: Secondary | ICD-10-CM | POA: Insufficient documentation

## 2018-02-11 DIAGNOSIS — J45909 Unspecified asthma, uncomplicated: Secondary | ICD-10-CM | POA: Insufficient documentation

## 2018-02-11 DIAGNOSIS — Y929 Unspecified place or not applicable: Secondary | ICD-10-CM | POA: Diagnosis not present

## 2018-02-11 DIAGNOSIS — W01198A Fall on same level from slipping, tripping and stumbling with subsequent striking against other object, initial encounter: Secondary | ICD-10-CM | POA: Diagnosis not present

## 2018-02-11 DIAGNOSIS — Y998 Other external cause status: Secondary | ICD-10-CM | POA: Insufficient documentation

## 2018-02-11 DIAGNOSIS — S0083XA Contusion of other part of head, initial encounter: Secondary | ICD-10-CM | POA: Insufficient documentation

## 2018-02-11 DIAGNOSIS — S8002XA Contusion of left knee, initial encounter: Secondary | ICD-10-CM | POA: Diagnosis not present

## 2018-02-11 DIAGNOSIS — Y9301 Activity, walking, marching and hiking: Secondary | ICD-10-CM | POA: Insufficient documentation

## 2018-02-11 DIAGNOSIS — M25562 Pain in left knee: Secondary | ICD-10-CM | POA: Diagnosis not present

## 2018-02-11 DIAGNOSIS — S8992XA Unspecified injury of left lower leg, initial encounter: Secondary | ICD-10-CM | POA: Diagnosis not present

## 2018-02-11 MED ORDER — ACETAMINOPHEN 325 MG PO TABS
650.0000 mg | ORAL_TABLET | Freq: Once | ORAL | Status: AC
  Administered 2018-02-11: 650 mg via ORAL
  Filled 2018-02-11: qty 2

## 2018-02-11 NOTE — ED Notes (Signed)
Waiting on pt to urinate for Drug Screen.

## 2018-02-11 NOTE — ED Triage Notes (Signed)
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  States hit edge around glass door, no broken glass, no LOC, denies dizzy or light headedness prior to falling, thinks she tripped on a rug while going to lunch.  Denies blood thinners, small abrasion noted to forehead no bleeding.

## 2018-02-11 NOTE — ED Notes (Signed)
FIRST NURSE NOTE:  Pt had fall at work at [employer] states she hit her head on the door. Denies LOC.

## 2018-02-11 NOTE — ED Notes (Addendum)
See triage note  Presents s/p fall  States she caught her foot  [address] face first  Hitting forehead  Hematoma noted to forehead  Also having pain to left knee

## 2018-02-11 NOTE — Progress Notes (Signed)
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   INTERVAL HISTORY: Patient returns to clinic today for further evaluation and consideration of cycle 13 of maintenance Herceptin.  He had a fall yesterday while at work and was evaluated by the ER.  Patient states she tripped and did not have any dizziness, headache, or loss of consciousness.  Today she is slightly sore, but otherwise feels well.  She does not complain of any weakness or fatigue today.  She has no neurologic complaints.  She denies any recent fevers or illnesses.  She has a fair appetite and has maintained her weight.  She has no chest pain, cough, or shortness of breath. She has no urinary complaints.  Patient offers no further specific complaints today.  REVIEW OF SYSTEMS:   Review of Systems  Constitutional: Negative.  Negative for fever, malaise/fatigue and weight loss.  HENT: Negative.  Negative for congestion and hearing loss.   Respiratory: Negative.  Negative for cough and shortness of breath.   Cardiovascular: Negative.  Negative for chest pain and leg swelling.  Gastrointestinal: Negative.  Negative for abdominal pain, diarrhea and vomiting.  Genitourinary: Negative.  Negative for dysuria.  Musculoskeletal: Positive for falls. Negative for back pain.  Skin: Negative.  Negative for itching and rash.  Neurological: Negative.  Negative for sensory change, focal weakness and weakness.  Psychiatric/Behavioral: Negative.  The patient is not nervous/anxious and does not have insomnia.     As per HPI. Otherwise, a complete review of systems is negative.   PAST MEDICAL HISTORY:  Hypothyroidism, migraines, depression, asthma.  PAST SURGICAL HISTORY: Bilateral mastectomy with reconstruction, partial hysterectomy.  FAMILY HISTORY: Lung cancer, melanoma, stomach cancer.  Also diabetes, CAD, hypertension     ADVANCED DIRECTIVES:    HEALTH MAINTENANCE: Social History   Tobacco Use  . Smoking status: Never Smoker  . Smokeless tobacco: Never Used  Substance Use Topics  . Alcohol use: No  . Drug use: No     Colonoscopy:  PAP:  Bone density:  Lipid panel:  Allergies  Allergen Reactions  . Codeine Other (See Comments)    constipation  . Sulfa Antibiotics Other (See Comments)    Allergy as a child    Current Outpatient Medications  Medication Sig Dispense Refill  . alendronate (FOSAMAX) 70 MG tablet TAKE 1 TABLET BY MOUTH  EVERY WEEK 12 tablet 0  . azelastine (ASTELIN) 0.1 % nasal spray Place 1 spray into both nostrils daily as needed for rhinitis. Use in each nostril as directed    . calcium-vitamin D (OSCAL WITH D) 250-125 MG-UNIT tablet Take 1 tablet by mouth daily.    . Cholecalciferol (D3 HIGH POTENCY) 2000 units CAPS Take 1 capsule by mouth daily.    . DULoxetine (CYMBALTA) 30 MG capsule Take 90 mg by mouth every morning.     . fluticasone (FLONASE) 50 MCG/ACT nasal spray SPRAY TWICE IEN ONCE D  12  . levothyroxine (SYNTHROID, LEVOTHROID) 137 MCG tablet Take by mouth daily before breakfast.     . lidocaine-prilocaine (EMLA) cream Apply to affected area once 30 g 3  .  metoprolol succinate (TOPROL-XL) 25 MG 24 hr tablet Take 75 mg by mouth at bedtime.     . Multiple Vitamin (MULTIVITAMIN) tablet Take 1 tablet by mouth daily.    Marland Kitchen topiramate (TOPAMAX) 50 MG tablet Take 50 mg by mouth at bedtime.     . ALPRAZolam (XANAX) 0.25 MG tablet Take 1 tablet by mouth 3 (three) times daily as needed for anxiety.     . cyclobenzaprine (FLEXERIL) 10 MG tablet Take 10 mg  by mouth 3 (three) times daily as needed.     . fluticasone (FLOVENT HFA) 110 MCG/ACT inhaler Inhale 1 puff into the lungs 2 (two) times daily.    Marland Kitchen ofloxacin (FLOXIN) 0.3 % OTIC solution INSTILL 4 GTS INTO RIGHT EAR EVERY NIGHT AT BEDTIME FOR 4 DAYS  3  . ondansetron (ZOFRAN) 8 MG tablet Take 1 tablet (8 mg total) by mouth 2 (two) times daily as needed (Nausea or vomiting). (Patient not taking: Reported on 01/01/2018) 60 tablet 3  . phenazopyridine (PYRIDIUM) 100 MG tablet Take 1 tablet (100 mg total) by mouth 3 (three) times daily as needed for pain. (Patient not taking: Reported on 02/12/2018) 10 tablet 0  . prochlorperazine (COMPAZINE) 10 MG tablet Take 1 tablet (10 mg total) by mouth every 6 (six) hours as needed (Nausea or vomiting). (Patient not taking: Reported on 01/01/2018) 60 tablet 3  . traMADol (ULTRAM) 50 MG tablet Take 25-50 mg by mouth every 6 (six) hours as needed.     . Triamcinolone Acetonide (TRIAMCINOLONE 0.1 % CREAM : EUCERIN) CREA Apply 1 application topically 2 (two) times daily as needed. (Patient not taking: Reported on 02/12/2018) 1 each 0   No current facility-administered medications for this visit.     OBJECTIVE: Vitals:   02/12/18 1001  BP: (!) 103/59  Pulse: 76  Resp: 18  Temp: (!) 96.9 F (36.1 C)     Body mass index is 30.77 kg/m.    ECOG FS:0 - Asymptomatic  General: Well-developed, well-nourished, no acute distress. Eyes: Pink conjunctiva, anicteric sclera. HEENT: Normocephalic, moist mucous membranes, clear oropharnyx.  Laceration on forehead that did not require sutures. Breasts: Patient has bilateral mastectomies with reconstruction, no obvious evidence of local recurrence. Lungs: Clear to auscultation bilaterally. Heart: Regular rate and rhythm. No rubs, murmurs, or gallops. Abdomen: Soft, nontender, nondistended. No organomegaly noted, normoactive bowel sounds. Musculoskeletal: No edema, cyanosis, or clubbing. Neuro: Alert, answering all questions  appropriately. Cranial nerves grossly intact. Skin: No rashes or petechiae noted. Psych: Normal affect.  LAB RESULTS:  Lab Results  Component Value Date   NA 136 07/23/2017   K 3.6 07/23/2017   CL 107 07/23/2017   CO2 19 (L) 07/23/2017   GLUCOSE 117 (H) 07/23/2017   BUN 13 07/23/2017   CREATININE 0.60 09/25/2017   CALCIUM 9.6 07/23/2017   PROT 7.4 07/23/2017   ALBUMIN 4.0 07/23/2017   AST 26 07/23/2017   ALT 18 07/23/2017   ALKPHOS 68 07/23/2017   BILITOT 0.6 07/23/2017   GFRNONAA >60 07/23/2017   GFRAA >60 07/23/2017    Lab Results  Component Value Date   WBC 3.8 07/23/2017   NEUTROABS 2.8 07/23/2017   HGB 12.4 07/23/2017   HCT 36.3 07/23/2017   MCV 96.7 07/23/2017   PLT 331 07/23/2017     STUDIES: Ct Head Wo Contrast  Result Date: 02/11/2018 CLINICAL DATA:  Fall. Hit head on door. Initial encounter. History of metastatic breast cancer. EXAM: CT HEAD WITHOUT CONTRAST TECHNIQUE: Contiguous axial images were  obtained from the base of the skull through the vertex without intravenous contrast. COMPARISON:  Brain MRI 09/25/2017 FINDINGS: Brain: There is no evidence of acute infarct, definite acute intracranial hemorrhage, midline shift, or extra-axial fluid collection. A 1.9 cm treated metastasis in the left parietal lobe has not significantly changed in size from the prior MRI although there is evidence of new mild edema. Hyperdensity associated with the inferior aspect of this lesion is more compatible with calcification than blood. A subcentimeter low-density focus in the anterior left putamen/external capsule region corresponds to a previously treated metastasis. A 1.2 cm partially calcified right frontal extra-axial mass is unchanged and consistent with a meningioma as previously described. The ventricles and sulci are within normal limits for age. Vascular: No hyperdense vessel. Skull: No fracture or suspicious osseous lesion. Sinuses/Orbits: Visualized paranasal sinuses are  clear. There are persistent small right and large left mastoid effusions. The visualized portions of the orbits are unremarkable. Other: None. IMPRESSION: 1. No evidence of acute intracranial abnormality. 2. Previously treated brain metastases. Calcification and mild edema associated with a left parietal metastasis. 3. Unchanged right frontal meningioma. Electronically Signed   By: Logan Bores M.D.   On: 02/11/2018 15:34   Dg Knee Complete 4 Views Left  Result Date: 02/11/2018 CLINICAL DATA:  The patient fell at work and has left knee pain. EXAM: LEFT KNEE - COMPLETE 4+ VIEW COMPARISON:  None in PACs FINDINGS: The bones are subjectively adequately mineralized. There is no acute fracture nor dislocation. The joint spaces are well maintained. There is mild beaking of the medial tibial spine. There is no chondrocalcinosis of the menisci. There is no joint effusion. Small spurs arise from the articular margins of the patella. IMPRESSION: Mild degenerative change centered on the medial tibial spine and on the articular margins of the patella superiorly and inferiorly. No acute fracture or dislocation. Electronically Signed   By: David  Martinique M.D.   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    PLAN:    1. 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  Brain MRI reveals continued improvement of patient's for known metastatic deposits.   Patient's CA-27.29 continues to be within normal limits.  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  Cardiac echo completed on January 30, 2018 revealed an estimated EF of 55 to 60%.  No pericardial effusion was reported.  Repeat echo in September 2019.  Proceed with cycle 13 of 18 of maintenance Herceptin today.  Return to clinic in 3 weeks for cycle 14 only and then in 6 weeks for further evaluation and consideration of cycle 15.  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 2. Brain metastasis: Essentially resolved.  Patient has completed XRT.  MRI from September 25, 2017 reviewed independently with continued improvement of known metastatic lesions.  CT scan of head done after patient's fall on February 11, 2018 reviewed independently and  reported as above with no new or progressive disease.  No further imaging is necessary unless there is suspicion of recurrence.   3. Malignant pericardial fluid: Resolved.  Cardiac echo as above. Continue follow-up with cardiology as indicated. 4.  Erythema/rash: Resolved. Unclear etiology, possibly related to XRT.  5.  Diarrhea/vomiting: Resolved.  I spent a total of 30 minutes face-to-face with the patient of which greater than 50% of the visit was spent in counseling and coordination of care as summarized above.  Patient expressed understanding and was in agreement with this plan. She also understands that She can call clinic at any time with any questions, concerns, or complaints.    Breast cancer   Staging form: Breast, AJCC 7th Edition     Clinical stage from 02/14/2015: Stage IA (T1c, N0, M0) - Signed by Lloyd Huger, MD on 02/14/2015   Lloyd Huger, MD   02/14/2018 6:44 AM

## 2018-02-11 NOTE — Discharge Instructions (Addendum)
Your exam, CT, and X-ray are negative for any acute findings. Take your home meds as needed. Apply ice to reduce swelling. Follow-up with your provider for ongoing symptoms.
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
## 2018-02-12 ENCOUNTER — Inpatient Hospital Stay: Payer: 59 | Attending: Oncology | Admitting: Oncology

## 2018-02-12 ENCOUNTER — Inpatient Hospital Stay: Payer: 59

## 2018-02-12 ENCOUNTER — Other Ambulatory Visit: Payer: Self-pay

## 2018-02-12 VITALS — BP 103/59 | HR 76 | Temp 96.9°F | Resp 18 | Wt 173.7 lb

## 2018-02-12 DIAGNOSIS — Z5112 Encounter for antineoplastic immunotherapy: Secondary | ICD-10-CM | POA: Insufficient documentation

## 2018-02-12 DIAGNOSIS — C50411 Malignant neoplasm of upper-outer quadrant of right female breast: Secondary | ICD-10-CM | POA: Diagnosis not present

## 2018-02-12 DIAGNOSIS — C7931 Secondary malignant neoplasm of brain: Secondary | ICD-10-CM

## 2018-02-12 DIAGNOSIS — Z9181 History of falling: Secondary | ICD-10-CM | POA: Diagnosis not present

## 2018-02-12 MED ORDER — SODIUM CHLORIDE 0.9 % IV SOLN
Freq: Once | INTRAVENOUS | Status: AC
  Administered 2018-02-12: 11:00:00 via INTRAVENOUS
  Filled 2018-02-12: qty 1000

## 2018-02-12 MED ORDER — TRASTUZUMAB CHEMO 150 MG IV SOLR
6.0000 mg/kg | Freq: Once | INTRAVENOUS | Status: AC
  Administered 2018-02-12: 525 mg via INTRAVENOUS
  Filled 2018-02-12: qty 25

## 2018-02-12 MED ORDER — SODIUM CHLORIDE 0.9% FLUSH
10.0000 mL | INTRAVENOUS | Status: DC | PRN
  Administered 2018-02-12: 10 mL
  Filled 2018-02-12: qty 10

## 2018-02-12 MED ORDER — HEPARIN SOD (PORK) LOCK FLUSH 100 UNIT/ML IV SOLN
500.0000 [IU] | Freq: Once | INTRAVENOUS | Status: AC | PRN
  Administered 2018-02-12: 500 [IU]
  Filled 2018-02-12: qty 5

## 2018-02-12 MED ORDER — DIPHENHYDRAMINE HCL 25 MG PO CAPS
25.0000 mg | ORAL_CAPSULE | Freq: Once | ORAL | Status: AC
  Administered 2018-02-12: 25 mg via ORAL
  Filled 2018-02-12: qty 1

## 2018-02-12 MED ORDER — ACETAMINOPHEN 325 MG PO TABS
650.0000 mg | ORAL_TABLET | Freq: Once | ORAL | Status: AC
  Administered 2018-02-12: 650 mg via ORAL
  Filled 2018-02-12: qty 2

## 2018-02-12 NOTE — Progress Notes (Signed)
Pt here for foolow up. Stated yesterday at [employer] ( her work ) she feel at lunchtime-hit forehead and L knee of metal area around glass doors. To ER-CT -neg. ,and saw ER Dr-findings neg. Using ice and motrin prn.

## 2018-02-18 ENCOUNTER — Encounter: Payer: Self-pay | Admitting: Oncology

## 2018-03-05 ENCOUNTER — Inpatient Hospital Stay: Payer: 59

## 2018-03-05 VITALS — BP 105/68 | HR 71 | Temp 97.5°F | Resp 20 | Wt 175.0 lb

## 2018-03-05 DIAGNOSIS — Z5112 Encounter for antineoplastic immunotherapy: Secondary | ICD-10-CM | POA: Diagnosis not present

## 2018-03-05 DIAGNOSIS — C50411 Malignant neoplasm of upper-outer quadrant of right female breast: Secondary | ICD-10-CM

## 2018-03-05 MED ORDER — SODIUM CHLORIDE 0.9 % IV SOLN
Freq: Once | INTRAVENOUS | Status: AC
  Administered 2018-03-05: 09:00:00 via INTRAVENOUS
  Filled 2018-03-05: qty 1000

## 2018-03-05 MED ORDER — ACETAMINOPHEN 325 MG PO TABS
650.0000 mg | ORAL_TABLET | Freq: Once | ORAL | Status: AC
  Administered 2018-03-05: 650 mg via ORAL
  Filled 2018-03-05: qty 2

## 2018-03-05 MED ORDER — DIPHENHYDRAMINE HCL 25 MG PO CAPS
25.0000 mg | ORAL_CAPSULE | Freq: Once | ORAL | Status: AC
  Administered 2018-03-05: 25 mg via ORAL
  Filled 2018-03-05: qty 1

## 2018-03-05 MED ORDER — SODIUM CHLORIDE 0.9% FLUSH
10.0000 mL | INTRAVENOUS | Status: DC | PRN
  Administered 2018-03-05: 10 mL
  Filled 2018-03-05: qty 10

## 2018-03-05 MED ORDER — TRASTUZUMAB CHEMO 150 MG IV SOLR
6.0000 mg/kg | Freq: Once | INTRAVENOUS | Status: AC
  Administered 2018-03-05: 525 mg via INTRAVENOUS
  Filled 2018-03-05: qty 25

## 2018-03-05 MED ORDER — HEPARIN SOD (PORK) LOCK FLUSH 100 UNIT/ML IV SOLN
500.0000 [IU] | Freq: Once | INTRAVENOUS | Status: AC | PRN
  Administered 2018-03-05: 500 [IU]
  Filled 2018-03-05: qty 5

## 2018-03-10 ENCOUNTER — Other Ambulatory Visit: Payer: Self-pay | Admitting: Oncology

## 2018-03-10 DIAGNOSIS — C50411 Malignant neoplasm of upper-outer quadrant of right female breast: Secondary | ICD-10-CM

## 2018-03-13 ENCOUNTER — Ambulatory Visit: Payer: 59 | Admitting: Internal Medicine

## 2018-03-13 ENCOUNTER — Encounter: Payer: Self-pay | Admitting: Internal Medicine

## 2018-03-13 VITALS — BP 118/78 | HR 62 | Ht 63.0 in | Wt 177.2 lb

## 2018-03-13 DIAGNOSIS — I471 Supraventricular tachycardia: Secondary | ICD-10-CM | POA: Diagnosis not present

## 2018-03-13 DIAGNOSIS — C801 Malignant (primary) neoplasm, unspecified: Secondary | ICD-10-CM

## 2018-03-13 DIAGNOSIS — I313 Pericardial effusion (noninflammatory): Principal | ICD-10-CM

## 2018-03-13 DIAGNOSIS — I318 Other specified diseases of pericardium: Secondary | ICD-10-CM | POA: Diagnosis not present

## 2018-03-13 DIAGNOSIS — I3131 Malignant pericardial effusion in diseases classified elsewhere: Secondary | ICD-10-CM

## 2018-03-13 NOTE — Patient Instructions (Signed)
Medication Instructions:  -  Your physician recommends that you continue on your current medications as directed. Please refer to the Current Medication list given to you today.  Labwork: - none ordered  Testing/Procedures: - none ordered  Follow-Up: - Your physician wants you to follow-up in: 1 year with Dr. Saunders Revel. You will receive a reminder letter in the mail two months in advance. If you don't receive a letter, please call our office to schedule the follow-up appointment.   Any Other Special Instructions Will Be Listed Below (If Applicable).     If you need a refill on your cardiac medications before your next appointment, please call your pharmacy.
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
## 2018-03-15 ENCOUNTER — Encounter: Payer: Self-pay | Admitting: Internal Medicine

## 2018-03-18 ENCOUNTER — Other Ambulatory Visit: Payer: Self-pay | Admitting: *Deleted

## 2018-03-18 MED ORDER — ALENDRONATE SODIUM 70 MG PO TABS: 70.0000 mg | ORAL_TABLET | ORAL | 1 refills | Status: DC

## 2018-03-23 NOTE — Progress Notes (Signed)
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   INTERVAL HISTORY: Patient returns to clinic today for further evaluation and consideration of cycle 16 of maintenance Herceptin.  She currently feels well and is asymptomatic.  She has no neurologic complaints.  She does not complain of any weakness or fatigue today.  She continues to be active and work full-time.  She denies any recent fevers or illnesses.  She has a fair appetite and has maintained her weight.  She has no chest pain, cough, or shortness of breath.  She denies any nausea, vomiting, constipation, or diarrhea.  She has no urinary complaints.  Patient offers no specific complaints today.    REVIEW OF SYSTEMS:   Review of Systems  Constitutional: Negative.  Negative for fever, malaise/fatigue and weight loss.  HENT: Negative.  Negative for congestion and hearing loss.   Respiratory: Negative.  Negative for cough and shortness of breath.   Cardiovascular: Negative.  Negative for chest pain and leg swelling.  Gastrointestinal: Negative.  Negative for abdominal pain, diarrhea and vomiting.  Genitourinary: Negative.  Negative for dysuria.  Musculoskeletal: Negative.  Negative for back pain and falls.  Skin: Negative.  Negative for itching and rash.  Neurological: Negative.  Negative for sensory change, focal weakness and weakness.  Psychiatric/Behavioral: Negative.  The patient is not nervous/anxious and does not have insomnia.    As per HPI. Otherwise, a complete review of systems is  negative.   PAST MEDICAL HISTORY:  Hypothyroidism, migraines, depression, asthma.  PAST SURGICAL HISTORY: Bilateral mastectomy with reconstruction, partial hysterectomy.  FAMILY HISTORY: Lung cancer, melanoma, stomach cancer.  Also diabetes, CAD, hypertension     ADVANCED DIRECTIVES:    HEALTH MAINTENANCE: Social History   Tobacco Use  . Smoking status: Never Smoker  . Smokeless tobacco: Never Used  Substance Use Topics  . Alcohol use: No  . Drug use: No     Colonoscopy:  PAP:  Bone density:  Lipid panel:  Allergies  Allergen Reactions  . Codeine Other (See Comments)    constipation  . Sulfa Antibiotics Other (See Comments)    Allergy as a child    Current Outpatient Medications  Medication Sig Dispense Refill  . alendronate (FOSAMAX) 70 MG tablet Take 1 tablet (70 mg total) by mouth once a week. Take with a full glass of water on an empty stomach. 12 tablet 1  . ALPRAZolam (XANAX) 0.25 MG tablet Take 1 tablet by mouth 3 (three) times daily as needed for anxiety.     Marland Kitchen azelastine (ASTELIN) 0.1 % nasal spray Place 1 spray into both nostrils daily as needed for rhinitis. Use in each nostril as directed    . calcium-vitamin D (OSCAL WITH D) 250-125 MG-UNIT tablet Take 1 tablet by mouth daily.    . Cholecalciferol (D3 HIGH POTENCY) 2000 units CAPS Take 1 capsule by mouth daily.    . cyclobenzaprine (FLEXERIL) 10 MG tablet Take 10 mg by mouth 3 (three) times daily as needed.     . DULoxetine (CYMBALTA) 30 MG capsule Take 90 mg by mouth every morning.     Marland Kitchen  fluticasone (FLONASE) 50 MCG/ACT nasal spray SPRAY TWICE IEN ONCE D  12  . fluticasone (FLOVENT HFA) 110 MCG/ACT inhaler Inhale 1 puff into the lungs 2 (two) times daily.    Marland Kitchen levothyroxine (SYNTHROID, LEVOTHROID) 137 MCG tablet Take by mouth daily before breakfast.     . lidocaine-prilocaine (EMLA) cream Apply to affected area once 30 g 3  . metoprolol succinate (TOPROL-XL) 25 MG 24 hr tablet Take 75 mg by mouth at  bedtime.     . Multiple Vitamin (MULTIVITAMIN) tablet Take 1 tablet by mouth daily.    . ondansetron (ZOFRAN) 8 MG tablet Take 1 tablet (8 mg total) by mouth 2 (two) times daily as needed (Nausea or vomiting). 60 tablet 3  . prochlorperazine (COMPAZINE) 10 MG tablet TAKE 1 TABLET(10 MG) BY MOUTH EVERY 6 HOURS AS NEEDED FOR NAUSEA OR VOMITING 60 tablet 0  . topiramate (TOPAMAX) 50 MG tablet Take 50 mg by mouth at bedtime.     . Triamcinolone Acetonide (TRIAMCINOLONE 0.1 % CREAM : EUCERIN) CREA Apply 1 application topically 2 (two) times daily as needed. 1 each 0  . phenazopyridine (PYRIDIUM) 100 MG tablet Take 1 tablet (100 mg total) by mouth 3 (three) times daily as needed for pain. (Patient not taking: Reported on 03/26/2018) 10 tablet 0  . traMADol (ULTRAM) 50 MG tablet Take 25-50 mg by mouth every 6 (six) hours as needed.      No current facility-administered medications for this visit.     OBJECTIVE: Vitals:   03/26/18 1003  BP: 93/64  Pulse: 74  Resp: 16  Temp: (!) 97.4 F (36.3 C)  SpO2: 99%     Body mass index is 31 kg/m.    ECOG FS:0 - Asymptomatic  General: Well-developed, well-nourished, no acute distress. Eyes: Pink conjunctiva, anicteric sclera. HEENT: Normocephalic, moist mucous membranes. Breast: Bilateral mastectomy with reconstruction. Lungs: Clear to auscultation bilaterally. Heart: Regular rate and rhythm. No rubs, murmurs, or gallops. Abdomen: Soft, nontender, nondistended. No organomegaly noted, normoactive bowel sounds. Musculoskeletal: No edema, cyanosis, or clubbing. Neuro: Alert, answering all questions appropriately. Cranial nerves grossly intact. Skin: No rashes or petechiae noted. Psych: Normal affect.  LAB RESULTS:  Lab Results  Component Value Date   NA 136 07/23/2017   K 3.6 07/23/2017   CL 107 07/23/2017   CO2 19 (L) 07/23/2017   GLUCOSE 117 (H) 07/23/2017   BUN 13 07/23/2017   CREATININE 0.60 09/25/2017   CALCIUM 9.6 07/23/2017   PROT 7.4  07/23/2017   ALBUMIN 4.0 07/23/2017   AST 26 07/23/2017   ALT 18 07/23/2017   ALKPHOS 68 07/23/2017   BILITOT 0.6 07/23/2017   GFRNONAA >60 07/23/2017   GFRAA >60 07/23/2017    Lab Results  Component Value Date   WBC 3.8 07/23/2017   NEUTROABS 2.8 07/23/2017   HGB 12.4 07/23/2017   HCT 36.3 07/23/2017   MCV 96.7 07/23/2017   PLT 331 07/23/2017     STUDIES: No results found.  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    PLAN:    1. 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  Brain MRI reveals continued improvement of patient's for known metastatic deposits.  Patient's CA-27.29 continues to be within normal limits.  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  Cardiac echo completed on January 30, 2018 revealed an estimated EF of 55 to 60%.  No pericardial effusion was reported.  Patient has a repeat echo scheduled for April 30, 2018.  Proceed with cycle 16 of 18 of maintenance Herceptin today.  Return to clinic in 3 weeks for consideration of cycle 17 and then in 6 weeks for further evaluation and consideration of cycle 18.  Will reimage again at the conclusion of her treatments.  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 2. Brain metastasis: Essentially resolved.  Patient has completed XRT.  MRI from September 25, 2017 reviewed  independently with continued improvement of known metastatic lesions.  CT scan of head done after patient's fall on February 11, 2018 reviewed independently and reported as above with no new or progressive disease.  No further imaging is necessary unless there is suspicion of recurrence.   3. Malignant pericardial fluid: Resolved.  Cardiac echo as above. Continue follow-up with cardiology as indicated. 4.  Erythema/rash: Resolved. Unclear etiology, possibly related to XRT.   I spent a total of 30 minutes face-to-face with the patient of which greater than 50% of the visit was spent in counseling and coordination of care as detailed above.   Patient expressed understanding and was in agreement with this plan. She also understands that She can call clinic at any time with any questions, concerns, or complaints.    Breast cancer   Staging form: Breast, AJCC 7th Edition     Clinical stage from 02/14/2015: Stage IA (T1c, N0, M0) - Signed by Lloyd Huger, MD on 02/14/2015   Lloyd Huger, MD   03/30/2018 7:02 AM

## 2018-03-24 DIAGNOSIS — C50411 Malignant neoplasm of upper-outer quadrant of right female breast: Secondary | ICD-10-CM | POA: Diagnosis not present

## 2018-03-26 ENCOUNTER — Inpatient Hospital Stay: Payer: 59 | Attending: Oncology | Admitting: Oncology

## 2018-03-26 ENCOUNTER — Inpatient Hospital Stay: Payer: 59

## 2018-03-26 ENCOUNTER — Encounter: Payer: Self-pay | Admitting: Oncology

## 2018-03-26 VITALS — BP 93/64 | HR 74 | Temp 97.4°F | Resp 16 | Wt 175.0 lb

## 2018-03-26 DIAGNOSIS — C7931 Secondary malignant neoplasm of brain: Secondary | ICD-10-CM

## 2018-03-26 DIAGNOSIS — Z5112 Encounter for antineoplastic immunotherapy: Secondary | ICD-10-CM | POA: Insufficient documentation

## 2018-03-26 DIAGNOSIS — C50411 Malignant neoplasm of upper-outer quadrant of right female breast: Secondary | ICD-10-CM

## 2018-03-26 DIAGNOSIS — C50919 Malignant neoplasm of unspecified site of unspecified female breast: Secondary | ICD-10-CM

## 2018-03-26 DIAGNOSIS — Z171 Estrogen receptor negative status [ER-]: Secondary | ICD-10-CM | POA: Diagnosis not present

## 2018-03-26 MED ORDER — SODIUM CHLORIDE 0.9 % IV SOLN
Freq: Once | INTRAVENOUS | Status: AC
  Administered 2018-03-26: 11:00:00 via INTRAVENOUS
  Filled 2018-03-26: qty 1000

## 2018-03-26 MED ORDER — TRASTUZUMAB CHEMO 150 MG IV SOLR
6.0000 mg/kg | Freq: Once | INTRAVENOUS | Status: AC
  Administered 2018-03-26: 525 mg via INTRAVENOUS
  Filled 2018-03-26: qty 25

## 2018-03-26 MED ORDER — SODIUM CHLORIDE 0.9% FLUSH
10.0000 mL | INTRAVENOUS | Status: DC | PRN
  Administered 2018-03-26: 10 mL
  Filled 2018-03-26: qty 10

## 2018-03-26 MED ORDER — HEPARIN SOD (PORK) LOCK FLUSH 100 UNIT/ML IV SOLN
500.0000 [IU] | Freq: Once | INTRAVENOUS | Status: AC | PRN
  Administered 2018-03-26: 500 [IU]
  Filled 2018-03-26: qty 5

## 2018-03-26 MED ORDER — ACETAMINOPHEN 325 MG PO TABS
650.0000 mg | ORAL_TABLET | Freq: Once | ORAL | Status: AC
  Administered 2018-03-26: 650 mg via ORAL
  Filled 2018-03-26: qty 2

## 2018-03-26 MED ORDER — DIPHENHYDRAMINE HCL 25 MG PO CAPS
25.0000 mg | ORAL_CAPSULE | Freq: Once | ORAL | Status: AC
  Administered 2018-03-26: 25 mg via ORAL
  Filled 2018-03-26: qty 1

## 2018-04-01 ENCOUNTER — Ambulatory Visit: Payer: 59 | Attending: Oncology | Admitting: Occupational Therapy

## 2018-04-01 ENCOUNTER — Other Ambulatory Visit: Payer: Self-pay

## 2018-04-01 ENCOUNTER — Encounter: Payer: Self-pay | Admitting: Occupational Therapy

## 2018-04-01 DIAGNOSIS — I972 Postmastectomy lymphedema syndrome: Secondary | ICD-10-CM | POA: Insufficient documentation

## 2018-04-01 NOTE — Patient Instructions (Signed)
L UE bandage - keep on - precautions ed on - pt verbalize understanding  Pt to do AROM to L arm - 2-3 x day or when bandage tight Fisting Wrist and elbow flexion and extention  Shoulder D1 and D2 patterns  And thenPect stretches   Scapula squeezes Shoulder extention  2 x day at least
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
## 2018-04-02 ENCOUNTER — Encounter: Payer: Self-pay | Admitting: Oncology

## 2018-04-04 ENCOUNTER — Ambulatory Visit: Payer: 59 | Admitting: Occupational Therapy

## 2018-04-04 DIAGNOSIS — I972 Postmastectomy lymphedema syndrome: Secondary | ICD-10-CM | POA: Diagnosis not present

## 2018-04-04 NOTE — Patient Instructions (Signed)
Same as last time  
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
## 2018-04-07 ENCOUNTER — Ambulatory Visit: Payer: 59 | Admitting: Occupational Therapy

## 2018-04-07 DIAGNOSIS — I972 Postmastectomy lymphedema syndrome: Secondary | ICD-10-CM

## 2018-04-07 NOTE — Patient Instructions (Signed)
Same as last
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
## 2018-04-09 ENCOUNTER — Ambulatory Visit: Payer: 59 | Admitting: Occupational Therapy

## 2018-04-09 DIAGNOSIS — I972 Postmastectomy lymphedema syndrome: Secondary | ICD-10-CM | POA: Diagnosis not present
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
## 2018-04-09 NOTE — Patient Instructions (Signed)
Pt to take off bandages just prior to appt Friday for measurements - take shower  And then after garments measurements - come and see me to do bandages

## 2018-04-11 ENCOUNTER — Ambulatory Visit: Payer: 59 | Admitting: Occupational Therapy

## 2018-04-11 DIAGNOSIS — E7849 Other hyperlipidemia: Secondary | ICD-10-CM | POA: Diagnosis not present

## 2018-04-11 DIAGNOSIS — I972 Postmastectomy lymphedema syndrome: Secondary | ICD-10-CM

## 2018-04-11 DIAGNOSIS — R3129 Other microscopic hematuria: Secondary | ICD-10-CM | POA: Diagnosis not present

## 2018-04-11 DIAGNOSIS — E034 Atrophy of thyroid (acquired): Secondary | ICD-10-CM | POA: Diagnosis not present

## 2018-04-11 NOTE — Patient Instructions (Signed)
Same
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
## 2018-04-14 ENCOUNTER — Other Ambulatory Visit: Payer: Self-pay | Admitting: Oncology

## 2018-04-15 ENCOUNTER — Ambulatory Visit: Payer: 59 | Attending: Oncology | Admitting: Occupational Therapy

## 2018-04-15 DIAGNOSIS — I972 Postmastectomy lymphedema syndrome: Secondary | ICD-10-CM | POA: Insufficient documentation
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
## 2018-04-15 NOTE — Patient Instructions (Signed)
same

## 2018-04-16 ENCOUNTER — Inpatient Hospital Stay: Payer: 59 | Attending: Oncology

## 2018-04-16 VITALS — BP 96/68 | HR 63 | Temp 97.5°F | Resp 16 | Wt 176.6 lb

## 2018-04-16 DIAGNOSIS — C50411 Malignant neoplasm of upper-outer quadrant of right female breast: Secondary | ICD-10-CM | POA: Diagnosis not present

## 2018-04-16 DIAGNOSIS — C7931 Secondary malignant neoplasm of brain: Secondary | ICD-10-CM | POA: Insufficient documentation

## 2018-04-16 DIAGNOSIS — Z79899 Other long term (current) drug therapy: Secondary | ICD-10-CM | POA: Insufficient documentation

## 2018-04-16 DIAGNOSIS — I890 Lymphedema, not elsewhere classified: Secondary | ICD-10-CM | POA: Insufficient documentation

## 2018-04-16 DIAGNOSIS — Z5112 Encounter for antineoplastic immunotherapy: Secondary | ICD-10-CM | POA: Insufficient documentation

## 2018-04-16 MED ORDER — DIPHENHYDRAMINE HCL 25 MG PO CAPS
25.0000 mg | ORAL_CAPSULE | Freq: Once | ORAL | Status: AC
  Administered 2018-04-16: 25 mg via ORAL
  Filled 2018-04-16: qty 1

## 2018-04-16 MED ORDER — HEPARIN SOD (PORK) LOCK FLUSH 100 UNIT/ML IV SOLN
500.0000 [IU] | Freq: Once | INTRAVENOUS | Status: AC | PRN
  Administered 2018-04-16: 500 [IU]
  Filled 2018-04-16: qty 5

## 2018-04-16 MED ORDER — SODIUM CHLORIDE 0.9% FLUSH
10.0000 mL | INTRAVENOUS | Status: DC | PRN
  Administered 2018-04-16: 10 mL
  Filled 2018-04-16: qty 10

## 2018-04-16 MED ORDER — SODIUM CHLORIDE 0.9 % IV SOLN
Freq: Once | INTRAVENOUS | Status: AC
  Administered 2018-04-16: 10:00:00 via INTRAVENOUS
  Filled 2018-04-16: qty 250

## 2018-04-16 MED ORDER — ACETAMINOPHEN 325 MG PO TABS
650.0000 mg | ORAL_TABLET | Freq: Once | ORAL | Status: AC
  Administered 2018-04-16: 650 mg via ORAL
  Filled 2018-04-16: qty 2

## 2018-04-16 MED ORDER — TRASTUZUMAB CHEMO 150 MG IV SOLR
6.0000 mg/kg | Freq: Once | INTRAVENOUS | Status: AC
  Administered 2018-04-16: 525 mg via INTRAVENOUS
  Filled 2018-04-16: qty 25

## 2018-04-18 ENCOUNTER — Ambulatory Visit: Payer: 59 | Admitting: Occupational Therapy

## 2018-04-18 DIAGNOSIS — I972 Postmastectomy lymphedema syndrome: Secondary | ICD-10-CM | POA: Diagnosis not present
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
## 2018-04-21 ENCOUNTER — Ambulatory Visit: Payer: 59 | Admitting: Occupational Therapy

## 2018-04-21 DIAGNOSIS — I972 Postmastectomy lymphedema syndrome: Secondary | ICD-10-CM

## 2018-04-21 NOTE — Patient Instructions (Signed)
Same
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
## 2018-04-23 ENCOUNTER — Inpatient Hospital Stay: Payer: 59

## 2018-04-23 DIAGNOSIS — M5136 Other intervertebral disc degeneration, lumbar region: Secondary | ICD-10-CM | POA: Diagnosis not present

## 2018-04-23 DIAGNOSIS — I471 Supraventricular tachycardia: Secondary | ICD-10-CM | POA: Diagnosis not present

## 2018-04-23 DIAGNOSIS — E034 Atrophy of thyroid (acquired): Secondary | ICD-10-CM | POA: Diagnosis not present

## 2018-04-24 ENCOUNTER — Ambulatory Visit: Payer: 59 | Admitting: Occupational Therapy

## 2018-04-24 DIAGNOSIS — I972 Postmastectomy lymphedema syndrome: Secondary | ICD-10-CM | POA: Diagnosis not present

## 2018-04-24 NOTE — Patient Instructions (Signed)
Same
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
## 2018-04-25 ENCOUNTER — Ambulatory Visit: Payer: 59 | Admitting: Occupational Therapy

## 2018-04-25 DIAGNOSIS — I972 Postmastectomy lymphedema syndrome: Secondary | ICD-10-CM | POA: Diagnosis not present

## 2018-04-25 NOTE — Patient Instructions (Signed)
Same
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
## 2018-04-28 ENCOUNTER — Ambulatory Visit: Payer: 59 | Admitting: Occupational Therapy

## 2018-04-28 DIAGNOSIS — I972 Postmastectomy lymphedema syndrome: Secondary | ICD-10-CM | POA: Diagnosis not present
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
## 2018-04-28 NOTE — Patient Instructions (Signed)
Same

## 2018-04-30 ENCOUNTER — Ambulatory Visit
Admission: RE | Admit: 2018-04-30 | Discharge: 2018-04-30 | Disposition: A | Discharge status: Home / Self Care | Payer: 59 | Source: Ambulatory Visit | Attending: Oncology | Admitting: Oncology

## 2018-04-30 DIAGNOSIS — C50411 Malignant neoplasm of upper-outer quadrant of right female breast: Secondary | ICD-10-CM | POA: Diagnosis not present

## 2018-04-30 DIAGNOSIS — E039 Hypothyroidism, unspecified: Secondary | ICD-10-CM | POA: Insufficient documentation

## 2018-04-30 DIAGNOSIS — F419 Anxiety disorder, unspecified: Secondary | ICD-10-CM | POA: Insufficient documentation

## 2018-04-30 MED ORDER — PERFLUTREN LIPID MICROSPHERE
1.0000 mL | INTRAVENOUS | Status: AC | PRN
  Administered 2018-04-30: 2 mL via INTRAVENOUS
  Filled 2018-04-30: qty 10

## 2018-04-30 NOTE — Progress Notes (Signed)
*  PRELIMINARY RESULTS* Echocardiogram 2D Echocardiogram has been performed.  Sherrie Sport 04/30/2018, 12:00 PM

## 2018-05-01 ENCOUNTER — Ambulatory Visit: Payer: 59 | Admitting: Occupational Therapy

## 2018-05-01 DIAGNOSIS — I972 Postmastectomy lymphedema syndrome: Secondary | ICD-10-CM

## 2018-05-01 NOTE — Patient Instructions (Signed)
Same
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
## 2018-05-02 ENCOUNTER — Ambulatory Visit: Payer: 59 | Admitting: Occupational Therapy

## 2018-05-02 DIAGNOSIS — I972 Postmastectomy lymphedema syndrome: Secondary | ICD-10-CM
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
## 2018-05-02 NOTE — Patient Instructions (Signed)
Same

## 2018-05-04 NOTE — Progress Notes (Signed)
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   INTERVAL HISTORY: Patient returns to clinic today for further evaluation and consideration of cycle 18 of maintenance Herceptin.  She continues to feel well and remains asymptomatic.  Her left arm is in a lymphedema wrap.  She continues to be active and work full-time. She has no neurologic complaints.  She does not complain of any weakness or fatigue today. She denies any recent fevers or illnesses.  She has a fair appetite and has maintained her weight.  She has no chest pain, cough, or shortness of breath.  She denies any nausea, vomiting, constipation, or diarrhea.  She has no urinary complaints.  Patient offers no specific complaints today.  REVIEW OF SYSTEMS:   Review of Systems  Constitutional: Negative.  Negative for fever, malaise/fatigue and weight loss.  HENT: Negative.  Negative for congestion and hearing loss.   Respiratory: Negative.  Negative for cough and shortness of breath.   Cardiovascular: Negative.  Negative for chest pain and leg swelling.  Gastrointestinal: Negative.  Negative for abdominal pain, diarrhea and vomiting.  Genitourinary: Negative.  Negative for dysuria.  Musculoskeletal: Negative.  Negative for back pain and falls.  Skin: Negative.  Negative for itching and rash.  Neurological: Negative.  Negative for sensory change, focal weakness and weakness.  Psychiatric/Behavioral: Negative.  The patient is not nervous/anxious and does not have insomnia.    As per HPI.  Otherwise, a complete review of systems is negative.   PAST MEDICAL HISTORY:  Hypothyroidism, migraines, depression, asthma.  PAST SURGICAL HISTORY: Bilateral mastectomy with reconstruction, partial hysterectomy.  FAMILY HISTORY: Lung cancer, melanoma, stomach cancer.  Also diabetes, CAD, hypertension     ADVANCED DIRECTIVES:    HEALTH MAINTENANCE: Social History   Tobacco Use  . Smoking status: Never Smoker  . Smokeless tobacco: Never Used  Substance Use Topics  . Alcohol use: No  . Drug use: No     Colonoscopy:  PAP:  Bone density:  Lipid panel:  Allergies  Allergen Reactions  . Codeine Other (See Comments)    constipation  . Sulfa Antibiotics Other (See Comments)    Allergy as a child    Current Outpatient Medications  Medication Sig Dispense Refill  . alendronate (FOSAMAX) 70 MG tablet Take 1 tablet (70 mg total) by mouth once a week. Take with a full glass of water on an empty stomach. 12 tablet 1  . ALPRAZolam (XANAX) 0.25 MG tablet Take 1 tablet by mouth 3 (three) times daily as needed for anxiety.     Marland Kitchen azelastine (ASTELIN) 0.1 % nasal spray Place 1 spray into both nostrils daily as needed for rhinitis. Use in each nostril as directed    . calcium-vitamin D (OSCAL WITH D) 250-125 MG-UNIT tablet Take 1 tablet by mouth daily.    . Cholecalciferol (D3 HIGH POTENCY) 2000 units CAPS Take 1 capsule by mouth daily.    . cyclobenzaprine (FLEXERIL) 10 MG tablet Take 10 mg by mouth 3 (three) times daily as needed.     . DULoxetine (CYMBALTA) 30 MG capsule Take 90 mg by  mouth every morning.     . fluticasone (FLONASE) 50 MCG/ACT nasal spray SPRAY TWICE IEN ONCE D  12  . fluticasone (FLOVENT HFA) 110 MCG/ACT inhaler Inhale 1 puff into the lungs 2 (two) times daily.    Marland Kitchen levothyroxine (SYNTHROID, LEVOTHROID) 137 MCG tablet Take by mouth daily before breakfast.     . lidocaine-prilocaine (EMLA) cream Apply to affected area once 30 g 3  . metoprolol succinate (TOPROL-XL) 25  MG 24 hr tablet Take 75 mg by mouth at bedtime.     . Multiple Vitamin (MULTIVITAMIN) tablet Take 1 tablet by mouth daily.    Marland Kitchen topiramate (TOPAMAX) 50 MG tablet Take 50 mg by mouth at bedtime.     . traMADol (ULTRAM) 50 MG tablet Take 25-50 mg by mouth every 6 (six) hours as needed.     . Triamcinolone Acetonide (TRIAMCINOLONE 0.1 % CREAM : EUCERIN) CREA Apply 1 application topically 2 (two) times daily as needed. 1 each 0  . triamcinolone ointment (KENALOG) 0.5 % APPLY EXTERNALLY TO THE AFFECTED AREA TWICE DAILY 30 g 0  . ondansetron (ZOFRAN) 8 MG tablet Take 1 tablet (8 mg total) by mouth 2 (two) times daily as needed (Nausea or vomiting). (Patient not taking: Reported on 05/07/2018) 60 tablet 3  . phenazopyridine (PYRIDIUM) 100 MG tablet Take 1 tablet (100 mg total) by mouth 3 (three) times daily as needed for pain. (Patient not taking: Reported on 05/07/2018) 10 tablet 0  . prochlorperazine (COMPAZINE) 10 MG tablet TAKE 1 TABLET(10 MG) BY MOUTH EVERY 6 HOURS AS NEEDED FOR NAUSEA OR VOMITING (Patient not taking: Reported on 05/07/2018) 60 tablet 0   No current facility-administered medications for this visit.     OBJECTIVE: Vitals:   05/07/18 1007  BP: 103/90  Pulse: 76  Resp: 18  Temp: (!) 95.6 F (35.3 C)     Body mass index is 30.45 kg/m.    ECOG FS:0 - Asymptomatic  General: Well-developed, well-nourished, no acute distress. Eyes: Pink conjunctiva, anicteric sclera. HEENT: Normocephalic, moist mucous membranes. Breast: Bilateral mastectomy with reconstruction.  Exam deferred today. Lungs: Clear to auscultation bilaterally. Heart: Regular rate and rhythm. No rubs, murmurs, or gallops. Abdomen: Soft, nontender, nondistended. No organomegaly noted, normoactive bowel sounds. Musculoskeletal: No edema, cyanosis, or clubbing. Neuro: Alert, answering all questions appropriately. Cranial nerves grossly intact. Skin: No rashes or petechiae noted. Psych: Normal affect.  LAB  RESULTS:  Lab Results  Component Value Date   NA 136 07/23/2017   K 3.6 07/23/2017   CL 107 07/23/2017   CO2 19 (L) 07/23/2017   GLUCOSE 117 (H) 07/23/2017   BUN 13 07/23/2017   CREATININE 0.60 09/25/2017   CALCIUM 9.6 07/23/2017   PROT 7.4 07/23/2017   ALBUMIN 4.0 07/23/2017   AST 26 07/23/2017   ALT 18 07/23/2017   ALKPHOS 68 07/23/2017   BILITOT 0.6 07/23/2017   GFRNONAA >60 07/23/2017   GFRAA >60 07/23/2017    Lab Results  Component Value Date   WBC 3.8 07/23/2017   NEUTROABS 2.8 07/23/2017   HGB 12.4 07/23/2017   HCT 36.3 07/23/2017   MCV 96.7 07/23/2017   PLT 331 07/23/2017     STUDIES: No results found.  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    PLAN:    1. 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  Brain MRI revealed continued improvement of patient's for known metastatic deposits.  Patient's CA-27.29 continues to be within normal limits.  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  Cardiac echo on April 30, 2018 revealed an estimated EF of 55% with no pericardial effusion reported.  Proceed with cycle 18 of 18 of maintenance Herceptin today.  Return to clinic in approximately 1 month with repeat imaging and further evaluation. 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 2. Brain metastasis: Essentially  resolved.  Patient has completed XRT.  MRI from September 25, 2017 reviewed independently with continued improvement of known metastatic lesions.  CT scan of head done after patient's fall on February 11, 2018 reviewed independently and reported as above with no new or progressive disease.  No further imaging is necessary unless there is suspicion of recurrence.   3. Malignant pericardial fluid: Resolved.  Cardiac echo as above. Continue follow-up with cardiology as indicated. 4.  Lymphedema: Continue follow-up in lymphedema clinic and treatment as needed.  I spent a total of 30 minutes face-to-face with the patient of which greater than 50% of the visit was spent in counseling and coordination of care as detailed above.   Patient expressed understanding and was in agreement with this plan. She also understands that She can call clinic at any time with any questions, concerns, or complaints.    Breast cancer   Staging form: Breast, AJCC 7th Edition     Clinical stage from 02/14/2015: Stage IA (T1c, N0, M0) - Signed by Lloyd Huger, MD on 02/14/2015   Lloyd Huger, MD   05/11/2018 7:18 AM

## 2018-05-05 ENCOUNTER — Ambulatory Visit: Payer: 59 | Admitting: Occupational Therapy

## 2018-05-05 DIAGNOSIS — I972 Postmastectomy lymphedema syndrome: Secondary | ICD-10-CM

## 2018-05-05 DIAGNOSIS — C50411 Malignant neoplasm of upper-outer quadrant of right female breast: Secondary | ICD-10-CM | POA: Diagnosis not present
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
## 2018-05-05 NOTE — Patient Instructions (Signed)
same

## 2018-05-06 ENCOUNTER — Encounter: Payer: Self-pay | Admitting: Oncology

## 2018-05-07 ENCOUNTER — Inpatient Hospital Stay (HOSPITAL_BASED_OUTPATIENT_CLINIC_OR_DEPARTMENT_OTHER): Payer: 59 | Admitting: Oncology

## 2018-05-07 ENCOUNTER — Inpatient Hospital Stay: Payer: 59

## 2018-05-07 ENCOUNTER — Other Ambulatory Visit: Payer: Self-pay

## 2018-05-07 DIAGNOSIS — C50411 Malignant neoplasm of upper-outer quadrant of right female breast: Secondary | ICD-10-CM

## 2018-05-07 DIAGNOSIS — Z5112 Encounter for antineoplastic immunotherapy: Secondary | ICD-10-CM | POA: Diagnosis not present

## 2018-05-07 DIAGNOSIS — I890 Lymphedema, not elsewhere classified: Secondary | ICD-10-CM | POA: Diagnosis not present

## 2018-05-07 DIAGNOSIS — C7931 Secondary malignant neoplasm of brain: Secondary | ICD-10-CM

## 2018-05-07 MED ORDER — DIPHENHYDRAMINE HCL 25 MG PO CAPS
25.0000 mg | ORAL_CAPSULE | Freq: Once | ORAL | Status: AC
  Administered 2018-05-07: 25 mg via ORAL
  Filled 2018-05-07: qty 1

## 2018-05-07 MED ORDER — SODIUM CHLORIDE 0.9 % IV SOLN
Freq: Once | INTRAVENOUS | Status: AC
  Administered 2018-05-07: 11:00:00 via INTRAVENOUS
  Filled 2018-05-07: qty 250

## 2018-05-07 MED ORDER — ACETAMINOPHEN 325 MG PO TABS
650.0000 mg | ORAL_TABLET | Freq: Once | ORAL | Status: AC
  Administered 2018-05-07: 650 mg via ORAL
  Filled 2018-05-07: qty 2

## 2018-05-07 MED ORDER — HEPARIN SOD (PORK) LOCK FLUSH 100 UNIT/ML IV SOLN
500.0000 [IU] | Freq: Once | INTRAVENOUS | Status: AC
  Administered 2018-05-07: 500 [IU] via INTRAVENOUS

## 2018-05-07 MED ORDER — SODIUM CHLORIDE 0.9% FLUSH
10.0000 mL | INTRAVENOUS | Status: DC | PRN
  Administered 2018-05-07: 10 mL via INTRAVENOUS
  Filled 2018-05-07: qty 10

## 2018-05-07 MED ORDER — TRASTUZUMAB CHEMO 150 MG IV SOLR
6.0000 mg/kg | Freq: Once | INTRAVENOUS | Status: AC
  Administered 2018-05-07: 525 mg via INTRAVENOUS
  Filled 2018-05-07: qty 25

## 2018-05-07 NOTE — Progress Notes (Signed)
Here for follow up. Lymphedema of L arm continues-wrapped w sling and awaiting sleeve -shou;d arrive this week she stated.

## 2018-05-07 NOTE — Progress Notes (Signed)
Weight has decreased. Spoke with Dr Woodfin Ganja, MD would like to keep current dose of herceptin of 525mg 

## 2018-05-08 ENCOUNTER — Ambulatory Visit: Payer: 59 | Admitting: Occupational Therapy

## 2018-05-08 DIAGNOSIS — I972 Postmastectomy lymphedema syndrome: Secondary | ICD-10-CM | POA: Diagnosis not present

## 2018-05-08 NOTE — Patient Instructions (Signed)
Same
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
## 2018-05-09 ENCOUNTER — Ambulatory Visit: Payer: 59 | Admitting: Occupational Therapy

## 2018-05-09 DIAGNOSIS — I972 Postmastectomy lymphedema syndrome: Secondary | ICD-10-CM | POA: Diagnosis not present
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
## 2018-05-12 ENCOUNTER — Ambulatory Visit: Payer: 59 | Admitting: Occupational Therapy

## 2018-05-12 DIAGNOSIS — I972 Postmastectomy lymphedema syndrome: Secondary | ICD-10-CM

## 2018-05-12 NOTE — Patient Instructions (Signed)
same
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
## 2018-05-15 ENCOUNTER — Ambulatory Visit: Payer: 59 | Attending: Oncology | Admitting: Occupational Therapy

## 2018-05-15 DIAGNOSIS — I972 Postmastectomy lymphedema syndrome: Secondary | ICD-10-CM | POA: Diagnosis present
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
## 2018-05-16 ENCOUNTER — Ambulatory Visit: Payer: 59 | Admitting: Occupational Therapy

## 2018-05-19 ENCOUNTER — Ambulatory Visit: Payer: 59 | Admitting: Occupational Therapy

## 2018-05-19 DIAGNOSIS — I972 Postmastectomy lymphedema syndrome: Secondary | ICD-10-CM
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
## 2018-05-19 NOTE — Patient Instructions (Signed)
Cont to wear daysleeve and Isotoner glove during day And night sleeve at night time  To contact DME company to setup re measurements for glove and order Juzo slippy gator

## 2018-05-23 ENCOUNTER — Ambulatory Visit: Payer: 59 | Admitting: Occupational Therapy

## 2018-05-30 ENCOUNTER — Ambulatory Visit: Payer: 59 | Admitting: Occupational Therapy

## 2018-05-30 DIAGNOSIS — I972 Postmastectomy lymphedema syndrome: Secondary | ICD-10-CM

## 2018-05-30 NOTE — Patient Instructions (Signed)
Cont with compression - and await correct compression glove -to call OT when arrive
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
## 2018-06-06 DIAGNOSIS — J0100 Acute maxillary sinusitis, unspecified: Secondary | ICD-10-CM | POA: Diagnosis not present

## 2018-06-06 DIAGNOSIS — H6981 Other specified disorders of Eustachian tube, right ear: Secondary | ICD-10-CM | POA: Diagnosis not present

## 2018-06-06 DIAGNOSIS — R599 Enlarged lymph nodes, unspecified: Secondary | ICD-10-CM | POA: Diagnosis not present

## 2018-06-09 ENCOUNTER — Ambulatory Visit
Admission: RE | Admit: 2018-06-09 | Discharge: 2018-06-09 | Disposition: A | Discharge status: Home / Self Care | Payer: 59 | Source: Ambulatory Visit | Attending: Oncology | Admitting: Oncology

## 2018-06-09 ENCOUNTER — Other Ambulatory Visit: Payer: 59

## 2018-06-09 DIAGNOSIS — C779 Secondary and unspecified malignant neoplasm of lymph node, unspecified: Secondary | ICD-10-CM | POA: Diagnosis not present

## 2018-06-09 DIAGNOSIS — R911 Solitary pulmonary nodule: Secondary | ICD-10-CM | POA: Insufficient documentation

## 2018-06-09 DIAGNOSIS — I700 Atherosclerosis of aorta: Secondary | ICD-10-CM | POA: Diagnosis not present

## 2018-06-09 DIAGNOSIS — R599 Enlarged lymph nodes, unspecified: Secondary | ICD-10-CM | POA: Diagnosis not present

## 2018-06-09 DIAGNOSIS — N200 Calculus of kidney: Secondary | ICD-10-CM | POA: Diagnosis not present

## 2018-06-09 DIAGNOSIS — C50911 Malignant neoplasm of unspecified site of right female breast: Secondary | ICD-10-CM | POA: Diagnosis not present

## 2018-06-09 DIAGNOSIS — C50411 Malignant neoplasm of upper-outer quadrant of right female breast: Secondary | ICD-10-CM

## 2018-06-09 LAB — POCT I-STAT CREATININE: Creatinine, Ser: 0.7 mg/dL (ref 0.44–1.00)

## 2018-06-09 MED ORDER — IOPAMIDOL (ISOVUE-300) INJECTION 61%
80.0000 mL | Freq: Once | INTRAVENOUS | Status: AC | PRN
  Administered 2018-06-09: 80 mL via INTRAVENOUS

## 2018-06-09 NOTE — Progress Notes (Signed)
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   INTERVAL HISTORY: Patient returns to clinic today for further evaluation and discussion of her restaging imaging results.  She continues to feel well and remains asymptomatic. Her left arm is in a lymphedema wrap.  She continues to be active and work full-time. She has no neurologic complaints.  She does not complain of any weakness or fatigue today. She denies any recent fevers or illnesses.  She has a fair appetite and has maintained her weight.  She has no chest pain, cough, or shortness of breath.  She denies any nausea, vomiting, constipation, or diarrhea.  She has no urinary complaints.  Patient feels that her baseline offers no specific complaints today.  REVIEW OF SYSTEMS:   Review of Systems  Constitutional: Negative.  Negative for fever, malaise/fatigue and weight loss.  HENT: Negative.  Negative for congestion and hearing loss.   Respiratory: Negative.  Negative for cough and shortness of breath.   Cardiovascular: Negative.  Negative for chest pain and leg swelling.  Gastrointestinal: Negative.  Negative for abdominal pain, diarrhea and vomiting.  Genitourinary: Negative.  Negative for dysuria.  Musculoskeletal: Negative.  Negative for back pain and falls.  Skin: Negative.  Negative for itching and rash.  Neurological: Negative.  Negative for sensory change, focal weakness and weakness.  Psychiatric/Behavioral: Negative.  The patient is not nervous/anxious and does not have insomnia.    As  per HPI. Otherwise, a complete review of systems is negative.   PAST MEDICAL HISTORY:  Hypothyroidism, migraines, depression, asthma.  PAST SURGICAL HISTORY: Bilateral mastectomy with reconstruction, partial hysterectomy.  FAMILY HISTORY: Lung cancer, melanoma, stomach cancer.  Also diabetes, CAD, hypertension     ADVANCED DIRECTIVES:    HEALTH MAINTENANCE: Social History   Tobacco Use  . Smoking status: Never Smoker  . Smokeless tobacco: Never Used  Substance Use Topics  . Alcohol use: No  . Drug use: No     Colonoscopy:  PAP:  Bone density:  Lipid panel:  Allergies  Allergen Reactions  . Codeine Other (See Comments)    constipation  . Sulfa Antibiotics Other (See Comments)    Allergy as a child    Current Outpatient Medications  Medication Sig Dispense Refill  . alendronate (FOSAMAX) 70 MG tablet Take 1 tablet (70 mg total) by mouth once a week. Take with a full glass of water on an empty stomach. 12 tablet 1  . ALPRAZolam (XANAX) 0.25 MG tablet Take 1 tablet by mouth 3 (three) times daily as needed for anxiety.     Marland Kitchen amoxicillin-clavulanate (AUGMENTIN) 875-125 MG tablet TK 1 T PO BID FOR 10 DAYS  0  . azelastine (ASTELIN) 0.1 % nasal spray Place 1 spray into both nostrils daily as needed for rhinitis. Use in each nostril as directed    . calcium-vitamin D (OSCAL WITH D) 250-125 MG-UNIT tablet Take 1 tablet by mouth daily.    . Cholecalciferol (D3 HIGH POTENCY) 2000 units CAPS Take 1 capsule by mouth daily.    . cyclobenzaprine (FLEXERIL) 10 MG tablet Take 10 mg by mouth 3 (  three) times daily as needed.     . DULoxetine (CYMBALTA) 30 MG capsule Take 90 mg by mouth every morning.     . fluticasone (FLONASE) 50 MCG/ACT nasal spray SPRAY TWICE IEN ONCE D  12  . fluticasone (FLOVENT HFA) 110 MCG/ACT inhaler Inhale 1 puff into the lungs 2 (two) times daily.    Marland Kitchen levothyroxine (SYNTHROID, LEVOTHROID) 137 MCG tablet Take by mouth daily before breakfast.     .  lidocaine-prilocaine (EMLA) cream Apply to affected area once 30 g 3  . metoprolol succinate (TOPROL-XL) 25 MG 24 hr tablet Take 75 mg by mouth at bedtime.     . Multiple Vitamin (MULTIVITAMIN) tablet Take 1 tablet by mouth daily.    . ondansetron (ZOFRAN) 8 MG tablet Take 1 tablet (8 mg total) by mouth 2 (two) times daily as needed (Nausea or vomiting). 60 tablet 3  . phenazopyridine (PYRIDIUM) 100 MG tablet Take 1 tablet (100 mg total) by mouth 3 (three) times daily as needed for pain. 10 tablet 0  . topiramate (TOPAMAX) 50 MG tablet Take 50 mg by mouth at bedtime.     . traMADol (ULTRAM) 50 MG tablet Take 25-50 mg by mouth every 6 (six) hours as needed.     . Triamcinolone Acetonide (TRIAMCINOLONE 0.1 % CREAM : EUCERIN) CREA Apply 1 application topically 2 (two) times daily as needed. 1 each 0  . triamcinolone ointment (KENALOG) 0.5 % APPLY EXTERNALLY TO THE AFFECTED AREA TWICE DAILY 30 g 0  . prochlorperazine (COMPAZINE) 10 MG tablet TAKE 1 TABLET(10 MG) BY MOUTH EVERY 6 HOURS AS NEEDED FOR NAUSEA OR VOMITING (Patient not taking: Reported on 05/07/2018) 60 tablet 0   No current facility-administered medications for this visit.     OBJECTIVE: Vitals:   06/12/18 1042  BP: 109/73  Pulse: 76  Resp: 18  Temp: 97.6 F (36.4 C)     Body mass index is 31.18 kg/m.    ECOG FS:0 - Asymptomatic  General: Well-developed, well-nourished, no acute distress. Eyes: Pink conjunctiva, anicteric sclera. HEENT: Normocephalic, moist mucous membranes. Lungs: Clear to auscultation bilaterally. Heart: Regular rate and rhythm. No rubs, murmurs, or gallops. Abdomen: Soft, nontender, nondistended. No organomegaly noted, normoactive bowel sounds. Musculoskeletal: No edema, cyanosis, or clubbing. Neuro: Alert, answering all questions appropriately. Cranial nerves grossly intact. Skin: No rashes or petechiae noted. Psych: Normal affect.  LAB RESULTS:  Lab Results  Component Value Date   NA 136 07/23/2017    K 3.6 07/23/2017   CL 107 07/23/2017   CO2 19 (L) 07/23/2017   GLUCOSE 117 (H) 07/23/2017   BUN 13 07/23/2017   CREATININE 0.70 06/09/2018   CALCIUM 9.6 07/23/2017   PROT 7.4 07/23/2017   ALBUMIN 4.0 07/23/2017   AST 26 07/23/2017   ALT 18 07/23/2017   ALKPHOS 68 07/23/2017   BILITOT 0.6 07/23/2017   GFRNONAA >60 07/23/2017   GFRAA >60 07/23/2017    Lab Results  Component Value Date   WBC 3.8 07/23/2017   NEUTROABS 2.8 07/23/2017   HGB 12.4 07/23/2017   HCT 36.3 07/23/2017   MCV 96.7 07/23/2017   PLT 331 07/23/2017     STUDIES: Ct Soft Tissue Neck W Contrast  Result Date: 06/09/2018 CLINICAL DATA:  History of right breast cancer. History of malignant axillary nodes on the left. EXAM: CT NECK WITH CONTRAST TECHNIQUE: Multidetector CT imaging of the neck was performed using the standard protocol following the bolus administration of intravenous contrast. CONTRAST:  72m ISOVUE-300 IOPAMIDOL (  ISOVUE-300) INJECTION 61% COMPARISON:  Head CT 04/03/2017 FINDINGS: Pharynx and larynx: No evidence of mass or swelling. Salivary glands: No inflammation, mass, or stone. Thyroid: Normal Lymph nodes: Mildly enlarged but heterogeneous/partly low-density lymph nodes the left thoracic inlet measuring 11 mm between the IJ and common carotid and measuring 1 cm in the left posterior triangle. Vascular: Negative Limited intracranial: Negative Visualized orbits: Negative Mastoids and visualized paranasal sinuses: Bilateral partial mastoid opacification, also seen 02/11/2018 by head CT-although progressed on the right. Skeleton: No acute or aggressive finding Upper chest: Reported separately IMPRESSION: 1. 2 mildly enlarged and heterogeneous lymph nodes located in the left thoracic inlet and left posterior triangle neck, most consistent with malignant adenopathy. 2. Chest reported separately. Electronically Signed   By: Monte Fantasia M.D.   On: 06/09/2018 13:53   Ct Chest W Contrast  Result Date:  06/09/2018 CLINICAL DATA:  Right-sided breast cancer 2015. Double mastectomy and chemotherapy. Left axillary node metastasis in 2018. EXAM: CT CHEST, ABDOMEN, AND PELVIS WITH CONTRAST TECHNIQUE: Multidetector CT imaging of the chest, abdomen and pelvis was performed following the standard protocol during bolus administration of intravenous contrast. CONTRAST:  19m ISOVUE-300 IOPAMIDOL (ISOVUE-300) INJECTION 61% COMPARISON:  Today's neck CT, dictated separately. Prior chest abdomen and pelvic CTs of 09/25/2017. FINDINGS: CT CHEST FINDINGS Cardiovascular: A right-sided Port-A-Cath which terminates at the high right atrium. Aortic atherosclerosis. Heart size accentuated by a pectus excavatum deformity. No pericardial effusion. No central pulmonary embolism, on this non-dedicated study. Mediastinum/Nodes: Enlargement of a left supraclavicular/thoracic inlet node. 9 mm on image 7/4 versus 3 mm on the prior. An 8 mm left axillary node measures 9 mm on the prior exam and is not pathologic by size criteria. No subpectoral adenopathy. No mediastinal or hilar adenopathy.  No internal mammary adenopathy. Lungs/Pleura: No pleural fluid. 4 mm right middle lobe pulmonary nodule is unchanged. There is mild volume loss in the right middle lobe with lateral peribronchovascular interstitial thickening, similar. Posterior right middle lobe foci of ground-glass including on image 61/6, are new and favored to represent foci of subsegmental atelectasis. Lingular scarring again identified. Musculoskeletal: Bilateral breast implants. CT ABDOMEN PELVIS FINDINGS Hepatobiliary: High left hepatic lobe too small to characterize lesion is unchanged. Normal gallbladder, without biliary ductal dilatation. Pancreas: Normal, without mass or ductal dilatation. Spleen: Normal in size, without focal abnormality. Adrenals/Urinary Tract: Normal adrenal glands. Left renal collecting system calculi, including maximally 8 mm in the left renal pelvis. Too  small to characterize left renal lesions. Punctate lower pole right renal collecting system calculus. Normal urinary bladder. Stomach/Bowel: gastric antral underdistention. Periampullary duodenal diverticulum. Colonic stool burden suggests constipation. Otherwise normal small bowel. Vascular/Lymphatic: Aortic atherosclerosis. A left periaortic index node measures 8 mm on image 71/4 versus 4 mm on the prior exam (when remeasured). A left common iliac node measures 7 mm on image 74/4 versus 2 mm on the prior. Reproductive: Hysterectomy. Residual left ovarian follicles of up to 1.9 cm, similar. Other: No significant free fluid. No evidence of omental or peritoneal disease. Musculoskeletal: Thoracolumbar spondylosis. S-shaped thoracolumbar spine curvature. IMPRESSION: 1. Enlarging nodes at the abdominopelvic junction and at the level of the thoracic inlet, most consistent with nodal metastasis. 2.  Aortic Atherosclerosis (ICD10-I70.0). 3. Left greater than right nephrolithiasis. 4.  Possible constipation. 5. Similar right middle lobe nodule and interstitial thickening, likely post infectious or inflammatory. Electronically Signed   By: KAbigail MiyamotoM.D.   On: 06/09/2018 15:12   Ct Abdomen Pelvis W Contrast  Result Date:  06/09/2018 CLINICAL DATA:  Right-sided breast cancer 2015. Double mastectomy and chemotherapy. Left axillary node metastasis in 2018. EXAM: CT CHEST, ABDOMEN, AND PELVIS WITH CONTRAST TECHNIQUE: Multidetector CT imaging of the chest, abdomen and pelvis was performed following the standard protocol during bolus administration of intravenous contrast. CONTRAST:  44m ISOVUE-300 IOPAMIDOL (ISOVUE-300) INJECTION 61% COMPARISON:  Today's neck CT, dictated separately. Prior chest abdomen and pelvic CTs of 09/25/2017. FINDINGS: CT CHEST FINDINGS Cardiovascular: A right-sided Port-A-Cath which terminates at the high right atrium. Aortic atherosclerosis. Heart size accentuated by a pectus excavatum  deformity. No pericardial effusion. No central pulmonary embolism, on this non-dedicated study. Mediastinum/Nodes: Enlargement of a left supraclavicular/thoracic inlet node. 9 mm on image 7/4 versus 3 mm on the prior. An 8 mm left axillary node measures 9 mm on the prior exam and is not pathologic by size criteria. No subpectoral adenopathy. No mediastinal or hilar adenopathy.  No internal mammary adenopathy. Lungs/Pleura: No pleural fluid. 4 mm right middle lobe pulmonary nodule is unchanged. There is mild volume loss in the right middle lobe with lateral peribronchovascular interstitial thickening, similar. Posterior right middle lobe foci of ground-glass including on image 61/6, are new and favored to represent foci of subsegmental atelectasis. Lingular scarring again identified. Musculoskeletal: Bilateral breast implants. CT ABDOMEN PELVIS FINDINGS Hepatobiliary: High left hepatic lobe too small to characterize lesion is unchanged. Normal gallbladder, without biliary ductal dilatation. Pancreas: Normal, without mass or ductal dilatation. Spleen: Normal in size, without focal abnormality. Adrenals/Urinary Tract: Normal adrenal glands. Left renal collecting system calculi, including maximally 8 mm in the left renal pelvis. Too small to characterize left renal lesions. Punctate lower pole right renal collecting system calculus. Normal urinary bladder. Stomach/Bowel: gastric antral underdistention. Periampullary duodenal diverticulum. Colonic stool burden suggests constipation. Otherwise normal small bowel. Vascular/Lymphatic: Aortic atherosclerosis. A left periaortic index node measures 8 mm on image 71/4 versus 4 mm on the prior exam (when remeasured). A left common iliac node measures 7 mm on image 74/4 versus 2 mm on the prior. Reproductive: Hysterectomy. Residual left ovarian follicles of up to 1.9 cm, similar. Other: No significant free fluid. No evidence of omental or peritoneal disease. Musculoskeletal:  Thoracolumbar spondylosis. S-shaped thoracolumbar spine curvature. IMPRESSION: 1. Enlarging nodes at the abdominopelvic junction and at the level of the thoracic inlet, most consistent with nodal metastasis. 2.  Aortic Atherosclerosis (ICD10-I70.0). 3. Left greater than right nephrolithiasis. 4.  Possible constipation. 5. Similar right middle lobe nodule and interstitial thickening, likely post infectious or inflammatory. Electronically Signed   By: KAbigail MiyamotoM.D.   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    PLAN:    1. 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  Patient's most recent MRI of her brain on September 25, 2017 revealed continued improvement of patient's for known metastatic deposits.  Patient's CA-27.29 continues to be within normal limits.  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  Cardiac echo on April 30, 2018 revealed an estimated EF of 55% with no pericardial effusion reported.  Because of patient's nodal metastasis, will get a PET scan for further evaluation and assessment of her disease burden.  In addition to the Afinitor mentioned above, given the fact that she is HER-2 positive ado-trastuzumab every 3 weeks is also an option for treatment.  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 2. Brain metastasis: Essentially resolved.  Patient has completed XRT.  MRI from September 25, 2017 reviewed independently with continued improvement of known metastatic lesions.  CT scan of head done after patient's fall on February 11, 2018 reviewed independently and reported as above with no new or progressive disease.  No further imaging is necessary unless there is suspicion of recurrence.   3. Malignant pericardial fluid: Resolved.  Cardiac echo as above. Continue follow-up with cardiology as indicated. 4.  Lymphedema: Continue follow-up in lymphedema clinic and treatment as needed.  I spent a total of 30 minutes face-to-face with the patient of which greater than 50% of the visit was spent in counseling and coordination of care as detailed above.   Patient expressed understanding and was in agreement with this plan. She also understands that She can call clinic at any time with any questions, concerns, or complaints.    Breast cancer   Staging form: Breast, AJCC 7th Edition     Clinical stage from 02/14/2015: Stage IA (T1c, N0, M0) - Signed by Lloyd Huger, MD on 02/14/2015   Lloyd Huger, MD   06/14/2018 8:05 AM

## 2018-06-12 ENCOUNTER — Inpatient Hospital Stay: Payer: 59 | Attending: Oncology | Admitting: Oncology

## 2018-06-12 ENCOUNTER — Encounter: Payer: Self-pay | Admitting: Oncology

## 2018-06-12 VITALS — BP 109/73 | HR 76 | Temp 97.6°F | Resp 18 | Wt 176.0 lb

## 2018-06-12 DIAGNOSIS — C50411 Malignant neoplasm of upper-outer quadrant of right female breast: Secondary | ICD-10-CM | POA: Diagnosis not present

## 2018-06-12 DIAGNOSIS — C773 Secondary and unspecified malignant neoplasm of axilla and upper limb lymph nodes: Secondary | ICD-10-CM | POA: Insufficient documentation

## 2018-06-12 DIAGNOSIS — I890 Lymphedema, not elsewhere classified: Secondary | ICD-10-CM | POA: Insufficient documentation

## 2018-06-12 DIAGNOSIS — C7931 Secondary malignant neoplasm of brain: Secondary | ICD-10-CM | POA: Insufficient documentation

## 2018-06-12 NOTE — Progress Notes (Signed)
Patient here today for results, denies concerns.

## 2018-06-19 ENCOUNTER — Encounter
Admission: RE | Admit: 2018-06-19 | Discharge: 2018-06-19 | Disposition: A | Discharge status: Home / Self Care | Payer: 59 | Source: Ambulatory Visit | Attending: Oncology | Admitting: Oncology

## 2018-06-19 DIAGNOSIS — C50411 Malignant neoplasm of upper-outer quadrant of right female breast: Secondary | ICD-10-CM | POA: Insufficient documentation

## 2018-06-19 DIAGNOSIS — C50911 Malignant neoplasm of unspecified site of right female breast: Secondary | ICD-10-CM | POA: Diagnosis not present

## 2018-06-19 LAB — GLUCOSE, CAPILLARY: Glucose-Capillary: 48 mg/dL — ABNORMAL LOW (ref 70–99)

## 2018-06-19 MED ORDER — FLUDEOXYGLUCOSE F - 18 (FDG) INJECTION
9.1000 | Freq: Once | INTRAVENOUS | Status: AC | PRN
  Administered 2018-06-19: 9.35 via INTRAVENOUS

## 2018-06-22 NOTE — Progress Notes (Signed)
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   INTERVAL HISTORY: Patient returns to clinic today for further evaluation, discussion of her PET scan results, and treatment planning.  She is anxious, but otherwise feels well.  She continues to be active and work full-time. She has no neurologic complaints.  She does not complain of any weakness or fatigue today. She denies any recent fevers or illnesses.  She has a fair appetite and has maintained her weight.  She has no chest pain, cough, or shortness of breath.  She denies any nausea, vomiting, constipation, or diarrhea.  She has no urinary complaints.  Patient offers no specific complaints today.  REVIEW OF SYSTEMS:   Review of Systems  Constitutional: Negative.  Negative for fever, malaise/fatigue and weight loss.  HENT: Negative.  Negative for congestion and hearing loss.   Respiratory: Negative.  Negative for cough and shortness of breath.   Cardiovascular: Negative.  Negative for chest pain and leg swelling.  Gastrointestinal: Negative.  Negative for abdominal pain, diarrhea and vomiting.  Genitourinary: Negative.  Negative for dysuria.  Musculoskeletal: Negative.  Negative for back pain and falls.  Skin: Negative.  Negative for itching and rash.  Neurological: Negative.  Negative for sensory change, focal weakness and weakness.  Psychiatric/Behavioral: The patient is nervous/anxious. The patient does not have insomnia.    As per HPI. Otherwise, a complete review of systems is  negative.   PAST MEDICAL HISTORY:  Hypothyroidism, migraines, depression, asthma.  PAST SURGICAL HISTORY: Bilateral mastectomy with reconstruction, partial hysterectomy.  FAMILY HISTORY: Lung cancer, melanoma, stomach cancer.  Also diabetes, CAD, hypertension     ADVANCED DIRECTIVES:    HEALTH MAINTENANCE: Social History   Tobacco Use  . Smoking status: Never Smoker  . Smokeless tobacco: Never Used  Substance Use Topics  . Alcohol use: No  . Drug use: No     Colonoscopy:  PAP:  Bone density:  Lipid panel:  Allergies  Allergen Reactions  . Codeine Other (See Comments)    constipation  . Sulfa Antibiotics Other (See Comments)    Allergy as a child    Current Outpatient Medications  Medication Sig Dispense Refill  . alendronate (FOSAMAX) 70 MG tablet Take 1 tablet (70 mg total) by mouth once a week. Take with a full glass of water on an empty stomach. 12 tablet 1  . ALPRAZolam (XANAX) 0.25 MG tablet Take 1 tablet by mouth 3 (three) times daily as needed for anxiety.     Marland Kitchen amoxicillin-clavulanate (AUGMENTIN) 875-125 MG tablet TK 1 T PO BID FOR 10 DAYS  0  . azelastine (ASTELIN) 0.1 % nasal spray Place 1 spray into both nostrils daily as needed for rhinitis. Use in each nostril as directed    . calcium-vitamin D (OSCAL WITH D) 250-125 MG-UNIT tablet Take 1 tablet by mouth daily.    . Cholecalciferol (D3 HIGH POTENCY) 2000 units CAPS Take 1 capsule by mouth daily.    . cyclobenzaprine (FLEXERIL) 10 MG tablet Take 10 mg by mouth 3 (three) times daily as needed.     . DULoxetine (CYMBALTA) 30  MG capsule Take 90 mg by mouth every morning.     . fluticasone (FLONASE) 50 MCG/ACT nasal spray SPRAY TWICE IEN ONCE D  12  . fluticasone (FLOVENT HFA) 110 MCG/ACT inhaler Inhale 1 puff into the lungs 2 (two) times daily.    Marland Kitchen levothyroxine (SYNTHROID, LEVOTHROID) 137 MCG tablet Take by mouth daily before breakfast.     . lidocaine-prilocaine (EMLA) cream Apply to affected area once 30  g 3  . metoprolol succinate (TOPROL-XL) 25 MG 24 hr tablet Take 75 mg by mouth at bedtime.     . Multiple Vitamin (MULTIVITAMIN) tablet Take 1 tablet by mouth daily.    . ondansetron (ZOFRAN) 8 MG tablet Take 1 tablet (8 mg total) by mouth 2 (two) times daily as needed (Nausea or vomiting). 60 tablet 3  . phenazopyridine (PYRIDIUM) 100 MG tablet Take 1 tablet (100 mg total) by mouth 3 (three) times daily as needed for pain. 10 tablet 0  . topiramate (TOPAMAX) 50 MG tablet Take 50 mg by mouth at bedtime.     . traMADol (ULTRAM) 50 MG tablet Take 25-50 mg by mouth every 6 (six) hours as needed.     . Triamcinolone Acetonide (TRIAMCINOLONE 0.1 % CREAM : EUCERIN) CREA Apply 1 application topically 2 (two) times daily as needed. 1 each 0  . triamcinolone ointment (KENALOG) 0.5 % APPLY EXTERNALLY TO THE AFFECTED AREA TWICE DAILY 30 g 0  . exemestane (AROMASIN) 25 MG tablet Take 1 tablet (25 mg total) by mouth daily after breakfast. 90 tablet 2  . prochlorperazine (COMPAZINE) 10 MG tablet TAKE 1 TABLET(10 MG) BY MOUTH EVERY 6 HOURS AS NEEDED FOR NAUSEA OR VOMITING (Patient not taking: Reported on 05/07/2018) 60 tablet 0   No current facility-administered medications for this visit.     OBJECTIVE: Vitals:   06/23/18 1017  BP: 121/72  Pulse: 73  Resp: 18  Temp: 97.8 F (36.6 C)     Body mass index is 30.59 kg/m.    ECOG FS:0 - Asymptomatic  General: Well-developed, well-nourished, no acute distress. Eyes: Pink conjunctiva, anicteric sclera. HEENT: Normocephalic, moist mucous membranes. Breast: Bilateral mastectomy. Lungs: Clear to auscultation bilaterally. Heart: Regular rate and rhythm. No rubs, murmurs, or gallops. Abdomen: Soft, nontender, nondistended. No organomegaly noted, normoactive bowel sounds. Musculoskeletal: No edema, cyanosis, or clubbing. Neuro: Alert, answering all questions appropriately. Cranial nerves grossly intact. Skin: No rashes or petechiae noted. Psych: Normal  affect.  LAB RESULTS:  Lab Results  Component Value Date   NA 136 07/23/2017   K 3.6 07/23/2017   CL 107 07/23/2017   CO2 19 (L) 07/23/2017   GLUCOSE 117 (H) 07/23/2017   BUN 13 07/23/2017   CREATININE 0.70 06/09/2018   CALCIUM 9.6 07/23/2017   PROT 7.4 07/23/2017   ALBUMIN 4.0 07/23/2017   AST 26 07/23/2017   ALT 18 07/23/2017   ALKPHOS 68 07/23/2017   BILITOT 0.6 07/23/2017   GFRNONAA >60 07/23/2017   GFRAA >60 07/23/2017    Lab Results  Component Value Date   WBC 3.8 07/23/2017   NEUTROABS 2.8 07/23/2017   HGB 12.4 07/23/2017   HCT 36.3 07/23/2017   MCV 96.7 07/23/2017   PLT 331 07/23/2017     STUDIES: Ct Soft Tissue Neck W Contrast  Result Date: 06/09/2018 CLINICAL DATA:  History of right breast cancer. History of malignant axillary nodes on the left. EXAM: CT NECK WITH CONTRAST TECHNIQUE: Multidetector CT imaging of the neck was performed using the standard protocol following  the bolus administration of intravenous contrast. CONTRAST:  80m ISOVUE-300 IOPAMIDOL (ISOVUE-300) INJECTION 61% COMPARISON:  Head CT 04/03/2017 FINDINGS: Pharynx and larynx: No evidence of mass or swelling. Salivary glands: No inflammation, mass, or stone. Thyroid: Normal Lymph nodes: Mildly enlarged but heterogeneous/partly low-density lymph nodes the left thoracic inlet measuring 11 mm between the IJ and common carotid and measuring 1 cm in the left posterior triangle. Vascular: Negative Limited intracranial: Negative Visualized orbits: Negative Mastoids and visualized paranasal sinuses: Bilateral partial mastoid opacification, also seen 02/11/2018 by head CT-although progressed on the right. Skeleton: No acute or aggressive finding Upper chest: Reported separately IMPRESSION: 1. 2 mildly enlarged and heterogeneous lymph nodes located in the left thoracic inlet and left posterior triangle neck, most consistent with malignant adenopathy. 2. Chest reported separately. Electronically Signed   By:  JMonte FantasiaM.D.   On: 06/09/2018 13:53   Ct Chest W Contrast  Result Date: 06/09/2018 CLINICAL DATA:  Right-sided breast cancer 2015. Double mastectomy and chemotherapy. Left axillary node metastasis in 2018. EXAM: CT CHEST, ABDOMEN, AND PELVIS WITH CONTRAST TECHNIQUE: Multidetector CT imaging of the chest, abdomen and pelvis was performed following the standard protocol during bolus administration of intravenous contrast. CONTRAST:  828mISOVUE-300 IOPAMIDOL (ISOVUE-300) INJECTION 61% COMPARISON:  Today's neck CT, dictated separately. Prior chest abdomen and pelvic CTs of 09/25/2017. FINDINGS: CT CHEST FINDINGS Cardiovascular: A right-sided Port-A-Cath which terminates at the high right atrium. Aortic atherosclerosis. Heart size accentuated by a pectus excavatum deformity. No pericardial effusion. No central pulmonary embolism, on this non-dedicated study. Mediastinum/Nodes: Enlargement of a left supraclavicular/thoracic inlet node. 9 mm on image 7/4 versus 3 mm on the prior. An 8 mm left axillary node measures 9 mm on the prior exam and is not pathologic by size criteria. No subpectoral adenopathy. No mediastinal or hilar adenopathy.  No internal mammary adenopathy. Lungs/Pleura: No pleural fluid. 4 mm right middle lobe pulmonary nodule is unchanged. There is mild volume loss in the right middle lobe with lateral peribronchovascular interstitial thickening, similar. Posterior right middle lobe foci of ground-glass including on image 61/6, are new and favored to represent foci of subsegmental atelectasis. Lingular scarring again identified. Musculoskeletal: Bilateral breast implants. CT ABDOMEN PELVIS FINDINGS Hepatobiliary: High left hepatic lobe too small to characterize lesion is unchanged. Normal gallbladder, without biliary ductal dilatation. Pancreas: Normal, without mass or ductal dilatation. Spleen: Normal in size, without focal abnormality. Adrenals/Urinary Tract: Normal adrenal glands. Left  renal collecting system calculi, including maximally 8 mm in the left renal pelvis. Too small to characterize left renal lesions. Punctate lower pole right renal collecting system calculus. Normal urinary bladder. Stomach/Bowel: gastric antral underdistention. Periampullary duodenal diverticulum. Colonic stool burden suggests constipation. Otherwise normal small bowel. Vascular/Lymphatic: Aortic atherosclerosis. A left periaortic index node measures 8 mm on image 71/4 versus 4 mm on the prior exam (when remeasured). A left common iliac node measures 7 mm on image 74/4 versus 2 mm on the prior. Reproductive: Hysterectomy. Residual left ovarian follicles of up to 1.9 cm, similar. Other: No significant free fluid. No evidence of omental or peritoneal disease. Musculoskeletal: Thoracolumbar spondylosis. S-shaped thoracolumbar spine curvature. IMPRESSION: 1. Enlarging nodes at the abdominopelvic junction and at the level of the thoracic inlet, most consistent with nodal metastasis. 2.  Aortic Atherosclerosis (ICD10-I70.0). 3. Left greater than right nephrolithiasis. 4.  Possible constipation. 5. Similar right middle lobe nodule and interstitial thickening, likely post infectious or inflammatory. Electronically Signed   By: KyAbigail Miyamoto.D.   On: 06/09/2018  15:12   Ct Abdomen Pelvis W Contrast  Result Date: 06/09/2018 CLINICAL DATA:  Right-sided breast cancer 2015. Double mastectomy and chemotherapy. Left axillary node metastasis in 2018. EXAM: CT CHEST, ABDOMEN, AND PELVIS WITH CONTRAST TECHNIQUE: Multidetector CT imaging of the chest, abdomen and pelvis was performed following the standard protocol during bolus administration of intravenous contrast. CONTRAST:  60m ISOVUE-300 IOPAMIDOL (ISOVUE-300) INJECTION 61% COMPARISON:  Today's neck CT, dictated separately. Prior chest abdomen and pelvic CTs of 09/25/2017. FINDINGS: CT CHEST FINDINGS Cardiovascular: A right-sided Port-A-Cath which terminates at the high  right atrium. Aortic atherosclerosis. Heart size accentuated by a pectus excavatum deformity. No pericardial effusion. No central pulmonary embolism, on this non-dedicated study. Mediastinum/Nodes: Enlargement of a left supraclavicular/thoracic inlet node. 9 mm on image 7/4 versus 3 mm on the prior. An 8 mm left axillary node measures 9 mm on the prior exam and is not pathologic by size criteria. No subpectoral adenopathy. No mediastinal or hilar adenopathy.  No internal mammary adenopathy. Lungs/Pleura: No pleural fluid. 4 mm right middle lobe pulmonary nodule is unchanged. There is mild volume loss in the right middle lobe with lateral peribronchovascular interstitial thickening, similar. Posterior right middle lobe foci of ground-glass including on image 61/6, are new and favored to represent foci of subsegmental atelectasis. Lingular scarring again identified. Musculoskeletal: Bilateral breast implants. CT ABDOMEN PELVIS FINDINGS Hepatobiliary: High left hepatic lobe too small to characterize lesion is unchanged. Normal gallbladder, without biliary ductal dilatation. Pancreas: Normal, without mass or ductal dilatation. Spleen: Normal in size, without focal abnormality. Adrenals/Urinary Tract: Normal adrenal glands. Left renal collecting system calculi, including maximally 8 mm in the left renal pelvis. Too small to characterize left renal lesions. Punctate lower pole right renal collecting system calculus. Normal urinary bladder. Stomach/Bowel: gastric antral underdistention. Periampullary duodenal diverticulum. Colonic stool burden suggests constipation. Otherwise normal small bowel. Vascular/Lymphatic: Aortic atherosclerosis. A left periaortic index node measures 8 mm on image 71/4 versus 4 mm on the prior exam (when remeasured). A left common iliac node measures 7 mm on image 74/4 versus 2 mm on the prior. Reproductive: Hysterectomy. Residual left ovarian follicles of up to 1.9 cm, similar. Other: No  significant free fluid. No evidence of omental or peritoneal disease. Musculoskeletal: Thoracolumbar spondylosis. S-shaped thoracolumbar spine curvature. IMPRESSION: 1. Enlarging nodes at the abdominopelvic junction and at the level of the thoracic inlet, most consistent with nodal metastasis. 2.  Aortic Atherosclerosis (ICD10-I70.0). 3. Left greater than right nephrolithiasis. 4.  Possible constipation. 5. Similar right middle lobe nodule and interstitial thickening, likely post infectious or inflammatory. Electronically Signed   By: KAbigail MiyamotoM.D.   On: 06/09/2018 15:12   Nm Pet Image Restag (ps) Skull Base To Thigh  Result Date: 06/19/2018 CLINICAL DATA:  Subsequent treatment strategy for metastatic right breast cancer. EXAM: NUCLEAR MEDICINE PET SKULL BASE TO THIGH TECHNIQUE: 9.4 mCi F-18 FDG was injected intravenously. Full-ring PET imaging was performed from the skull base to thigh after the radiotracer. CT data was obtained and used for attenuation correction and anatomic localization. Fasting blood glucose: 48 mg/dl COMPARISON:  Multiple exams, including 04/03/2017 PET-CT FINDINGS: Mediastinal blood pool activity: SUV max 2.8 NECK: Abnormal hypodensity along the left posterior limb of the internal capsule tracking adjacent to the occipital horn and temporal horn of the left lateral ventricle with corresponding mild reduced activity on the PET-CT, possibly from tumor, vasogenic edema associated with tumor, or localized radiation therapy. A left level V lymph node in the neck measures 0.7 cm  in short axis on image 45/4 with maximum SUV 6.7, compatible with active malignancy. There is hypermetabolic activity in both lobes of the thyroid, maximum SUV 13.1 on the right and 10.5 on the left, most commonly due to thyroiditis, and mildly increased compared to the prior exam. Adjacent to the thyroid gland in the supraclavicular chain, a 0.8 cm in short axis lymph node on image 62/4 has a maximum SUV of 7.7.  Incidental CT findings: Bilateral mastoid effusions. CHEST: 0.5 cm left axillary node on image 64/4 has a maximum SUV of 3.3. Other small left axillary nodes likewise have low-grade activity. A 5 by 3 mm right middle lobe nodule on image 106/4 does not have individually appreciable accentuated metabolic activity. Incidental CT findings: Bilateral breast implants. Right Port-A-Cath tip: Cavoatrial junction. Mild pectus excavatum and borderline cardiomegaly. ABDOMEN/PELVIS: Again observed is slight thickening of the lateral limb right adrenal gland without a discrete mass. The right adrenal gland continues to be notably hypermetabolic, maximum SUV 22.6 and formerly 9.5. The left adrenal gland appears normal. A left 0.7 cm periaortic lymph node on image 166/4 previously measured 0.6 cm, and has a maximum SUV of 8.9 (formerly 6.5). Just below this on image 171/4 a left common iliac node measures 1.0 cm in short axis (formerly 0.4 cm) with maximum SUV 8.3 (formerly 3.2). Suspected hypermetabolic focus in the left ovary posteriorly measuring about 1.6 by 1.2 cm in diameter on image 212/4, maximum SUV 8.1. It is tenting to call this activity as being due to the distal ureter, but it appears to be slightly more medial than the distal ureter and most likely in the left ovary. There is an adjacent 1.8 cm relatively photopenic ovarian cyst on the left. Incidental CT findings: Nonobstructive bilateral nephrolithiasis including a 0.9 cm left collecting system calculus. Tiny hypodense lesion in segment 2 of the liver is not appreciably hypermetabolic and is probably benign. SKELETON: No significant abnormal hypermetabolic activity in this region. Incidental CT findings: Levoconvex lumbar rotary scoliosis. IMPRESSION: 1. Compared to 04/03/2017 PET-CT there has been worsening, with interval development of hypermetabolic left supraclavicular and left neck level V lymph nodes, as well as increase in hypermetabolic activity of a left  periaortic node and new accentuated metabolic activity in a left common iliac node. 2. Abnormal hypodensity and reduced activity in the posterior limb left internal capsule of the brain. This is nonspecific and could be related to tumor, vasogenic edema, or prior radiation therapy. This could be further worked up with MRI of the brain with and without contrast if clinically warranted. 3. Small left axillary lymph nodes have only low-grade activity and are not definitively malignant. 4. Continued prominent hypermetabolic activity in the right adrenal gland, with only subtle thickening of the lateral limb and no definite mass. Normal appearance of the left adrenal gland. 5. Diffuse hypermetabolic activity in both lobes of the thyroid, probably from active thyroiditis. 6. Other imaging findings of potential clinical significance: Bilateral mastoid effusions. Nonobstructive bilateral nephrolithiasis including a 9 mm left collecting system calculus. Levoconvex lumbar rotary scoliosis. Mild pectus excavatum with borderline cardiomegaly. Electronically Signed   By: Van Clines M.D.   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    PLAN:    1. 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 Patient's most recent MRI of her brain on September 25, 2017 revealed continued improvement of patient's for known metastatic deposits.  Patient's CA-27.29  continues to be within normal limits.  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  Cardiac echo on April 30, 2018 revealed an estimated EF of 55% with no pericardial effusion reported.  Patient has elected to pursue ado-trastuzumab every [redacted] weeks along with daily Aromasin.  Return to clinic on June 24, 2018 to initiate cycle 1. 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 2. Brain metastasis: Essentially resolved.  Patient has completed XRT.  MRI from September 25, 2017 reviewed independently with continued improvement of known metastatic lesions.  CT scan of head done after patient's fall on February 11, 2018 reviewed independently with no new or progressive disease.  No further imaging is necessary unless there is suspicion of recurrence.   3. Malignant pericardial fluid: Resolved.  Cardiac echo as above. Continue follow-up with cardiology as indicated. 4.  Lymphedema: Continue follow-up in lymphedema clinic and treatment as needed.  I spent a total of 30 minutes face-to-face with the patient of which greater than 50% of the visit was spent in counseling and coordination of care as detailed above.  Patient expressed understanding and was in agreement with this plan. She also understands that She can call clinic at any time with any questions, concerns, or complaints.    Breast cancer   Staging form: Breast, AJCC 7th Edition     Clinical stage from 02/14/2015: Stage IA (T1c, N0, M0) - Signed by Lloyd Huger, MD on 02/14/2015   Lloyd Huger, MD   06/24/2018 6:41 AM

## 2018-06-23 ENCOUNTER — Inpatient Hospital Stay (HOSPITAL_BASED_OUTPATIENT_CLINIC_OR_DEPARTMENT_OTHER): Payer: 59 | Admitting: Hospice and Palliative Medicine

## 2018-06-23 ENCOUNTER — Inpatient Hospital Stay: Payer: 59 | Attending: Oncology | Admitting: Oncology

## 2018-06-23 VITALS — BP 121/72 | HR 73 | Temp 97.8°F | Resp 18 | Wt 172.7 lb

## 2018-06-23 DIAGNOSIS — Z171 Estrogen receptor negative status [ER-]: Secondary | ICD-10-CM | POA: Diagnosis not present

## 2018-06-23 DIAGNOSIS — C7931 Secondary malignant neoplasm of brain: Secondary | ICD-10-CM | POA: Insufficient documentation

## 2018-06-23 DIAGNOSIS — F329 Major depressive disorder, single episode, unspecified: Secondary | ICD-10-CM

## 2018-06-23 DIAGNOSIS — Z5112 Encounter for antineoplastic immunotherapy: Secondary | ICD-10-CM | POA: Diagnosis not present

## 2018-06-23 DIAGNOSIS — Z79899 Other long term (current) drug therapy: Secondary | ICD-10-CM | POA: Diagnosis not present

## 2018-06-23 DIAGNOSIS — Z515 Encounter for palliative care: Secondary | ICD-10-CM

## 2018-06-23 DIAGNOSIS — I890 Lymphedema, not elsewhere classified: Secondary | ICD-10-CM | POA: Insufficient documentation

## 2018-06-23 DIAGNOSIS — R531 Weakness: Secondary | ICD-10-CM

## 2018-06-23 DIAGNOSIS — C50411 Malignant neoplasm of upper-outer quadrant of right female breast: Secondary | ICD-10-CM

## 2018-06-23 DIAGNOSIS — F32A Depression, unspecified: Secondary | ICD-10-CM
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
## 2018-06-23 NOTE — Progress Notes (Signed)
Patient here today for follow up regarding breast cancer, PET results.  

## 2018-06-24 MED ORDER — EXEMESTANE 25 MG PO TABS: 25.0000 mg | ORAL_TABLET | Freq: Every day | ORAL | 2 refills | Status: DC

## 2018-06-24 NOTE — Progress Notes (Signed)
DISCONTINUE ON PATHWAY REGIMEN - Breast     A cycle is every 28 days (3 weeks on and 1 week off):     Paclitaxel   **Always confirm dose/schedule in your pharmacy ordering system**  REASON: Disease Progression PRIOR TREATMENT: [id number]: Paclitaxel 80 mg/m2 D1, 8, 15 q28 Days Until Progression or Unacceptable Toxicity TREATMENT RESPONSE: Progressive Disease (PD)  START OFF PATHWAY REGIMEN - Breast   [id number]:Ado-Trastuzumab Emtansine 3.6 mg/kg IV D1 q21 Days:   A cycle is every 21 days:     Ado-trastuzumab emtansine   **Always confirm dose/schedule in your pharmacy ordering system**  Patient Characteristics: Distant Metastases or Locoregional Recurrent Disease - Unresected or Locally Advanced Unresectable Disease Progressing after Neoadjuvant and Local Therapies, HER2 Positive, ER Negative/Unknown, Chemotherapy, Third Line Therapeutic Status: Distant Metastases BRCA Mutation Status: Did Not Order Test ER Status: Negative (-) HER2 Status: Positive (+) PR Status: Negative (-) Line of Therapy: Third Line Intent of Therapy: Non-Curative / Palliative Intent, Discussed with Patient

## 2018-06-28 NOTE — Progress Notes (Signed)
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   INTERVAL HISTORY: Patient returns to clinic today for further evaluation and initiation of cycle 1 of ado-trastuzumab. She continues to be anxious, but otherwise feels well.  She has no neurologic complaints.  She does not complain of any weakness or fatigue today. She denies any recent fevers or illnesses.  She has a fair appetite and has maintained her weight.  She has no chest pain, cough, or shortness of breath.  She denies any nausea, vomiting, constipation, or diarrhea.  She has no urinary complaints.  Patient offers no further specific complaints today.  REVIEW OF SYSTEMS:   Review of Systems  Constitutional: Negative.  Negative for fever, malaise/fatigue and weight loss.  HENT: Negative.  Negative for congestion and hearing loss.   Respiratory: Negative.  Negative for cough and shortness of breath.   Cardiovascular: Negative.  Negative for chest pain and leg swelling.  Gastrointestinal: Negative.  Negative for abdominal pain, diarrhea and vomiting.  Genitourinary: Negative.  Negative for dysuria.  Musculoskeletal: Negative.  Negative for back pain and falls.  Skin: Negative.  Negative for itching and rash.  Neurological: Negative.  Negative for sensory change, focal weakness and weakness.  Psychiatric/Behavioral: The patient is nervous/anxious. The patient does not have insomnia.    As per HPI. Otherwise, a complete review of systems is negative.   PAST MEDICAL HISTORY:  Hypothyroidism,  migraines, depression, asthma.  PAST SURGICAL HISTORY: Bilateral mastectomy with reconstruction, partial hysterectomy.  FAMILY HISTORY: Lung cancer, melanoma, stomach cancer.  Also diabetes, CAD, hypertension     ADVANCED DIRECTIVES:    HEALTH MAINTENANCE: Social History   Tobacco Use  . Smoking status: Never Smoker  . Smokeless tobacco: Never Used  Substance Use Topics  . Alcohol use: No  . Drug use: No     Colonoscopy:  PAP:  Bone density:  Lipid panel:  Allergies  Allergen Reactions  . Codeine Other (See Comments)    constipation  . Sulfa Antibiotics Other (See Comments)    Allergy as a child    Current Outpatient Medications  Medication Sig Dispense Refill  . alendronate (FOSAMAX) 70 MG tablet Take 1 tablet (70 mg total) by mouth once a week. Take with a full glass of water on an empty stomach. 12 tablet 1  . ALPRAZolam (XANAX) 0.25 MG tablet Take 1 tablet by mouth 3 (three) times daily as needed for anxiety.     Marland Kitchen amoxicillin-clavulanate (AUGMENTIN) 875-125 MG tablet TK 1 T PO BID FOR 10 DAYS  0  . azelastine (ASTELIN) 0.1 % nasal spray Place 1 spray into both nostrils daily as needed for rhinitis. Use in each nostril as directed    . calcium-vitamin D (OSCAL WITH D) 250-125 MG-UNIT tablet Take 1 tablet by mouth daily.    . Cholecalciferol (D3 HIGH POTENCY) 2000 units CAPS Take 1 capsule by mouth daily.    . cyclobenzaprine (FLEXERIL) 10 MG tablet Take 10 mg by mouth 3 (three) times daily as needed.     . DULoxetine (CYMBALTA) 30 MG capsule Take 90 mg by mouth every  morning.     Marland Kitchen exemestane (AROMASIN) 25 MG tablet Take 1 tablet (25 mg total) by mouth daily after breakfast. 90 tablet 2  . fluticasone (FLONASE) 50 MCG/ACT nasal spray SPRAY TWICE IEN ONCE D  12  . fluticasone (FLOVENT HFA) 110 MCG/ACT inhaler Inhale 1 puff into the lungs 2 (two) times daily.    Marland Kitchen levothyroxine (SYNTHROID, LEVOTHROID) 137 MCG tablet Take by mouth daily before breakfast.     .  lidocaine-prilocaine (EMLA) cream Apply to affected area once 30 g 3  . metoprolol succinate (TOPROL-XL) 25 MG 24 hr tablet Take 75 mg by mouth at bedtime.     . Multiple Vitamin (MULTIVITAMIN) tablet Take 1 tablet by mouth daily.    . ondansetron (ZOFRAN) 8 MG tablet Take 1 tablet (8 mg total) by mouth 2 (two) times daily as needed (Nausea or vomiting). 60 tablet 3  . phenazopyridine (PYRIDIUM) 100 MG tablet Take 1 tablet (100 mg total) by mouth 3 (three) times daily as needed for pain. 10 tablet 0  . prochlorperazine (COMPAZINE) 10 MG tablet TAKE 1 TABLET(10 MG) BY MOUTH EVERY 6 HOURS AS NEEDED FOR NAUSEA OR VOMITING 60 tablet 0  . topiramate (TOPAMAX) 50 MG tablet Take 50 mg by mouth at bedtime.     . traMADol (ULTRAM) 50 MG tablet Take 25-50 mg by mouth every 6 (six) hours as needed.     . Triamcinolone Acetonide (TRIAMCINOLONE 0.1 % CREAM : EUCERIN) CREA Apply 1 application topically 2 (two) times daily as needed. 1 each 0  . triamcinolone ointment (KENALOG) 0.5 % APPLY EXTERNALLY TO THE AFFECTED AREA TWICE DAILY 30 g 0   No current facility-administered medications for this visit.     OBJECTIVE: Vitals:   07/03/18 0854 07/03/18 0857  BP:  109/74  Pulse:  67  Resp: 12   Temp:  (!) 97.1 F (36.2 C)     Body mass index is 30.57 kg/m.    ECOG FS:0 - Asymptomatic  General: Well-developed, well-nourished, no acute distress. Eyes: Pink conjunctiva, anicteric sclera. HEENT: Normocephalic, moist mucous membranes. Breast: Bilateral mastectomy. Lungs: Clear to auscultation bilaterally. Heart: Regular rate and rhythm. No rubs, murmurs, or gallops. Abdomen: Soft, nontender, nondistended. No organomegaly noted, normoactive bowel sounds. Musculoskeletal: No edema, cyanosis, or clubbing. Neuro: Alert, answering all questions appropriately. Cranial nerves grossly intact. Skin: No rashes or petechiae noted. Psych: Normal affect.  LAB RESULTS:  Lab Results  Component Value Date   NA 136  07/23/2017   K 3.6 07/23/2017   CL 107 07/23/2017   CO2 19 (L) 07/23/2017   GLUCOSE 117 (H) 07/23/2017   BUN 13 07/23/2017   CREATININE 0.70 06/09/2018   CALCIUM 9.6 07/23/2017   PROT 7.4 07/23/2017   ALBUMIN 4.0 07/23/2017   AST 26 07/23/2017   ALT 18 07/23/2017   ALKPHOS 68 07/23/2017   BILITOT 0.6 07/23/2017   GFRNONAA >60 07/23/2017   GFRAA >60 07/23/2017    Lab Results  Component Value Date   WBC 3.8 07/23/2017   NEUTROABS 2.8 07/23/2017   HGB 12.4 07/23/2017   HCT 36.3 07/23/2017   MCV 96.7 07/23/2017   PLT 331 07/23/2017     STUDIES: Ct Soft Tissue Neck W Contrast  Result Date: 06/09/2018 CLINICAL DATA:  History of right breast cancer. History of malignant axillary nodes on the left. EXAM: CT NECK WITH CONTRAST TECHNIQUE: Multidetector CT imaging of the neck was performed using the standard protocol following the bolus administration of intravenous contrast. CONTRAST:  58m ISOVUE-300 IOPAMIDOL (ISOVUE-300) INJECTION 61% COMPARISON:  Head CT 04/03/2017 FINDINGS: Pharynx and larynx: No evidence of mass or swelling. Salivary glands: No inflammation, mass, or stone. Thyroid: Normal Lymph nodes: Mildly enlarged but heterogeneous/partly low-density lymph nodes the left thoracic inlet measuring 11 mm between the IJ and common carotid and measuring 1 cm in the left posterior triangle. Vascular: Negative Limited intracranial: Negative Visualized orbits: Negative Mastoids and visualized paranasal sinuses: Bilateral partial mastoid opacification, also seen 02/11/2018 by head CT-although progressed on the right. Skeleton: No acute or aggressive finding Upper chest: Reported separately IMPRESSION: 1. 2 mildly enlarged and heterogeneous lymph nodes located in the left thoracic inlet and left posterior triangle neck, most consistent with malignant adenopathy. 2. Chest reported separately. Electronically Signed   By: JMonte FantasiaM.D.   On: 06/09/2018 13:53   Ct Chest W  Contrast  Result Date: 06/09/2018 CLINICAL DATA:  Right-sided breast cancer 2015. Double mastectomy and chemotherapy. Left axillary node metastasis in 2018. EXAM: CT CHEST, ABDOMEN, AND PELVIS WITH CONTRAST TECHNIQUE: Multidetector CT imaging of the chest, abdomen and pelvis was performed following the standard protocol during bolus administration of intravenous contrast. CONTRAST:  852mISOVUE-300 IOPAMIDOL (ISOVUE-300) INJECTION 61% COMPARISON:  Today's neck CT, dictated separately. Prior chest abdomen and pelvic CTs of 09/25/2017. FINDINGS: CT CHEST FINDINGS Cardiovascular: A right-sided Port-A-Cath which terminates at the high right atrium. Aortic atherosclerosis. Heart size accentuated by a pectus excavatum deformity. No pericardial effusion. No central pulmonary embolism, on this non-dedicated study. Mediastinum/Nodes: Enlargement of a left supraclavicular/thoracic inlet node. 9 mm on image 7/4 versus 3 mm on the prior. An 8 mm left axillary node measures 9 mm on the prior exam and is not pathologic by size criteria. No subpectoral adenopathy. No mediastinal or hilar adenopathy.  No internal mammary adenopathy. Lungs/Pleura: No pleural fluid. 4 mm right middle lobe pulmonary nodule is unchanged. There is mild volume loss in the right middle lobe with lateral peribronchovascular interstitial thickening, similar. Posterior right middle lobe foci of ground-glass including on image 61/6, are new and favored to represent foci of subsegmental atelectasis. Lingular scarring again identified. Musculoskeletal: Bilateral breast implants. CT ABDOMEN PELVIS FINDINGS Hepatobiliary: High left hepatic lobe too small to characterize lesion is unchanged. Normal gallbladder, without biliary ductal dilatation. Pancreas: Normal, without mass or ductal dilatation. Spleen: Normal in size, without focal abnormality. Adrenals/Urinary Tract: Normal adrenal glands. Left renal collecting system calculi, including maximally 8 mm in  the left renal pelvis. Too small to characterize left renal lesions. Punctate lower pole right renal collecting system calculus. Normal urinary bladder. Stomach/Bowel: gastric antral underdistention. Periampullary duodenal diverticulum. Colonic stool burden suggests constipation. Otherwise normal small bowel. Vascular/Lymphatic: Aortic atherosclerosis. A left periaortic index node measures 8 mm on image 71/4 versus 4 mm on the prior exam (when remeasured). A left common iliac node measures 7 mm on image 74/4 versus 2 mm on the prior. Reproductive: Hysterectomy. Residual left ovarian follicles of up to 1.9 cm, similar. Other: No significant free fluid. No evidence of omental or peritoneal disease. Musculoskeletal: Thoracolumbar spondylosis. S-shaped thoracolumbar spine curvature. IMPRESSION: 1. Enlarging nodes at the abdominopelvic junction and at the level of the thoracic inlet, most consistent with nodal metastasis. 2.  Aortic Atherosclerosis (ICD10-I70.0). 3. Left greater than right nephrolithiasis. 4.  Possible constipation. 5. Similar right middle lobe nodule and interstitial thickening, likely post infectious or inflammatory. Electronically Signed   By: KyAbigail Miyamoto.D.   On: 06/09/2018 15:12   Ct Abdomen Pelvis W Contrast  Result Date: 06/09/2018 CLINICAL DATA:  Right-sided breast cancer 2015. Double mastectomy and chemotherapy. Left axillary node metastasis in 2018. EXAM: CT CHEST, ABDOMEN, AND PELVIS WITH CONTRAST TECHNIQUE: Multidetector CT imaging of the chest, abdomen and pelvis was performed following the standard protocol during bolus administration of intravenous contrast. CONTRAST:  76m ISOVUE-300 IOPAMIDOL (ISOVUE-300) INJECTION 61% COMPARISON:  Today's neck CT, dictated separately. Prior chest abdomen and pelvic CTs of 09/25/2017. FINDINGS: CT CHEST FINDINGS Cardiovascular: A right-sided Port-A-Cath which terminates at the high right atrium. Aortic atherosclerosis. Heart size accentuated by a  pectus excavatum deformity. No pericardial effusion. No central pulmonary embolism, on this non-dedicated study. Mediastinum/Nodes: Enlargement of a left supraclavicular/thoracic inlet node. 9 mm on image 7/4 versus 3 mm on the prior. An 8 mm left axillary node measures 9 mm on the prior exam and is not pathologic by size criteria. No subpectoral adenopathy. No mediastinal or hilar adenopathy.  No internal mammary adenopathy. Lungs/Pleura: No pleural fluid. 4 mm right middle lobe pulmonary nodule is unchanged. There is mild volume loss in the right middle lobe with lateral peribronchovascular interstitial thickening, similar. Posterior right middle lobe foci of ground-glass including on image 61/6, are new and favored to represent foci of subsegmental atelectasis. Lingular scarring again identified. Musculoskeletal: Bilateral breast implants. CT ABDOMEN PELVIS FINDINGS Hepatobiliary: High left hepatic lobe too small to characterize lesion is unchanged. Normal gallbladder, without biliary ductal dilatation. Pancreas: Normal, without mass or ductal dilatation. Spleen: Normal in size, without focal abnormality. Adrenals/Urinary Tract: Normal adrenal glands. Left renal collecting system calculi, including maximally 8 mm in the left renal pelvis. Too small to characterize left renal lesions. Punctate lower pole right renal collecting system calculus. Normal urinary bladder. Stomach/Bowel: gastric antral underdistention. Periampullary duodenal diverticulum. Colonic stool burden suggests constipation. Otherwise normal small bowel. Vascular/Lymphatic: Aortic atherosclerosis. A left periaortic index node measures 8 mm on image 71/4 versus 4 mm on the prior exam (when remeasured). A left common iliac node measures 7 mm on image 74/4 versus 2 mm on the prior. Reproductive: Hysterectomy. Residual left ovarian follicles of up to 1.9 cm, similar. Other: No significant free fluid. No evidence of omental or peritoneal disease.  Musculoskeletal: Thoracolumbar spondylosis. S-shaped thoracolumbar spine curvature. IMPRESSION: 1. Enlarging nodes at the abdominopelvic junction and at the level of the thoracic inlet, most consistent with nodal metastasis. 2.  Aortic Atherosclerosis (ICD10-I70.0). 3. Left greater than right nephrolithiasis. 4.  Possible constipation. 5. Similar right middle lobe nodule and interstitial thickening, likely post infectious or inflammatory. Electronically Signed   By: KAbigail MiyamotoM.D.   On: 06/09/2018 15:12   Nm Pet Image Restag (ps) Skull Base To Thigh  Result Date: 06/19/2018 CLINICAL DATA:  Subsequent treatment strategy for metastatic right breast cancer. EXAM: NUCLEAR MEDICINE PET SKULL BASE TO THIGH TECHNIQUE: 9.4 mCi F-18 FDG was injected intravenously. Full-ring PET imaging was performed from the skull base to thigh after the radiotracer. CT data was obtained and used for attenuation correction and anatomic localization. Fasting blood glucose: 48 mg/dl COMPARISON:  Multiple exams, including 04/03/2017 PET-CT FINDINGS: Mediastinal blood pool activity: SUV max 2.8 NECK: Abnormal hypodensity along the left posterior limb of the internal capsule tracking adjacent to the occipital horn and temporal horn of the left lateral ventricle with corresponding mild reduced activity on the PET-CT, possibly from tumor, vasogenic edema associated with tumor, or localized radiation therapy. A left level V lymph node in the neck measures 0.7 cm in short axis on image 45/4 with maximum SUV  6.7, compatible with active malignancy. There is hypermetabolic activity in both lobes of the thyroid, maximum SUV 13.1 on the right and 10.5 on the left, most commonly due to thyroiditis, and mildly increased compared to the prior exam. Adjacent to the thyroid gland in the supraclavicular chain, a 0.8 cm in short axis lymph node on image 62/4 has a maximum SUV of 7.7. Incidental CT findings: Bilateral mastoid effusions. CHEST: 0.5 cm left  axillary node on image 64/4 has a maximum SUV of 3.3. Other small left axillary nodes likewise have low-grade activity. A 5 by 3 mm right middle lobe nodule on image 106/4 does not have individually appreciable accentuated metabolic activity. Incidental CT findings: Bilateral breast implants. Right Port-A-Cath tip: Cavoatrial junction. Mild pectus excavatum and borderline cardiomegaly. ABDOMEN/PELVIS: Again observed is slight thickening of the lateral limb right adrenal gland without a discrete mass. The right adrenal gland continues to be notably hypermetabolic, maximum SUV 24.2 and formerly 9.5. The left adrenal gland appears normal. A left 0.7 cm periaortic lymph node on image 166/4 previously measured 0.6 cm, and has a maximum SUV of 8.9 (formerly 6.5). Just below this on image 171/4 a left common iliac node measures 1.0 cm in short axis (formerly 0.4 cm) with maximum SUV 8.3 (formerly 3.2). Suspected hypermetabolic focus in the left ovary posteriorly measuring about 1.6 by 1.2 cm in diameter on image 212/4, maximum SUV 8.1. It is tenting to call this activity as being due to the distal ureter, but it appears to be slightly more medial than the distal ureter and most likely in the left ovary. There is an adjacent 1.8 cm relatively photopenic ovarian cyst on the left. Incidental CT findings: Nonobstructive bilateral nephrolithiasis including a 0.9 cm left collecting system calculus. Tiny hypodense lesion in segment 2 of the liver is not appreciably hypermetabolic and is probably benign. SKELETON: No significant abnormal hypermetabolic activity in this region. Incidental CT findings: Levoconvex lumbar rotary scoliosis. IMPRESSION: 1. Compared to 04/03/2017 PET-CT there has been worsening, with interval development of hypermetabolic left supraclavicular and left neck level V lymph nodes, as well as increase in hypermetabolic activity of a left periaortic node and new accentuated metabolic activity in a left common  iliac node. 2. Abnormal hypodensity and reduced activity in the posterior limb left internal capsule of the brain. This is nonspecific and could be related to tumor, vasogenic edema, or prior radiation therapy. This could be further worked up with MRI of the brain with and without contrast if clinically warranted. 3. Small left axillary lymph nodes have only low-grade activity and are not definitively malignant. 4. Continued prominent hypermetabolic activity in the right adrenal gland, with only subtle thickening of the lateral limb and no definite mass. Normal appearance of the left adrenal gland. 5. Diffuse hypermetabolic activity in both lobes of the thyroid, probably from active thyroiditis. 6. Other imaging findings of potential clinical significance: Bilateral mastoid effusions. Nonobstructive bilateral nephrolithiasis including a 9 mm left collecting system calculus. Levoconvex lumbar rotary scoliosis. Mild pectus excavatum with borderline cardiomegaly. Electronically Signed   By: Van Clines M.D.   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    PLAN:    1. Initially stage Ia, ER positive, PR negative, HER-2 positive adenocarcinoma of the upper outer quadrant  of the right breast, now with stage IV ER/PR negative, HER-2 positive metastatic breast cancer with pericardial fluid, lymphatic, and brain metastasis: PET scan results from June 19, 2018 reviewed independently and report as above confirming progressive nodal disease. Patient's most recent MRI of her brain on September 25, 2017 revealed continued improvement of patient's for known metastatic deposits.  Patient's CA-27.29 continues to be within normal limits with her most recent value of 24.9.   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  Cardiac echo on April 30, 2018 revealed an estimated EF of 55% with no pericardial effusion reported.  She will require repeat echo prior to cycle 3.  Patient has elected to pursue ado-trastuzumab every [redacted] weeks along with daily Aromasin.  CBC and metabolic panel are within normal limits today, therefore we will proceed with cycle 1.  Return to clinic in 3 weeks for further evaluation and consideration of cycle 2.  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 2. Brain metastasis: Resolved.  Patient has completed XRT.  MRI from September 25, 2017 reviewed independently with continued improvement of known metastatic lesions.  CT scan of head done after patient's fall on February 11, 2018 reviewed independently with no new or progressive disease.  No further imaging is necessary unless there is suspicion of recurrence.   3. Malignant pericardial fluid: Resolved.  Cardiac echo as above. Continue follow-up with cardiology as indicated. 4.  Lymphedema: Continue follow-up in lymphedema clinic and treatment as needed.  I spent a total of 30 minutes face-to-face with the patient of which greater than 50% of the visit was spent in counseling and coordination of care as detailed above.  Patient expressed understanding and was in agreement with this plan. She also understands that She can call clinic at any time with any questions, concerns, or complaints.    Breast cancer   Staging form: Breast, AJCC 7th Edition     Clinical stage from 02/14/2015: Stage IA (T1c, N0, M0) - Signed by Lloyd Huger, MD on 02/14/2015   Lloyd Huger, MD   07/07/2018 1:51 PM

## 2018-07-01 DIAGNOSIS — C50411 Malignant neoplasm of upper-outer quadrant of right female breast: Secondary | ICD-10-CM | POA: Diagnosis not present

## 2018-07-02 ENCOUNTER — Encounter: Payer: Self-pay | Admitting: Oncology

## 2018-07-03 ENCOUNTER — Encounter: Payer: Self-pay | Admitting: Oncology

## 2018-07-03 ENCOUNTER — Inpatient Hospital Stay (HOSPITAL_BASED_OUTPATIENT_CLINIC_OR_DEPARTMENT_OTHER): Payer: 59 | Admitting: Oncology

## 2018-07-03 ENCOUNTER — Other Ambulatory Visit: Payer: Self-pay

## 2018-07-03 ENCOUNTER — Inpatient Hospital Stay: Payer: 59

## 2018-07-03 VITALS — BP 109/74 | HR 67 | Temp 97.1°F | Resp 12 | Ht 63.0 in | Wt 172.6 lb

## 2018-07-03 DIAGNOSIS — C7931 Secondary malignant neoplasm of brain: Secondary | ICD-10-CM | POA: Diagnosis not present

## 2018-07-03 DIAGNOSIS — C50411 Malignant neoplasm of upper-outer quadrant of right female breast: Secondary | ICD-10-CM

## 2018-07-03 DIAGNOSIS — Z5112 Encounter for antineoplastic immunotherapy: Secondary | ICD-10-CM | POA: Diagnosis not present

## 2018-07-03 DIAGNOSIS — I890 Lymphedema, not elsewhere classified: Secondary | ICD-10-CM

## 2018-07-03 MED ORDER — SODIUM CHLORIDE 0.9 % IV SOLN
260.0000 mg | Freq: Once | INTRAVENOUS | Status: AC
  Administered 2018-07-03: 260 mg via INTRAVENOUS
  Filled 2018-07-03: qty 5

## 2018-07-03 MED ORDER — DIPHENHYDRAMINE HCL 25 MG PO CAPS
25.0000 mg | ORAL_CAPSULE | Freq: Once | ORAL | Status: AC
  Administered 2018-07-03: 25 mg via ORAL
  Filled 2018-07-03: qty 1

## 2018-07-03 MED ORDER — SODIUM CHLORIDE 0.9% FLUSH
10.0000 mL | INTRAVENOUS | Status: DC | PRN
  Administered 2018-07-03: 10 mL
  Filled 2018-07-03: qty 10

## 2018-07-03 MED ORDER — ACETAMINOPHEN 325 MG PO TABS
650.0000 mg | ORAL_TABLET | Freq: Once | ORAL | Status: AC
  Administered 2018-07-03: 650 mg via ORAL
  Filled 2018-07-03: qty 2

## 2018-07-03 MED ORDER — HEPARIN SOD (PORK) LOCK FLUSH 100 UNIT/ML IV SOLN
500.0000 [IU] | Freq: Once | INTRAVENOUS | Status: AC | PRN
  Administered 2018-07-03: 500 [IU]
  Filled 2018-07-03: qty 5

## 2018-07-03 MED ORDER — SODIUM CHLORIDE 0.9 % IV SOLN
Freq: Once | INTRAVENOUS | Status: AC
  Administered 2018-07-03: 10:00:00 via INTRAVENOUS
  Filled 2018-07-03: qty 250

## 2018-07-03 NOTE — Progress Notes (Signed)
Patient here for pre treatment check. She has questions regarding a new prescription for Aromasin. She does not remember talking with the physician about this drug.
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
## 2018-07-23 ENCOUNTER — Inpatient Hospital Stay: Payer: 59 | Attending: Hospice and Palliative Medicine | Admitting: Hospice and Palliative Medicine

## 2018-07-23 DIAGNOSIS — Z5112 Encounter for antineoplastic immunotherapy: Secondary | ICD-10-CM | POA: Insufficient documentation

## 2018-07-23 DIAGNOSIS — C50411 Malignant neoplasm of upper-outer quadrant of right female breast: Secondary | ICD-10-CM | POA: Insufficient documentation

## 2018-07-23 DIAGNOSIS — I890 Lymphedema, not elsewhere classified: Secondary | ICD-10-CM | POA: Insufficient documentation

## 2018-07-24 ENCOUNTER — Inpatient Hospital Stay (HOSPITAL_BASED_OUTPATIENT_CLINIC_OR_DEPARTMENT_OTHER): Payer: 59 | Admitting: Oncology

## 2018-07-24 ENCOUNTER — Other Ambulatory Visit: Payer: Self-pay

## 2018-07-24 ENCOUNTER — Inpatient Hospital Stay: Payer: 59

## 2018-07-24 ENCOUNTER — Encounter: Payer: Self-pay | Admitting: Oncology

## 2018-07-24 VITALS — BP 109/71 | HR 71 | Temp 98.1°F | Resp 18 | Ht 63.0 in | Wt 173.0 lb

## 2018-07-24 DIAGNOSIS — I890 Lymphedema, not elsewhere classified: Secondary | ICD-10-CM | POA: Diagnosis not present

## 2018-07-24 DIAGNOSIS — C50411 Malignant neoplasm of upper-outer quadrant of right female breast: Secondary | ICD-10-CM

## 2018-07-24 DIAGNOSIS — Z95828 Presence of other vascular implants and grafts: Secondary | ICD-10-CM

## 2018-07-24 DIAGNOSIS — Z5112 Encounter for antineoplastic immunotherapy: Secondary | ICD-10-CM | POA: Diagnosis present

## 2018-07-24 LAB — CBC WITH DIFFERENTIAL/PLATELET
Abs Immature Granulocytes: 0.02 10*3/uL (ref 0.00–0.07)
Basophils Absolute: 0 10*3/uL (ref 0.0–0.1)
Basophils Relative: 1 %
Eosinophils Absolute: 0.3 10*3/uL (ref 0.0–0.5)
Eosinophils Relative: 5 %
HCT: 40.4 % (ref 36.0–46.0)
Hemoglobin: 13.2 g/dL (ref 12.0–15.0)
Immature Granulocytes: 0 %
LYMPHS ABS: 1.1 10*3/uL (ref 0.7–4.0)
LYMPHS PCT: 20 %
MCH: 31.1 pg (ref 26.0–34.0)
MCHC: 32.7 g/dL (ref 30.0–36.0)
MCV: 95.1 fL (ref 80.0–100.0)
Monocytes Absolute: 0.4 10*3/uL (ref 0.1–1.0)
Monocytes Relative: 6 %
NEUTROS PCT: 68 %
Neutro Abs: 3.8 10*3/uL (ref 1.7–7.7)
Platelets: 272 10*3/uL (ref 150–400)
RBC: 4.25 MIL/uL (ref 3.87–5.11)
RDW: 12.4 % (ref 11.5–15.5)
WBC: 5.6 10*3/uL (ref 4.0–10.5)
nRBC: 0 % (ref 0.0–0.2)

## 2018-07-24 LAB — COMPREHENSIVE METABOLIC PANEL
ALK PHOS: 70 U/L (ref 38–126)
ALT: 34 U/L (ref 0–44)
AST: 37 U/L (ref 15–41)
Albumin: 3.8 g/dL (ref 3.5–5.0)
Anion gap: 3 — ABNORMAL LOW (ref 5–15)
BUN: 14 mg/dL (ref 8–23)
CHLORIDE: 111 mmol/L (ref 98–111)
CO2: 23 mmol/L (ref 22–32)
Calcium: 8.8 mg/dL — ABNORMAL LOW (ref 8.9–10.3)
Creatinine, Ser: 0.65 mg/dL (ref 0.44–1.00)
GFR calc Af Amer: >60 mL/min (ref >60)
GFR calc non Af Amer: >60 mL/min (ref >60)
Glucose, Bld: 94 mg/dL (ref 70–99)
POTASSIUM: 3.5 mmol/L (ref 3.5–5.1)
Sodium: 137 mmol/L (ref 135–145)
Total Bilirubin: 0.5 mg/dL (ref 0.3–1.2)
Total Protein: 6.9 g/dL (ref 6.5–8.1)

## 2018-07-24 MED ORDER — SODIUM CHLORIDE 0.9% FLUSH
10.0000 mL | INTRAVENOUS | Status: DC | PRN
  Filled 2018-07-24: qty 10

## 2018-07-24 MED ORDER — LIDOCAINE-PRILOCAINE 2.5-2.5 % EX CREA: TOPICAL_CREAM | CUTANEOUS | 3 refills | Status: AC

## 2018-07-24 MED ORDER — LIDOCAINE-PRILOCAINE 2.5-2.5 % EX CREA: TOPICAL_CREAM | CUTANEOUS | 3 refills | Status: DC

## 2018-07-24 MED ORDER — HEPARIN SOD (PORK) LOCK FLUSH 100 UNIT/ML IV SOLN
500.0000 [IU] | Freq: Once | INTRAVENOUS | Status: DC | PRN
  Filled 2018-07-24: qty 5

## 2018-07-24 MED ORDER — SODIUM CHLORIDE 0.9 % IV SOLN
260.0000 mg | Freq: Once | INTRAVENOUS | Status: AC
  Administered 2018-07-24: 260 mg via INTRAVENOUS
  Filled 2018-07-24: qty 8

## 2018-07-24 MED ORDER — ACETAMINOPHEN 325 MG PO TABS
650.0000 mg | ORAL_TABLET | Freq: Once | ORAL | Status: AC
  Administered 2018-07-24: 650 mg via ORAL
  Filled 2018-07-24: qty 2

## 2018-07-24 MED ORDER — DIPHENHYDRAMINE HCL 25 MG PO CAPS
25.0000 mg | ORAL_CAPSULE | Freq: Once | ORAL | Status: AC
  Administered 2018-07-24: 25 mg via ORAL
  Filled 2018-07-24: qty 1

## 2018-07-24 MED ORDER — SODIUM CHLORIDE 0.9 % IV SOLN
Freq: Once | INTRAVENOUS | Status: AC
  Administered 2018-07-24: 10:00:00 via INTRAVENOUS
  Filled 2018-07-24: qty 250

## 2018-07-24 MED ORDER — SODIUM CHLORIDE 0.9% FLUSH
10.0000 mL | Freq: Once | INTRAVENOUS | Status: AC
  Administered 2018-07-24: 10 mL via INTRAVENOUS
  Filled 2018-07-24: qty 10

## 2018-07-29 DIAGNOSIS — E034 Atrophy of thyroid (acquired): Secondary | ICD-10-CM | POA: Diagnosis not present

## 2018-07-29 DIAGNOSIS — M5136 Other intervertebral disc degeneration, lumbar region: Secondary | ICD-10-CM | POA: Diagnosis not present

## 2018-07-29 DIAGNOSIS — I471 Supraventricular tachycardia: Secondary | ICD-10-CM | POA: Diagnosis not present
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
## 2018-08-11 ENCOUNTER — Ambulatory Visit: Payer: 59

## 2018-08-11 DIAGNOSIS — M5136 Other intervertebral disc degeneration, lumbar region: Secondary | ICD-10-CM | POA: Diagnosis not present

## 2018-08-11 DIAGNOSIS — E7849 Other hyperlipidemia: Secondary | ICD-10-CM | POA: Diagnosis not present

## 2018-08-11 DIAGNOSIS — C50411 Malignant neoplasm of upper-outer quadrant of right female breast: Secondary | ICD-10-CM | POA: Diagnosis not present

## 2018-08-11 DIAGNOSIS — E034 Atrophy of thyroid (acquired): Secondary | ICD-10-CM | POA: Diagnosis not present

## 2018-08-11 DIAGNOSIS — I471 Supraventricular tachycardia: Secondary | ICD-10-CM | POA: Diagnosis not present

## 2018-08-12 ENCOUNTER — Ambulatory Visit
Admission: RE | Admit: 2018-08-12 | Discharge: 2018-08-12 | Disposition: A | Discharge status: Home / Self Care | Payer: 59 | Source: Ambulatory Visit | Attending: Oncology | Admitting: Oncology

## 2018-08-12 DIAGNOSIS — C50411 Malignant neoplasm of upper-outer quadrant of right female breast: Secondary | ICD-10-CM | POA: Diagnosis present

## 2018-08-12 DIAGNOSIS — I351 Nonrheumatic aortic (valve) insufficiency: Secondary | ICD-10-CM | POA: Insufficient documentation

## 2018-08-12 DIAGNOSIS — I319 Disease of pericardium, unspecified: Secondary | ICD-10-CM | POA: Diagnosis not present

## 2018-08-12 DIAGNOSIS — E785 Hyperlipidemia, unspecified: Secondary | ICD-10-CM | POA: Insufficient documentation

## 2018-08-12 DIAGNOSIS — Z923 Personal history of irradiation: Secondary | ICD-10-CM | POA: Insufficient documentation

## 2018-08-12 DIAGNOSIS — Z9221 Personal history of antineoplastic chemotherapy: Secondary | ICD-10-CM | POA: Diagnosis not present

## 2018-08-12 DIAGNOSIS — J90 Pleural effusion, not elsewhere classified: Secondary | ICD-10-CM | POA: Diagnosis not present

## 2018-08-12 NOTE — Progress Notes (Signed)
*  PRELIMINARY RESULTS* Echocardiogram 2D Echocardiogram has been performed.  Julie Irwin 08/12/2018, 12:02 PM

## 2018-08-14 ENCOUNTER — Encounter: Payer: Self-pay | Admitting: *Deleted

## 2018-08-14 ENCOUNTER — Inpatient Hospital Stay: Payer: No Typology Code available for payment source | Attending: Oncology | Admitting: Oncology

## 2018-08-14 ENCOUNTER — Inpatient Hospital Stay: Payer: No Typology Code available for payment source

## 2018-08-14 ENCOUNTER — Other Ambulatory Visit: Payer: Self-pay

## 2018-08-14 VITALS — BP 105/70 | HR 66 | Temp 97.8°F | Ht 63.0 in | Wt 173.0 lb

## 2018-08-14 DIAGNOSIS — Z5112 Encounter for antineoplastic immunotherapy: Secondary | ICD-10-CM | POA: Insufficient documentation

## 2018-08-14 DIAGNOSIS — C50411 Malignant neoplasm of upper-outer quadrant of right female breast: Secondary | ICD-10-CM | POA: Diagnosis present

## 2018-08-14 MED ORDER — SODIUM CHLORIDE 0.9 % IV SOLN
Freq: Once | INTRAVENOUS | Status: AC
  Administered 2018-08-14: 10:00:00 via INTRAVENOUS
  Filled 2018-08-14: qty 250

## 2018-08-14 MED ORDER — ACETAMINOPHEN 325 MG PO TABS
650.0000 mg | ORAL_TABLET | Freq: Once | ORAL | Status: AC
  Administered 2018-08-14: 650 mg via ORAL
  Filled 2018-08-14: qty 2

## 2018-08-14 MED ORDER — HEPARIN SOD (PORK) LOCK FLUSH 100 UNIT/ML IV SOLN
500.0000 [IU] | Freq: Once | INTRAVENOUS | Status: AC | PRN
  Administered 2018-08-14: 500 [IU]
  Filled 2018-08-14: qty 5

## 2018-08-14 MED ORDER — DIPHENHYDRAMINE HCL 25 MG PO CAPS
25.0000 mg | ORAL_CAPSULE | Freq: Once | ORAL | Status: AC
  Administered 2018-08-14: 25 mg via ORAL
  Filled 2018-08-14: qty 1

## 2018-08-14 MED ORDER — SODIUM CHLORIDE 0.9 % IV SOLN
260.0000 mg | Freq: Once | INTRAVENOUS | Status: AC
  Administered 2018-08-14: 260 mg via INTRAVENOUS
  Filled 2018-08-14: qty 5

## 2018-08-14 NOTE — Progress Notes (Signed)
Patient is here today to follow up on her left breast cancer. Patient stated that she had been doing well.

## 2018-08-14 NOTE — Progress Notes (Signed)
Per Dr. Grayland Ormond, lab results obtained from [facility] and okay to proceed with treatment. Echo 08/12/18 EF 45-50%, per Dr. Grayland Ormond okay to proceed with treatment.

## 2018-08-25 ENCOUNTER — Ambulatory Visit: Payer: 59 | Admitting: Hospice and Palliative Medicine
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
## 2018-09-01 ENCOUNTER — Other Ambulatory Visit: Payer: Self-pay | Admitting: Oncology

## 2018-09-02 ENCOUNTER — Encounter: Payer: Self-pay | Admitting: Oncology

## 2018-09-03 ENCOUNTER — Ambulatory Visit: Payer: Self-pay

## 2018-09-03 ENCOUNTER — Ambulatory Visit: Payer: Self-pay | Admitting: Oncology

## 2018-09-03 ENCOUNTER — Encounter: Payer: Self-pay | Admitting: Oncology

## 2018-09-04 ENCOUNTER — Encounter: Payer: Self-pay | Admitting: Oncology

## 2018-09-04 ENCOUNTER — Inpatient Hospital Stay: Payer: No Typology Code available for payment source | Admitting: Oncology

## 2018-09-04 ENCOUNTER — Inpatient Hospital Stay: Payer: No Typology Code available for payment source

## 2018-09-04 VITALS — BP 107/66 | HR 71 | Temp 97.1°F | Resp 18 | Wt 174.0 lb

## 2018-09-04 DIAGNOSIS — C50411 Malignant neoplasm of upper-outer quadrant of right female breast: Secondary | ICD-10-CM | POA: Diagnosis not present

## 2018-09-04 DIAGNOSIS — Z5112 Encounter for antineoplastic immunotherapy: Secondary | ICD-10-CM | POA: Diagnosis not present

## 2018-09-04 MED ORDER — SODIUM CHLORIDE 0.9 % IV SOLN
Freq: Once | INTRAVENOUS | Status: AC
  Administered 2018-09-04: 09:00:00 via INTRAVENOUS
  Filled 2018-09-04: qty 250

## 2018-09-04 MED ORDER — SODIUM CHLORIDE 0.9 % IV SOLN
260.0000 mg | Freq: Once | INTRAVENOUS | Status: AC
  Administered 2018-09-04: 260 mg via INTRAVENOUS
  Filled 2018-09-04: qty 8

## 2018-09-04 MED ORDER — HEPARIN SOD (PORK) LOCK FLUSH 100 UNIT/ML IV SOLN
500.0000 [IU] | Freq: Once | INTRAVENOUS | Status: AC
  Administered 2018-09-04: 500 [IU] via INTRAVENOUS
  Filled 2018-09-04: qty 5

## 2018-09-04 MED ORDER — DIPHENHYDRAMINE HCL 25 MG PO CAPS
25.0000 mg | ORAL_CAPSULE | Freq: Once | ORAL | Status: AC
  Administered 2018-09-04: 25 mg via ORAL
  Filled 2018-09-04: qty 1

## 2018-09-04 MED ORDER — SODIUM CHLORIDE 0.9% FLUSH
10.0000 mL | INTRAVENOUS | Status: DC | PRN
  Administered 2018-09-04: 10 mL via INTRAVENOUS
  Filled 2018-09-04: qty 10

## 2018-09-04 MED ORDER — ACETAMINOPHEN 325 MG PO TABS
650.0000 mg | ORAL_TABLET | Freq: Once | ORAL | Status: AC
  Administered 2018-09-04: 650 mg via ORAL
  Filled 2018-09-04: qty 2

## 2018-09-17 ENCOUNTER — Other Ambulatory Visit: Payer: Self-pay | Admitting: *Deleted

## 2018-09-17 DIAGNOSIS — C50919 Malignant neoplasm of unspecified site of unspecified female breast: Secondary | ICD-10-CM
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
## 2018-09-22 ENCOUNTER — Ambulatory Visit
Admission: RE | Admit: 2018-09-22 | Discharge: 2018-09-22 | Disposition: A | Discharge status: Home / Self Care | Payer: 59 | Source: Ambulatory Visit | Attending: Oncology | Admitting: Oncology

## 2018-09-22 DIAGNOSIS — Z9221 Personal history of antineoplastic chemotherapy: Secondary | ICD-10-CM | POA: Diagnosis not present

## 2018-09-22 DIAGNOSIS — F419 Anxiety disorder, unspecified: Secondary | ICD-10-CM | POA: Insufficient documentation

## 2018-09-22 DIAGNOSIS — I071 Rheumatic tricuspid insufficiency: Secondary | ICD-10-CM | POA: Insufficient documentation

## 2018-09-22 DIAGNOSIS — D649 Anemia, unspecified: Secondary | ICD-10-CM | POA: Insufficient documentation

## 2018-09-22 DIAGNOSIS — Z923 Personal history of irradiation: Secondary | ICD-10-CM | POA: Diagnosis not present

## 2018-09-22 DIAGNOSIS — C50411 Malignant neoplasm of upper-outer quadrant of right female breast: Secondary | ICD-10-CM | POA: Diagnosis not present

## 2018-09-22 DIAGNOSIS — E039 Hypothyroidism, unspecified: Secondary | ICD-10-CM | POA: Diagnosis not present

## 2018-09-22 DIAGNOSIS — J90 Pleural effusion, not elsewhere classified: Secondary | ICD-10-CM | POA: Insufficient documentation

## 2018-09-22 NOTE — Progress Notes (Signed)
*  PRELIMINARY RESULTS* Echocardiogram 2D Echocardiogram has been performed.  Julie Irwin 09/22/2018, 10:59 AM

## 2018-09-23 ENCOUNTER — Encounter: Payer: Self-pay | Admitting: Oncology

## 2018-09-25 ENCOUNTER — Inpatient Hospital Stay: Payer: No Typology Code available for payment source

## 2018-09-25 ENCOUNTER — Other Ambulatory Visit: Payer: Self-pay

## 2018-09-25 ENCOUNTER — Inpatient Hospital Stay: Payer: No Typology Code available for payment source | Attending: Oncology | Admitting: Oncology

## 2018-09-25 VITALS — BP 97/68 | HR 73 | Temp 97.8°F | Ht 63.0 in | Wt 171.0 lb

## 2018-09-25 DIAGNOSIS — R531 Weakness: Secondary | ICD-10-CM

## 2018-09-25 DIAGNOSIS — Z5112 Encounter for antineoplastic immunotherapy: Secondary | ICD-10-CM | POA: Diagnosis present

## 2018-09-25 DIAGNOSIS — C50411 Malignant neoplasm of upper-outer quadrant of right female breast: Secondary | ICD-10-CM | POA: Insufficient documentation

## 2018-09-25 DIAGNOSIS — R5382 Chronic fatigue, unspecified: Secondary | ICD-10-CM | POA: Diagnosis not present

## 2018-09-25 DIAGNOSIS — C50919 Malignant neoplasm of unspecified site of unspecified female breast: Secondary | ICD-10-CM

## 2018-09-25 MED ORDER — DIPHENHYDRAMINE HCL 25 MG PO CAPS
25.0000 mg | ORAL_CAPSULE | Freq: Once | ORAL | Status: AC
  Administered 2018-09-25: 25 mg via ORAL
  Filled 2018-09-25: qty 1

## 2018-09-25 MED ORDER — SODIUM CHLORIDE 0.9 % IV SOLN
Freq: Once | INTRAVENOUS | Status: AC
  Administered 2018-09-25: 11:00:00 via INTRAVENOUS
  Filled 2018-09-25: qty 250

## 2018-09-25 MED ORDER — SODIUM CHLORIDE 0.9% FLUSH
10.0000 mL | INTRAVENOUS | Status: DC | PRN
  Administered 2018-09-25: 10 mL
  Filled 2018-09-25: qty 10

## 2018-09-25 MED ORDER — SODIUM CHLORIDE 0.9 % IV SOLN
260.0000 mg | Freq: Once | INTRAVENOUS | Status: AC
  Administered 2018-09-25: 260 mg via INTRAVENOUS
  Filled 2018-09-25: qty 13

## 2018-09-25 MED ORDER — ACETAMINOPHEN 325 MG PO TABS
650.0000 mg | ORAL_TABLET | Freq: Once | ORAL | Status: AC
  Administered 2018-09-25: 650 mg via ORAL
  Filled 2018-09-25: qty 2

## 2018-09-25 MED ORDER — HEPARIN SOD (PORK) LOCK FLUSH 100 UNIT/ML IV SOLN
500.0000 [IU] | Freq: Once | INTRAVENOUS | Status: AC | PRN
  Administered 2018-09-25: 500 [IU]
  Filled 2018-09-25: qty 5

## 2018-09-25 NOTE — Progress Notes (Signed)
Patient is here today to follow up on her primary cancer of upper outer quadrant of right. Patient stated that she has had four UTI's but feeling better since she is currently taking antibiotics. Patient also stated that on Saturday (09/20/2018)she ate steak and around 3 AM, she started to feel sick and vomiting. Afterwards she continue to feel weak and tired with weakness on her arms and with chills. The next day she felt weak but better.

## 2018-10-10 ENCOUNTER — Encounter: Payer: Self-pay | Admitting: Nurse Practitioner

## 2018-10-10 ENCOUNTER — Inpatient Hospital Stay (HOSPITAL_BASED_OUTPATIENT_CLINIC_OR_DEPARTMENT_OTHER): Payer: No Typology Code available for payment source | Admitting: Nurse Practitioner

## 2018-10-10 ENCOUNTER — Other Ambulatory Visit: Payer: Self-pay

## 2018-10-10 ENCOUNTER — Telehealth: Payer: Self-pay | Admitting: *Deleted

## 2018-10-10 VITALS — BP 123/83 | HR 79 | Temp 99.4°F | Resp 16

## 2018-10-10 DIAGNOSIS — N200 Calculus of kidney: Secondary | ICD-10-CM

## 2018-10-10 DIAGNOSIS — C50411 Malignant neoplasm of upper-outer quadrant of right female breast: Secondary | ICD-10-CM

## 2018-10-10 DIAGNOSIS — N390 Urinary tract infection, site not specified: Secondary | ICD-10-CM

## 2018-10-10 DIAGNOSIS — C7931 Secondary malignant neoplasm of brain: Secondary | ICD-10-CM

## 2018-10-10 DIAGNOSIS — R300 Dysuria: Secondary | ICD-10-CM

## 2018-10-10 DIAGNOSIS — Z5112 Encounter for antineoplastic immunotherapy: Secondary | ICD-10-CM | POA: Diagnosis not present

## 2018-10-10 LAB — URINALYSIS, COMPLETE (UACMP) WITH MICROSCOPIC
Bilirubin Urine: NEGATIVE
Glucose, UA: NEGATIVE mg/dL
Ketones, ur: NEGATIVE mg/dL
Nitrite: POSITIVE — AB
Protein, ur: NEGATIVE mg/dL
RBC / HPF: >50 RBC/hpf — ABNORMAL HIGH (ref 0–5)
Specific Gravity, Urine: 1.008 (ref 1.005–1.030)
Squamous Epithelial / HPF: NONE SEEN (ref 0–5)
WBC, UA: >50 WBC/hpf — ABNORMAL HIGH (ref 0–5)
pH: 7 (ref 5.0–8.0)

## 2018-10-10 MED ORDER — PHENAZOPYRIDINE HCL 100 MG PO TABS: 100.0000 mg | ORAL_TABLET | Freq: Three times a day (TID) | ORAL | 0 refills | Status: DC | PRN

## 2018-10-10 MED ORDER — FOSFOMYCIN TROMETHAMINE 3 G PO PACK: 3.0000 g | PACK | Freq: Once | ORAL | 0 refills | Status: AC

## 2018-10-10 NOTE — Telephone Encounter (Signed)
Spoke to patient via telephone about her UTI symptoms. She is experiencing frequency and dysuria. She stated that she has had multiple UTI's in the past. She wanted [place] to just call her in an antibiotic because she has a hard time getting off work. Explained to her that it is important that we check her urine and see what it grows so we can treat it properly, and also to evaluate her for recurrent UTI. Patient accepted appointment in the symptom management clinic today at 11:30.      dhs
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
## 2018-10-12 LAB — URINE CULTURE: Culture: >=100000 — AB
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
## 2018-10-14 ENCOUNTER — Encounter: Payer: Self-pay | Admitting: Oncology

## 2018-10-15 ENCOUNTER — Ambulatory Visit
Admission: RE | Admit: 2018-10-15 | Discharge: 2018-10-15 | Disposition: A | Discharge status: Home / Self Care | Payer: No Typology Code available for payment source | Source: Ambulatory Visit | Attending: Oncology | Admitting: Oncology

## 2018-10-15 ENCOUNTER — Ambulatory Visit: Admission: RE | Admit: 2018-10-15 | Payer: 59 | Source: Ambulatory Visit

## 2018-10-15 ENCOUNTER — Other Ambulatory Visit: Payer: Self-pay

## 2018-10-15 DIAGNOSIS — C50919 Malignant neoplasm of unspecified site of unspecified female breast: Secondary | ICD-10-CM | POA: Diagnosis present

## 2018-10-15 MED ORDER — IOHEXOL 300 MG/ML  SOLN
100.0000 mL | Freq: Once | INTRAMUSCULAR | Status: AC | PRN
  Administered 2018-10-15: 100 mL via INTRAVENOUS

## 2018-10-17 ENCOUNTER — Ambulatory Visit: Payer: Self-pay | Admitting: Oncology

## 2018-10-17 ENCOUNTER — Encounter: Payer: Self-pay | Admitting: Oncology

## 2018-10-17 ENCOUNTER — Encounter: Payer: Self-pay | Admitting: Nurse Practitioner

## 2018-10-17 ENCOUNTER — Ambulatory Visit: Payer: Self-pay

## 2018-10-17 ENCOUNTER — Inpatient Hospital Stay: Payer: No Typology Code available for payment source | Attending: Oncology

## 2018-10-17 ENCOUNTER — Other Ambulatory Visit: Payer: Self-pay

## 2018-10-17 ENCOUNTER — Inpatient Hospital Stay: Payer: No Typology Code available for payment source | Admitting: Oncology

## 2018-10-17 VITALS — BP 115/75 | HR 78 | Temp 97.8°F | Ht 63.0 in | Wt 171.5 lb

## 2018-10-17 DIAGNOSIS — Z5112 Encounter for antineoplastic immunotherapy: Secondary | ICD-10-CM | POA: Diagnosis present

## 2018-10-17 DIAGNOSIS — R5382 Chronic fatigue, unspecified: Secondary | ICD-10-CM

## 2018-10-17 DIAGNOSIS — R531 Weakness: Secondary | ICD-10-CM | POA: Diagnosis not present

## 2018-10-17 DIAGNOSIS — C50411 Malignant neoplasm of upper-outer quadrant of right female breast: Secondary | ICD-10-CM

## 2018-10-17 MED ORDER — ACETAMINOPHEN 325 MG PO TABS
650.0000 mg | ORAL_TABLET | Freq: Once | ORAL | Status: AC
  Administered 2018-10-17: 650 mg via ORAL
  Filled 2018-10-17: qty 2

## 2018-10-17 MED ORDER — SODIUM CHLORIDE 0.9% FLUSH
10.0000 mL | INTRAVENOUS | Status: DC | PRN
  Administered 2018-10-17: 10 mL
  Filled 2018-10-17: qty 10

## 2018-10-17 MED ORDER — SODIUM CHLORIDE 0.9 % IV SOLN
Freq: Once | INTRAVENOUS | Status: AC
  Administered 2018-10-17: 10:00:00 via INTRAVENOUS
  Filled 2018-10-17: qty 250

## 2018-10-17 MED ORDER — SODIUM CHLORIDE 0.9 % IV SOLN
3.3000 mg/kg | Freq: Once | INTRAVENOUS | Status: AC
  Administered 2018-10-17: 260 mg via INTRAVENOUS
  Filled 2018-10-17: qty 8

## 2018-10-17 MED ORDER — HEPARIN SOD (PORK) LOCK FLUSH 100 UNIT/ML IV SOLN
500.0000 [IU] | Freq: Once | INTRAVENOUS | Status: AC | PRN
  Administered 2018-10-17: 500 [IU]

## 2018-10-17 MED ORDER — DIPHENHYDRAMINE HCL 25 MG PO CAPS
25.0000 mg | ORAL_CAPSULE | Freq: Once | ORAL | Status: AC
  Administered 2018-10-17: 25 mg via ORAL
  Filled 2018-10-17: qty 1

## 2018-10-17 NOTE — Progress Notes (Signed)
Patient is here today to follow up on her Right breast cancer. Patient stated that she had been doing well with no complaints.

## 2018-10-20 ENCOUNTER — Other Ambulatory Visit: Payer: Self-pay

## 2018-10-20 ENCOUNTER — Inpatient Hospital Stay: Payer: No Typology Code available for payment source | Admitting: *Deleted

## 2018-10-20 ENCOUNTER — Telehealth: Payer: Self-pay | Admitting: *Deleted

## 2018-10-20 DIAGNOSIS — Z5112 Encounter for antineoplastic immunotherapy: Secondary | ICD-10-CM | POA: Diagnosis not present

## 2018-10-20 DIAGNOSIS — R300 Dysuria: Secondary | ICD-10-CM

## 2018-10-20 DIAGNOSIS — N390 Urinary tract infection, site not specified: Secondary | ICD-10-CM

## 2018-10-20 LAB — URINALYSIS, COMPLETE (UACMP) WITH MICROSCOPIC
Bilirubin Urine: NEGATIVE
Glucose, UA: NEGATIVE mg/dL
Ketones, ur: NEGATIVE mg/dL
Nitrite: POSITIVE — AB
Protein, ur: 30 mg/dL — AB
Specific Gravity, Urine: 1.009 (ref 1.005–1.030)
WBC, UA: >50 WBC/hpf — ABNORMAL HIGH (ref 0–5)
pH: 6 (ref 5.0–8.0)

## 2018-10-20 NOTE — Telephone Encounter (Signed)
Can you take care of this? I don't know what was prescribed last and it appears she still has a UTI. Urology appt is not until the end on March.

## 2018-10-20 NOTE — Telephone Encounter (Signed)
Julie Irwin called reporting that she has another UTI and that Lauren told her to call if she got another one, She request medicine for it. Please advise

## 2018-10-20 NOTE — Telephone Encounter (Signed)
[facility] 

## 2018-10-20 NOTE — Telephone Encounter (Signed)
Patient agrees to come in at 49 today

## 2018-10-20 NOTE — Telephone Encounter (Signed)
Per J burns,NP/ Lilian Kapur, physician patient to come in and give urine sample. Will decide after that what to do.

## 2018-10-21 ENCOUNTER — Telehealth: Payer: Self-pay | Admitting: *Deleted

## 2018-10-21 ENCOUNTER — Other Ambulatory Visit: Payer: Self-pay | Admitting: Nurse Practitioner

## 2018-10-21 MED ORDER — CEFDINIR 300 MG PO CAPS: 300.0000 mg | ORAL_CAPSULE | Freq: Two times a day (BID) | ORAL | 0 refills | Status: AC

## 2018-10-21 NOTE — Telephone Encounter (Signed)
Prescription for cefdinir sent to pharmacy. Could you please notify patient?

## 2018-10-21 NOTE — Telephone Encounter (Signed)
Patient informed. 

## 2018-10-21 NOTE — Progress Notes (Signed)
UA consistent with UTI. Awaiting referral to urology. Awaiting C&S results. Prescription for cefdinir sent to pharmacy.

## 2018-10-21 NOTE — Telephone Encounter (Signed)
Patient called asking for results and stating she is having burning and pain with urination. Please return her call

## 2018-10-21 NOTE — Progress Notes (Signed)
FYI

## 2018-10-22 LAB — URINE CULTURE: Culture: >=100000 — AB

## 2018-10-23 ENCOUNTER — Other Ambulatory Visit: Payer: Self-pay | Admitting: Oncology

## 2018-11-03 ENCOUNTER — Other Ambulatory Visit: Payer: Self-pay | Admitting: Oncology

## 2018-11-04 ENCOUNTER — Encounter: Payer: Self-pay | Admitting: Oncology

## 2018-11-05 ENCOUNTER — Other Ambulatory Visit: Payer: Self-pay | Admitting: Oncology

## 2018-11-05 ENCOUNTER — Other Ambulatory Visit: Payer: Self-pay

## 2018-11-06 ENCOUNTER — Inpatient Hospital Stay: Payer: No Typology Code available for payment source

## 2018-11-06 ENCOUNTER — Inpatient Hospital Stay (HOSPITAL_BASED_OUTPATIENT_CLINIC_OR_DEPARTMENT_OTHER): Payer: No Typology Code available for payment source | Admitting: Oncology

## 2018-11-06 ENCOUNTER — Other Ambulatory Visit: Payer: Self-pay

## 2018-11-06 ENCOUNTER — Encounter: Payer: Self-pay | Admitting: Oncology

## 2018-11-06 VITALS — BP 102/67 | HR 71 | Temp 98.2°F | Wt 172.4 lb

## 2018-11-06 DIAGNOSIS — R5382 Chronic fatigue, unspecified: Secondary | ICD-10-CM

## 2018-11-06 DIAGNOSIS — C50411 Malignant neoplasm of upper-outer quadrant of right female breast: Secondary | ICD-10-CM

## 2018-11-06 DIAGNOSIS — C50919 Malignant neoplasm of unspecified site of unspecified female breast: Secondary | ICD-10-CM

## 2018-11-06 DIAGNOSIS — Z5112 Encounter for antineoplastic immunotherapy: Secondary | ICD-10-CM | POA: Diagnosis not present

## 2018-11-06 MED ORDER — SODIUM CHLORIDE 0.9 % IV SOLN
Freq: Once | INTRAVENOUS | Status: AC
  Administered 2018-11-06: 10:00:00 via INTRAVENOUS
  Filled 2018-11-06: qty 250

## 2018-11-06 MED ORDER — SODIUM CHLORIDE 0.9 % IV SOLN
3.3000 mg/kg | Freq: Once | INTRAVENOUS | Status: AC
  Administered 2018-11-06: 260 mg via INTRAVENOUS
  Filled 2018-11-06: qty 8

## 2018-11-06 MED ORDER — DIPHENHYDRAMINE HCL 25 MG PO CAPS
25.0000 mg | ORAL_CAPSULE | Freq: Once | ORAL | Status: AC
  Administered 2018-11-06: 25 mg via ORAL
  Filled 2018-11-06: qty 1

## 2018-11-06 MED ORDER — SODIUM CHLORIDE 0.9% FLUSH
10.0000 mL | INTRAVENOUS | Status: DC | PRN
  Administered 2018-11-06: 10 mL
  Filled 2018-11-06: qty 10

## 2018-11-06 MED ORDER — HEPARIN SOD (PORK) LOCK FLUSH 100 UNIT/ML IV SOLN
500.0000 [IU] | Freq: Once | INTRAVENOUS | Status: AC | PRN
  Administered 2018-11-06: 500 [IU]
  Filled 2018-11-06: qty 5

## 2018-11-06 MED ORDER — ACETAMINOPHEN 325 MG PO TABS
650.0000 mg | ORAL_TABLET | Freq: Once | ORAL | Status: AC
  Administered 2018-11-06: 650 mg via ORAL
  Filled 2018-11-06: qty 2
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
## 2018-11-06 NOTE — Progress Notes (Signed)
Patient here today for follow up and chemotherapy.  Patient c/o some occasional neuropathy in hands, usually only about 1x daily.

## 2018-11-10 ENCOUNTER — Ambulatory Visit: Payer: Self-pay | Admitting: Urology

## 2018-11-12 ENCOUNTER — Other Ambulatory Visit: Payer: Self-pay

## 2018-11-12 ENCOUNTER — Encounter: Payer: Self-pay | Admitting: Urology

## 2018-11-12 ENCOUNTER — Ambulatory Visit: Payer: 59 | Admitting: Urology

## 2018-11-12 VITALS — BP 142/85 | HR 93 | Ht 63.0 in | Wt 170.0 lb

## 2018-11-12 DIAGNOSIS — N390 Urinary tract infection, site not specified: Secondary | ICD-10-CM | POA: Diagnosis not present

## 2018-11-12 LAB — MICROSCOPIC EXAMINATION

## 2018-11-12 LAB — URINALYSIS, COMPLETE
Bilirubin, UA: NEGATIVE
Glucose, UA: NEGATIVE
Ketones, UA: NEGATIVE
Nitrite, UA: NEGATIVE
Protein,UA: NEGATIVE
Specific Gravity, UA: 1.01 (ref 1.005–1.030)
Urobilinogen, Ur: 0.2 mg/dL (ref 0.2–1.0)
pH, UA: 5.5 (ref 5.0–7.5)

## 2018-11-12 LAB — BLADDER SCAN AMB NON-IMAGING

## 2018-11-12 MED ORDER — CIPROFLOXACIN HCL 500 MG PO TABS: 500.0000 mg | ORAL_TABLET | Freq: Two times a day (BID) | ORAL | 0 refills | Status: DC

## 2018-11-12 MED ORDER — CIPROFLOXACIN IN D5W 400 MG/200ML IV SOLN
400.0000 mg | Freq: Once | INTRAVENOUS | Status: AC
  Administered 2018-11-13: 400 mg via INTRAVENOUS
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
## 2018-11-12 NOTE — Patient Instructions (Signed)
Tips for tolerating your ureteral stent after your urologic procedure Your urologist placed a stent into your ureter (the tube that connects your kidney to your bladder). This ureteral stent functions much like a tiny straw that has as small coil in the kidney and in the bladder to help hold it in place and to keep your ureter open until the healing process is complete. If the stent is removed prior to the resolution of swelling, you may experience significant discomfort. This pain is very similar to the pain experienced with a kidney or ureteral stone. Therefore, we urge you to leave the stent in for the prescribed amount of time.  To improve the tolerability of the stent, several medications are available.   This is a medication that helps relieve bladder cramps and spasms often experienced with a ureteral stent.    Frequently Asked Questions about Ureteral Stents: Q Is it normal to have discomfort present? A Yes. In fact, certain movements may increase discomfort. Examples include: frequent bending and increased activity. Q Is it normal to feel like I have to urinate more often? A Yes,  Q Is it normal to feel pain or pressure before and/or during urination? A Yes, some patients have significant pain in the back during urination because the stent allows urine to flow back toward the kidney. Others may experience pain in the bladder because the stent may cause a bladder cramp during or at the end of urination. Q Is it normal to have blood in my urine and what if it is not present all the time? A Yes. The stent will cause some irritation to tissue, it may be present while you have the stent and after you remove the stent. The blood may be a light red or darker at times. Don't be alarmed if the blood resolves and then returns. This is typical for most patients  Ureteroscopy Ureteroscopy is a procedure to check for and treat problems inside part of the urinary tract. In this procedure, a  thin, tube-shaped instrument with a light at the end (ureteroscope) is used to look at the inside of the kidneys and the ureters, which are the tubes that carry urine from the kidneys to the bladder. The ureteroscope is inserted into one or both of the ureters. You may need this procedure if you have frequent urinary tract infections (UTIs), blood in your urine, or a stone in one of your ureters. A ureteroscopy can be done to find the cause of urine blockage in a ureter and to evaluate other abnormalities inside the ureters or kidneys. If stones are found, they can be removed during the procedure. Polyps, abnormal tissue, and some types of tumors can also be removed or treated. The ureteroscope may also have a tool to remove tissue to be checked for disease under a microscope (biopsy). Tell a health care provider about:  Any allergies you have.  All medicines you are taking, including vitamins, herbs, eye drops, creams, and over-the-counter medicines.  Any problems you or family members have had with anesthetic medicines.  Any blood disorders you have.  Any surgeries you have had.  Any medical conditions you have.  Whether you are pregnant or may be pregnant. What are the risks? Generally, this is a safe procedure. However, problems may occur, including:  Bleeding.  Infection.  Allergic reactions to medicines.  Scarring that narrows the ureter (stricture).  Creating a hole in the ureter (perforation). What happens before the procedure? Staying hydrated Follow instructions from  your health care provider about hydration, which may include:  Up to 2 hours before the procedure - you may continue to drink clear liquids, such as water, clear fruit juice, black coffee, and plain tea. Eating and drinking restrictions Follow instructions from your health care provider about eating and drinking, which may include:  8 hours before the procedure - stop eating heavy meals or foods such as  meat, fried foods, or fatty foods.  6 hours before the procedure - stop eating light meals or foods, such as toast or cereal.  6 hours before the procedure - stop drinking milk or drinks that contain milk.  2 hours before the procedure - stop drinking clear liquids. Medicines  Ask your health care provider about: ? Changing or stopping your regular medicines. This is especially important if you are taking diabetes medicines or blood thinners. ? Taking medicines such as aspirin and ibuprofen. These medicines can thin your blood. Do not take these medicines before your procedure if your health care provider instructs you not to.  You may be given antibiotic medicine to help prevent infection. General instructions  You may have a urine sample taken to check for infection.  Plan to have someone take you home from the hospital or clinic. What happens during the procedure?   To reduce your risk of infection: ? Your health care team will wash or sanitize their hands. ? Your skin will be washed with soap.  An IV tube will be inserted into one of your veins.  You will be given one of the following: ? A medicine to help you relax (sedative). ? A medicine to make you fall asleep (general anesthetic). ? A medicine that is injected into your spine to numb the area below and slightly above the injection site (spinal anesthetic).  To lower your risk of infection, you may be given an antibiotic medicine by an injection or through the IV tube.  The opening from which you urinate (urethra) will be cleaned with a germ-killing solution.  The ureteroscope will be passed through your urethra into your bladder.  A salt-water solution will flow through the ureteroscope to fill your bladder. This will help the health care provider see the openings of your ureters more clearly.  Then, the ureteroscope will be passed into your ureter. ? If a growth is found, a piece of it may be removed so it can be  examined under a microscope (biopsy). ? If a stone is found, it may be removed through the ureteroscope, or the stone may be broken up using a laser, shock waves, or electrical energy. ? In some cases, if the ureter is too small, a tube may be inserted that keeps the ureter open (ureteral stent). The stent may be left in place for 1 or 2 weeks to keep the ureter open, and then the ureteroscopy procedure will be performed.  The scope will be removed, and your bladder will be emptied. The procedure may vary among health care providers and hospitals. What happens after the procedure?  Your blood pressure, heart rate, breathing rate, and blood oxygen level will be monitored until the medicines you were given have worn off.  You may be asked to urinate.  Donot drive for 24 hours if you were given a sedative. This information is not intended to replace advice given to you by your health care provider. Make sure you discuss any questions you have with your health care provider. Document Released: 08/04/2013 Document Revised: 05/15/2016  Document Reviewed: 05/11/2016 Elsevier Interactive Patient Education  [employer].

## 2018-11-13 ENCOUNTER — Other Ambulatory Visit: Payer: Self-pay

## 2018-11-13 ENCOUNTER — Encounter
Admission: RE | Disposition: A | Discharge status: Home / Self Care | Payer: Self-pay | Source: Home / Self Care | Attending: Urology

## 2018-11-13 ENCOUNTER — Ambulatory Visit: Payer: No Typology Code available for payment source | Admitting: Anesthesiology

## 2018-11-13 ENCOUNTER — Ambulatory Visit
Admission: RE | Admit: 2018-11-13 | Discharge: 2018-11-13 | Disposition: A | Discharge status: Home / Self Care | Payer: No Typology Code available for payment source | Attending: Urology | Admitting: Urology

## 2018-11-13 DIAGNOSIS — Z87440 Personal history of urinary (tract) infections: Secondary | ICD-10-CM | POA: Insufficient documentation

## 2018-11-13 DIAGNOSIS — Z923 Personal history of irradiation: Secondary | ICD-10-CM | POA: Insufficient documentation

## 2018-11-13 DIAGNOSIS — Z823 Family history of stroke: Secondary | ICD-10-CM | POA: Insufficient documentation

## 2018-11-13 DIAGNOSIS — F419 Anxiety disorder, unspecified: Secondary | ICD-10-CM | POA: Insufficient documentation

## 2018-11-13 DIAGNOSIS — Z9221 Personal history of antineoplastic chemotherapy: Secondary | ICD-10-CM | POA: Diagnosis not present

## 2018-11-13 DIAGNOSIS — Z86718 Personal history of other venous thrombosis and embolism: Secondary | ICD-10-CM | POA: Insufficient documentation

## 2018-11-13 DIAGNOSIS — N132 Hydronephrosis with renal and ureteral calculous obstruction: Secondary | ICD-10-CM | POA: Insufficient documentation

## 2018-11-13 DIAGNOSIS — R51 Headache: Secondary | ICD-10-CM | POA: Diagnosis not present

## 2018-11-13 DIAGNOSIS — C50919 Malignant neoplasm of unspecified site of unspecified female breast: Secondary | ICD-10-CM | POA: Diagnosis not present

## 2018-11-13 DIAGNOSIS — Z885 Allergy status to narcotic agent status: Secondary | ICD-10-CM | POA: Insufficient documentation

## 2018-11-13 DIAGNOSIS — K5790 Diverticulosis of intestine, part unspecified, without perforation or abscess without bleeding: Secondary | ICD-10-CM | POA: Diagnosis not present

## 2018-11-13 DIAGNOSIS — Z8371 Family history of colonic polyps: Secondary | ICD-10-CM | POA: Insufficient documentation

## 2018-11-13 DIAGNOSIS — Z6830 Body mass index (BMI) 30.0-30.9, adult: Secondary | ICD-10-CM | POA: Diagnosis not present

## 2018-11-13 DIAGNOSIS — E785 Hyperlipidemia, unspecified: Secondary | ICD-10-CM | POA: Insufficient documentation

## 2018-11-13 DIAGNOSIS — E039 Hypothyroidism, unspecified: Secondary | ICD-10-CM | POA: Diagnosis not present

## 2018-11-13 DIAGNOSIS — M1990 Unspecified osteoarthritis, unspecified site: Secondary | ICD-10-CM | POA: Insufficient documentation

## 2018-11-13 DIAGNOSIS — Z90710 Acquired absence of both cervix and uterus: Secondary | ICD-10-CM | POA: Insufficient documentation

## 2018-11-13 DIAGNOSIS — Z9109 Other allergy status, other than to drugs and biological substances: Secondary | ICD-10-CM | POA: Insufficient documentation

## 2018-11-13 DIAGNOSIS — Z825 Family history of asthma and other chronic lower respiratory diseases: Secondary | ICD-10-CM | POA: Insufficient documentation

## 2018-11-13 DIAGNOSIS — Z882 Allergy status to sulfonamides status: Secondary | ICD-10-CM | POA: Insufficient documentation

## 2018-11-13 DIAGNOSIS — F329 Major depressive disorder, single episode, unspecified: Secondary | ICD-10-CM | POA: Diagnosis not present

## 2018-11-13 DIAGNOSIS — M719 Bursopathy, unspecified: Secondary | ICD-10-CM | POA: Insufficient documentation

## 2018-11-13 DIAGNOSIS — D649 Anemia, unspecified: Secondary | ICD-10-CM | POA: Insufficient documentation

## 2018-11-13 DIAGNOSIS — J45909 Unspecified asthma, uncomplicated: Secondary | ICD-10-CM | POA: Diagnosis not present

## 2018-11-13 HISTORY — PX: CYSTOSCOPY/URETEROSCOPY/HOLMIUM LASER/STENT PLACEMENT: SHX6546

## 2018-11-13 SURGERY — CYSTOSCOPY/URETEROSCOPY/HOLMIUM LASER/STENT PLACEMENT
Anesthesia: General | Laterality: Left

## 2018-11-13 MED ORDER — FENTANYL CITRATE (PF) 100 MCG/2ML IJ SOLN
INTRAMUSCULAR | Status: DC | PRN
  Administered 2018-11-13: 50 ug via INTRAVENOUS

## 2018-11-13 MED ORDER — LACTATED RINGERS IV SOLN
INTRAVENOUS | Status: DC
  Administered 2018-11-13: 08:00:00 via INTRAVENOUS
  Administered 2018-11-13: 08:00:00 1000 mL via INTRAVENOUS

## 2018-11-13 MED ORDER — ROCURONIUM BROMIDE 100 MG/10ML IV SOLN
INTRAVENOUS | Status: DC | PRN
  Administered 2018-11-13 (×2): 50 mg via INTRAVENOUS

## 2018-11-13 MED ORDER — PROPOFOL 10 MG/ML IV BOLUS
INTRAVENOUS | Status: DC | PRN
  Administered 2018-11-13: 130 mg via INTRAVENOUS

## 2018-11-13 MED ORDER — ONDANSETRON HCL 4 MG/2ML IJ SOLN
INTRAMUSCULAR | Status: DC | PRN
  Administered 2018-11-13: 4 mg via INTRAVENOUS

## 2018-11-13 MED ORDER — HEPARIN SOD (PORK) LOCK FLUSH 100 UNIT/ML IV SOLN
INTRAVENOUS | Status: AC
  Filled 2018-11-13: qty 5

## 2018-11-13 MED ORDER — IOHEXOL 180 MG/ML  SOLN
INTRAMUSCULAR | Status: DC | PRN
  Administered 2018-11-13: 5 mL via INTRAVENOUS

## 2018-11-13 MED ORDER — KETOROLAC TROMETHAMINE 30 MG/ML IJ SOLN
INTRAMUSCULAR | Status: DC | PRN
  Administered 2018-11-13: 15 mg via INTRAVENOUS

## 2018-11-13 MED ORDER — HEPARIN SODIUM (PORCINE) 5000 UNIT/ML IJ SOLN
5000.0000 [IU] | Freq: Once | INTRAMUSCULAR | Status: AC
  Administered 2018-11-13: 5000 [IU] via SUBCUTANEOUS

## 2018-11-13 MED ORDER — LIDOCAINE HCL (PF) 2 % IJ SOLN
INTRAMUSCULAR | Status: AC
  Filled 2018-11-13: qty 10

## 2018-11-13 MED ORDER — FENTANYL CITRATE (PF) 100 MCG/2ML IJ SOLN
INTRAMUSCULAR | Status: AC
  Filled 2018-11-13: qty 2

## 2018-11-13 MED ORDER — KETOROLAC TROMETHAMINE 30 MG/ML IJ SOLN
INTRAMUSCULAR | Status: AC
  Filled 2018-11-13: qty 1

## 2018-11-13 MED ORDER — HYDROCODONE-ACETAMINOPHEN 5-325 MG PO TABS: 1.0000 | ORAL_TABLET | ORAL | 0 refills | Status: AC | PRN

## 2018-11-13 MED ORDER — MIDAZOLAM HCL 2 MG/2ML IJ SOLN
INTRAMUSCULAR | Status: DC | PRN
  Administered 2018-11-13: 2 mg via INTRAVENOUS

## 2018-11-13 MED ORDER — PROPOFOL 10 MG/ML IV BOLUS
INTRAVENOUS | Status: AC
  Filled 2018-11-13: qty 20

## 2018-11-13 MED ORDER — CIPROFLOXACIN IN D5W 400 MG/200ML IV SOLN
INTRAVENOUS | Status: AC
  Filled 2018-11-13: qty 200

## 2018-11-13 MED ORDER — SEVOFLURANE IN SOLN
RESPIRATORY_TRACT | Status: AC
  Filled 2018-11-13: qty 250

## 2018-11-13 MED ORDER — BELLADONNA ALKALOIDS-OPIUM 16.2-60 MG RE SUPP
RECTAL | Status: AC
  Filled 2018-11-13: qty 1

## 2018-11-13 MED ORDER — TAMSULOSIN HCL 0.4 MG PO CAPS: 0.4000 mg | ORAL_CAPSULE | Freq: Every day | ORAL | 0 refills | Status: DC

## 2018-11-13 MED ORDER — OXYBUTYNIN CHLORIDE ER 10 MG PO TB24: 10.0000 mg | ORAL_TABLET | Freq: Every day | ORAL | 0 refills | Status: AC | PRN

## 2018-11-13 MED ORDER — HEPARIN SODIUM (PORCINE) 5000 UNIT/ML IJ SOLN
INTRAMUSCULAR | Status: AC
  Filled 2018-11-13: qty 1

## 2018-11-13 MED ORDER — SUGAMMADEX SODIUM 200 MG/2ML IV SOLN
INTRAVENOUS | Status: DC | PRN
  Administered 2018-11-13: 154.2 mg via INTRAVENOUS

## 2018-11-13 MED ORDER — DEXAMETHASONE SODIUM PHOSPHATE 10 MG/ML IJ SOLN
INTRAMUSCULAR | Status: AC
  Filled 2018-11-13: qty 1

## 2018-11-13 MED ORDER — ROCURONIUM BROMIDE 50 MG/5ML IV SOLN
INTRAVENOUS | Status: AC
  Filled 2018-11-13: qty 1

## 2018-11-13 MED ORDER — FAMOTIDINE 20 MG PO TABS
20.0000 mg | ORAL_TABLET | Freq: Once | ORAL | Status: AC
  Administered 2018-11-13: 20 mg via ORAL

## 2018-11-13 MED ORDER — CIPROFLOXACIN HCL 500 MG PO TABS: 500.0000 mg | ORAL_TABLET | Freq: Every day | ORAL | 0 refills | Status: AC

## 2018-11-13 MED ORDER — FENTANYL CITRATE (PF) 100 MCG/2ML IJ SOLN: 25.0000 ug | INTRAMUSCULAR | Status: DC | PRN

## 2018-11-13 MED ORDER — MIDAZOLAM HCL 2 MG/2ML IJ SOLN
INTRAMUSCULAR | Status: AC
  Filled 2018-11-13: qty 2

## 2018-11-13 MED ORDER — DEXAMETHASONE SODIUM PHOSPHATE 10 MG/ML IJ SOLN
INTRAMUSCULAR | Status: DC | PRN
  Administered 2018-11-13: 8 mg via INTRAVENOUS

## 2018-11-13 MED ORDER — SUGAMMADEX SODIUM 200 MG/2ML IV SOLN
INTRAVENOUS | Status: AC
  Filled 2018-11-13: qty 2

## 2018-11-13 MED ORDER — MEPERIDINE HCL 50 MG/ML IJ SOLN: 6.2500 mg | INTRAMUSCULAR | Status: DC | PRN

## 2018-11-13 MED ORDER — PROMETHAZINE HCL 25 MG/ML IJ SOLN: 6.2500 mg | INTRAMUSCULAR | Status: DC | PRN

## 2018-11-13 MED ORDER — LIDOCAINE HCL (CARDIAC) PF 100 MG/5ML IV SOSY
PREFILLED_SYRINGE | INTRAVENOUS | Status: DC | PRN
  Administered 2018-11-13: 40 mg via INTRAVENOUS

## 2018-11-13 MED ORDER — FAMOTIDINE 20 MG PO TABS
ORAL_TABLET | ORAL | Status: AC
  Filled 2018-11-13: qty 1

## 2018-11-13 SURGICAL SUPPLY — 32 items
BAG DRAIN CYSTO-URO LG1000N (MISCELLANEOUS) ×3 IMPLANT
BAG URINE DRAINAGE (UROLOGICAL SUPPLIES) ×2 IMPLANT
BRUSH SCRUB EZ 1% IODOPHOR (MISCELLANEOUS) ×3 IMPLANT
BULB IRRIG PATHFIND (MISCELLANEOUS) IMPLANT
CATH URETL 5X70 OPEN END (CATHETERS) IMPLANT
CNTNR SPEC 2.5X3XGRAD LEK (MISCELLANEOUS)
CONT SPEC 4OZ STER OR WHT (MISCELLANEOUS)
CONTAINER SPEC 2.5X3XGRAD LEK (MISCELLANEOUS) IMPLANT
DRAPE UTILITY 15X26 TOWEL STRL (DRAPES) ×3 IMPLANT
FIBER LASER LITHO 273 (Laser) ×2 IMPLANT
GLOVE BIOGEL PI IND STRL 7.5 (GLOVE) ×1 IMPLANT
GLOVE BIOGEL PI INDICATOR 7.5 (GLOVE) ×2
GOWN STRL REUS W/ TWL LRG LVL3 (GOWN DISPOSABLE) ×1 IMPLANT
GOWN STRL REUS W/ TWL XL LVL3 (GOWN DISPOSABLE) ×1 IMPLANT
GOWN STRL REUS W/TWL LRG LVL3 (GOWN DISPOSABLE) ×2
GOWN STRL REUS W/TWL XL LVL3 (GOWN DISPOSABLE) ×2
GUIDEWIRE STR DUAL SENSOR (WIRE) ×3 IMPLANT
INFUSOR MANOMETER BAG 3000ML (MISCELLANEOUS) ×3 IMPLANT
INTRODUCER DILATOR DOUBLE (INTRODUCER) IMPLANT
KIT TURNOVER CYSTO (KITS) ×3 IMPLANT
PACK CYSTO AR (MISCELLANEOUS) ×3 IMPLANT
SET CYSTO W/LG BORE CLAMP LF (SET/KITS/TRAYS/PACK) ×3 IMPLANT
SHEATH URETERAL 12FRX35CM (MISCELLANEOUS) IMPLANT
SOL .9 NS 3000ML IRR  AL (IV SOLUTION) ×2
SOL .9 NS 3000ML IRR UROMATIC (IV SOLUTION) ×1 IMPLANT
STENT URET 6FRX24 CONTOUR (STENTS) ×2 IMPLANT
STENT URET 6FRX26 CONTOUR (STENTS) IMPLANT
SURGILUBE 2OZ TUBE FLIPTOP (MISCELLANEOUS) ×3 IMPLANT
SYR 10ML LL (SYRINGE) ×3 IMPLANT
TUBING ART PRESS 48 MALE/FEM (TUBING) IMPLANT
VALVE UROSEAL ADJ ENDO (VALVE) ×2 IMPLANT
WATER STERILE IRR 1000ML POUR (IV SOLUTION) ×3 IMPLANT
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
## 2018-11-13 NOTE — Op Note (Signed)
Date of procedure: 11/13/18  Preoperative diagnosis:  1. Left 8 mm UPJ stone  Postoperative diagnosis:  1. Same  Procedure: 1. Cystoscopy, left ureteroscopy, laser lithotripsy, left retrograde pyelogram with intraoperative interpretation, left ureteral stent placement  Surgeon: Nickolas Madrid, MD  Anesthesia: General  Complications: None  Intraoperative findings:  1.  Normal cystoscopy 2.  Uncomplicated dusting of all left-sided stones 3.  Uncomplicated left ureteral stent placement  EBL: None  Specimens: None  Drains: Left 6 French by 24 cm ureteral stent, 16 French Foley  Indication: Julie Irwin is a 63 y.o. patient with metastatic breast cancer and multiple week history of left-sided flank pain as well as recurrent UTIs.  CT scan showed a 8 mm stone at the left UPJ with some hydronephrosis.  Urinalysis showed no signs of infection, and she elected to proceed with definitive management with ureteroscopy.  After reviewing the management options for treatment, they elected to proceed with the above surgical procedure(s). We have discussed the potential benefits and risks of the procedure, side effects of the proposed treatment, the likelihood of the patient achieving the goals of the procedure, and any potential problems that might occur during the procedure or recuperation. Informed consent has been obtained.  Description of procedure:  The patient was taken to the operating room and general anesthesia was induced. SCDs were placed and 5000 units subcutaneous heparin were given for DVT prophylaxis. The patient was placed in the dorsal lithotomy position, prepped and draped in the usual sterile fashion, and preoperative antibiotics(Cipro) were administered. A preoperative time-out was performed.   A 21 French rigid cystoscope with a 30 degree lens was used to intubate the urethra.  Thorough cystoscopy was performed and was grossly normal.  The ureteral orifices were orthotopic  bilaterally.  A sensor wire was advanced into the left ureteral orifice and passed easily up into the renal pelvis.  The stone was clearly seen on plain film.  An 70/10 French ureteral dilator was used to add a second safety wire.  The digital flexible single-channel ureteroscope was advanced over 1 of the sensor wires and advanced easily up into the kidney.  Thorough pyeloscopy was performed and showed an 8 mm stone that had been pushed into the renal pelvis, as well as some smaller stones in the lower pole.  Vision was excellent and there was no purulence or cloudy urine.  A 273 m laser fiber on settings of 0.3 J and 30 Hz was used to dust the 8 mm stone in the renal pelvis to <1 mm fragments.  Attention was then turned to the lower pole stone burden and these were also methodically dusted.  Thorough pyeloscopy was performed and showed no residual stone fragments.  Contrast was injected through the scope and demonstrated no extravasation or filling defects.  Careful pullback ureteroscopy demonstrated no residual ureteral fragments, and the ureter was widely patent with no sign of injury.  A 6 French by 24 cm stent was fluoroscopically placed over the wire with an excellent curl in the renal pelvis as well as in the bladder.  A 54 French Foley was placed for temporary drainage postop in the setting of her prior recurrent infections.  Disposition: Stable to PACU  Plan: Remove Foley prior to discharge today Cipro prophylaxis while stent in place Stent removal in clinic in 5 to 7 days  Nickolas Madrid, MD

## 2018-11-13 NOTE — Discharge Instructions (Signed)

## 2018-11-13 NOTE — Anesthesia Post-op Follow-up Note (Signed)
Anesthesia QCDR form completed.        

## 2018-11-13 NOTE — Anesthesia Postprocedure Evaluation (Signed)
Anesthesia Post Note  Patient: Julie Irwin  Procedure(s) Performed: CYSTOSCOPY/URETEROSCOPY/HOLMIUM LASER/STENT PLACEMENT (Left )  Patient location during evaluation: PACU Anesthesia Type: General Level of consciousness: awake and alert and oriented Pain management: pain level controlled Vital Signs Assessment: post-procedure vital signs reviewed and stable Respiratory status: spontaneous breathing, nonlabored ventilation and respiratory function stable Cardiovascular status: blood pressure returned to baseline and stable Postop Assessment: no signs of nausea or vomiting Anesthetic complications: no     Last Vitals:  Vitals:   11/13/18 0911 11/13/18 0926  BP: (!) 113/50 110/70  Pulse: 74 70  Resp: 16 16  Temp:    SpO2: 100% 100%    Last Pain:  Vitals:   11/13/18 0926  TempSrc:   PainSc: 0-No pain                 Deisha Stull

## 2018-11-13 NOTE — Transfer of Care (Signed)
Immediate Anesthesia Transfer of Care Note  Patient: Julie Irwin  Procedure(s) Performed: CYSTOSCOPY/URETEROSCOPY/HOLMIUM LASER/STENT PLACEMENT (Left )  Patient Location: PACU  Anesthesia Type:General  Level of Consciousness: sedated  Airway & Oxygen Therapy: Patient Spontanous Breathing and Patient connected to face mask oxygen  Post-op Assessment: Report given to RN and Post -op Vital signs reviewed and stable  Post vital signs: Reviewed and stable  Last Vitals:  Vitals Value Taken Time  BP 117/69 11/13/2018  8:56 AM  Temp 36.2 C 11/13/2018  8:56 AM  Pulse 70 11/13/2018  9:00 AM  Resp 12 11/13/2018  9:00 AM  SpO2 100 % 11/13/2018  9:00 AM  Vitals shown include unvalidated device data.  Last Pain:  Vitals:   11/13/18 0856  TempSrc:   PainSc: Asleep         Complications: No apparent anesthesia complications

## 2018-11-13 NOTE — H&P (Signed)
UROLOGY H&P UPDATE  Agree with prior H&P dated 11/12/2018. 63 yo F with metastatic breast cancer, recurrent UTIs, and left flank pain, with 45mm left UPJ stone.   Cardiac: RRR Lungs: CTA bilaterally  Laterality: LEFT Procedure: LEFT URS/LL/Stent  Urine: urinalysis 4/1: few bacteria, nitrite negative, 0-5 WBCs, 3-10 RBCs  Started on Cipro 500mg  BID last night in setting of prior recent recurrent UTIs  Informed consent obtained, we specifically discussed the risks of bleeding, infection, post-operative pain, need for additional procedures, stent related symptoms, and risks of recurrence.  Billey Co, MD 11/13/2018

## 2018-11-15 LAB — CULTURE, URINE COMPREHENSIVE

## 2018-11-20 ENCOUNTER — Other Ambulatory Visit: Payer: Self-pay

## 2018-11-20 ENCOUNTER — Ambulatory Visit (INDEPENDENT_AMBULATORY_CARE_PROVIDER_SITE_OTHER): Payer: 59 | Admitting: Urology

## 2018-11-20 ENCOUNTER — Encounter: Payer: Self-pay | Admitting: Urology

## 2018-11-20 VITALS — BP 116/77 | HR 78 | Ht 63.0 in | Wt 170.0 lb

## 2018-11-20 DIAGNOSIS — N390 Urinary tract infection, site not specified: Secondary | ICD-10-CM

## 2018-11-20 MED ORDER — CIPROFLOXACIN HCL 500 MG PO TABS: 500.0000 mg | ORAL_TABLET | Freq: Two times a day (BID) | ORAL | 0 refills | Status: DC

## 2018-11-20 MED ORDER — CIPROFLOXACIN HCL 500 MG PO TABS
500.0000 mg | ORAL_TABLET | Freq: Once | ORAL | Status: AC
  Administered 2018-11-20: 500 mg via ORAL

## 2018-11-20 NOTE — Addendum Note (Signed)
Addended by: Verlene Mayer A on: 11/20/2018 01:59 PM   Modules accepted: Orders

## 2018-11-20 NOTE — Patient Instructions (Signed)

## 2018-11-20 NOTE — Progress Notes (Signed)
Cystoscopy Procedure Note:  Indication: Stent removal s/p LEFT URS/LL/stent for 63mm UPJ stone  After informed consent and discussion of the procedure and its risks, Julie Irwin was positioned and prepped in the standard fashion. Cystoscopy was performed with a flexible cystoscope. The stent was grasped with flexible graspers and removed in its entirety. The patient tolerated the procedure well.  Findings: Uncomplicated stent removal  Assessment and Plan: 3 days treatment cipro in setting of prior recurrent UTIs, and low growth Pseudomonas on recent culture RBUS in 4-6 weeks to eval for silent hydro, virtual visit to discuss results  Billey Co, MD 11/20/2018

## 2018-11-24 ENCOUNTER — Telehealth: Payer: Self-pay

## 2018-11-24 NOTE — Telephone Encounter (Signed)
Patient called stating that she was at [facility] having her blood drawn. However, the order that she had on hand, they are no longer accepting it. She asked if there was any way that I could fax her a new order to [phone]. Order was faxed.

## 2018-11-25 ENCOUNTER — Encounter: Payer: Self-pay | Admitting: Oncology

## 2018-11-27 ENCOUNTER — Other Ambulatory Visit: Payer: Self-pay

## 2018-11-27 ENCOUNTER — Encounter: Payer: Self-pay | Admitting: Oncology

## 2018-11-27 ENCOUNTER — Inpatient Hospital Stay: Payer: No Typology Code available for payment source | Attending: Oncology | Admitting: Oncology

## 2018-11-27 ENCOUNTER — Inpatient Hospital Stay: Payer: No Typology Code available for payment source

## 2018-11-27 VITALS — BP 100/68 | HR 73 | Temp 98.1°F | Ht 63.0 in | Wt 170.0 lb

## 2018-11-27 DIAGNOSIS — Z5112 Encounter for antineoplastic immunotherapy: Secondary | ICD-10-CM | POA: Diagnosis not present

## 2018-11-27 DIAGNOSIS — C50411 Malignant neoplasm of upper-outer quadrant of right female breast: Secondary | ICD-10-CM

## 2018-11-27 MED ORDER — HEPARIN SOD (PORK) LOCK FLUSH 100 UNIT/ML IV SOLN
500.0000 [IU] | Freq: Once | INTRAVENOUS | Status: AC | PRN
  Administered 2018-11-27: 11:00:00 500 [IU]
  Filled 2018-11-27: qty 5

## 2018-11-27 MED ORDER — DIPHENHYDRAMINE HCL 25 MG PO CAPS
25.0000 mg | ORAL_CAPSULE | Freq: Once | ORAL | Status: AC
  Administered 2018-11-27: 10:00:00 25 mg via ORAL
  Filled 2018-11-27: qty 1

## 2018-11-27 MED ORDER — ACETAMINOPHEN 325 MG PO TABS
650.0000 mg | ORAL_TABLET | Freq: Once | ORAL | Status: AC
  Administered 2018-11-27: 650 mg via ORAL
  Filled 2018-11-27: qty 2

## 2018-11-27 MED ORDER — SODIUM CHLORIDE 0.9 % IV SOLN
Freq: Once | INTRAVENOUS | Status: AC
  Administered 2018-11-27: 10:00:00 via INTRAVENOUS
  Filled 2018-11-27: qty 250

## 2018-11-27 MED ORDER — SODIUM CHLORIDE 0.9 % IV SOLN
3.3000 mg/kg | Freq: Once | INTRAVENOUS | Status: AC
  Administered 2018-11-27: 11:00:00 260 mg via INTRAVENOUS
  Filled 2018-11-27: qty 5
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
## 2018-11-27 NOTE — Progress Notes (Signed)
Patient stated that he had been doing well with no complaints. Patient stated that she CYSTOSCOPY/URETEROSCOPY on 11/13/2018 and doing well.

## 2018-12-01 ENCOUNTER — Other Ambulatory Visit: Payer: Self-pay

## 2018-12-01 DIAGNOSIS — N200 Calculus of kidney: Secondary | ICD-10-CM

## 2018-12-01 NOTE — Telephone Encounter (Signed)
Please approve or deny RX. Thanks

## 2018-12-04 ENCOUNTER — Other Ambulatory Visit: Payer: Self-pay | Admitting: *Deleted

## 2018-12-04 MED ORDER — TAMSULOSIN HCL 0.4 MG PO CAPS: 0.4000 mg | ORAL_CAPSULE | Freq: Every day | ORAL | 0 refills | Status: DC

## 2018-12-15 ENCOUNTER — Ambulatory Visit: Payer: Self-pay | Admitting: Urology

## 2018-12-15 ENCOUNTER — Other Ambulatory Visit: Payer: Self-pay

## 2018-12-16 ENCOUNTER — Encounter: Payer: Self-pay | Admitting: Oncology

## 2018-12-18 ENCOUNTER — Inpatient Hospital Stay: Payer: No Typology Code available for payment source | Attending: Oncology | Admitting: Oncology

## 2018-12-18 ENCOUNTER — Encounter: Payer: Self-pay | Admitting: Oncology

## 2018-12-18 ENCOUNTER — Inpatient Hospital Stay: Payer: No Typology Code available for payment source

## 2018-12-18 ENCOUNTER — Other Ambulatory Visit: Payer: Self-pay

## 2018-12-18 VITALS — Temp 97.2°F | Resp 18 | Wt 170.0 lb

## 2018-12-18 VITALS — BP 102/69 | HR 57 | Temp 97.5°F | Resp 18

## 2018-12-18 DIAGNOSIS — C50411 Malignant neoplasm of upper-outer quadrant of right female breast: Secondary | ICD-10-CM | POA: Insufficient documentation

## 2018-12-18 DIAGNOSIS — Z5112 Encounter for antineoplastic immunotherapy: Secondary | ICD-10-CM | POA: Diagnosis present

## 2018-12-18 DIAGNOSIS — C50919 Malignant neoplasm of unspecified site of unspecified female breast: Secondary | ICD-10-CM

## 2018-12-18 MED ORDER — DIPHENHYDRAMINE HCL 25 MG PO CAPS
25.0000 mg | ORAL_CAPSULE | Freq: Once | ORAL | Status: AC
  Administered 2018-12-18: 25 mg via ORAL
  Filled 2018-12-18: qty 1

## 2018-12-18 MED ORDER — ACETAMINOPHEN 325 MG PO TABS
650.0000 mg | ORAL_TABLET | Freq: Once | ORAL | Status: AC
  Administered 2018-12-18: 650 mg via ORAL
  Filled 2018-12-18: qty 2

## 2018-12-18 MED ORDER — SODIUM CHLORIDE 0.9 % IV SOLN
Freq: Once | INTRAVENOUS | Status: AC
  Administered 2018-12-18: 10:00:00 via INTRAVENOUS
  Filled 2018-12-18: qty 250

## 2018-12-18 MED ORDER — SODIUM CHLORIDE 0.9 % IV SOLN
3.3000 mg/kg | Freq: Once | INTRAVENOUS | Status: AC
  Administered 2018-12-18: 260 mg via INTRAVENOUS
  Filled 2018-12-18: qty 5

## 2018-12-18 MED ORDER — HEPARIN SOD (PORK) LOCK FLUSH 100 UNIT/ML IV SOLN
500.0000 [IU] | Freq: Once | INTRAVENOUS | Status: AC | PRN
  Administered 2018-12-18: 500 [IU]
  Filled 2018-12-18: qty 5
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
## 2018-12-18 NOTE — Progress Notes (Signed)
Patient reports she has been having headaches and some issues with balance.

## 2018-12-22 ENCOUNTER — Other Ambulatory Visit: Payer: Self-pay

## 2018-12-22 ENCOUNTER — Ambulatory Visit
Admission: RE | Admit: 2018-12-22 | Discharge: 2018-12-22 | Disposition: A | Discharge status: Home / Self Care | Payer: 59 | Source: Ambulatory Visit | Attending: Urology | Admitting: Urology

## 2018-12-22 DIAGNOSIS — N390 Urinary tract infection, site not specified: Secondary | ICD-10-CM | POA: Diagnosis not present

## 2018-12-25 ENCOUNTER — Other Ambulatory Visit: Payer: Self-pay | Admitting: *Deleted

## 2018-12-25 MED ORDER — TAMSULOSIN HCL 0.4 MG PO CAPS: 0.4000 mg | ORAL_CAPSULE | Freq: Every day | ORAL | 0 refills | Status: DC

## 2018-12-31 ENCOUNTER — Other Ambulatory Visit: Payer: Self-pay

## 2018-12-31 ENCOUNTER — Telehealth (INDEPENDENT_AMBULATORY_CARE_PROVIDER_SITE_OTHER): Payer: 59 | Admitting: Urology

## 2018-12-31 DIAGNOSIS — N200 Calculus of kidney: Secondary | ICD-10-CM

## 2018-12-31 NOTE — Progress Notes (Signed)
Virtual Visit via Telephone Note  I connected with Julie Irwin on 12/31/18 at  3:45 PM EDT by telephone and verified that I am speaking with the correct person using two identifiers.   I discussed the limitations, risks, security and privacy concerns of performing an evaluation and management service by telephone and the availability of in person appointments. We discussed the impact of the COVID-19 on the healthcare system, and the importance of social distancing and reducing patient and provider exposure. I also discussed with the patient that there may be a patient responsible charge related to this service. The patient expressed understanding and agreed to proceed.  Reason for visit: Follow-up after left ureteroscopy, laser lithotripsy  History of Present Illness: Julie Irwin is a very nice 63 year old lady with metastatic breast cancer who underwent left ureteroscopy and laser lithotripsy on 11/13/2018 for an obstructing 8 mm UPJ stone.  She was also having recurrent UTIs prior to surgery and intermittent left-sided flank pain.  Her postop course was uneventful, and her stent was removed on 11/20/2018.  Follow-up renal ultrasound on 12/22/2018 shows no hydronephrosis.  She reports she is doing very well and denies any recurrent urinary infections or flank pain at this time.  She denies any fevers or chills.  We discussed general stone prevention strategies including adequate hydration with goal of producing 2.5 L of urine daily, increasing citric acid intake, increasing calcium intake during high oxalate meals, minimizing animal protein, and decreasing salt intake.0  Follow Up: RTC 1 year with [facility]   I discussed the assessment and treatment plan with the patient. The patient was provided an opportunity to ask questions and all were answered. The patient agreed with the plan and demonstrated an understanding of the instructions.   The patient was advised to call back or seek an in-person  evaluation if the symptoms worsen or if the condition fails to improve as anticipated.  I provided 11 minutes of non-face-to-face time during this encounter.   Billey Co, MD

## 2019-01-01 ENCOUNTER — Other Ambulatory Visit: Payer: Self-pay

## 2019-01-01 ENCOUNTER — Ambulatory Visit
Admission: RE | Admit: 2019-01-01 | Discharge: 2019-01-01 | Disposition: A | Discharge status: Home / Self Care | Payer: 59 | Source: Ambulatory Visit | Attending: Oncology | Admitting: Oncology

## 2019-01-01 DIAGNOSIS — E785 Hyperlipidemia, unspecified: Secondary | ICD-10-CM | POA: Diagnosis not present

## 2019-01-01 DIAGNOSIS — C50919 Malignant neoplasm of unspecified site of unspecified female breast: Secondary | ICD-10-CM | POA: Diagnosis present

## 2019-01-01 NOTE — Progress Notes (Signed)
*  PRELIMINARY RESULTS* Echocardiogram 2D Echocardiogram has been performed.  Sherrie Sport 01/01/2019, 10:51 AM
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
## 2019-01-08 ENCOUNTER — Other Ambulatory Visit: Payer: No Typology Code available for payment source

## 2019-01-08 ENCOUNTER — Inpatient Hospital Stay: Payer: No Typology Code available for payment source

## 2019-01-08 ENCOUNTER — Inpatient Hospital Stay: Payer: No Typology Code available for payment source | Admitting: Oncology
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
## 2019-01-21 ENCOUNTER — Other Ambulatory Visit: Payer: Self-pay

## 2019-01-22 ENCOUNTER — Inpatient Hospital Stay: Payer: No Typology Code available for payment source | Attending: Oncology | Admitting: Oncology

## 2019-01-22 ENCOUNTER — Encounter: Payer: Self-pay | Admitting: Oncology

## 2019-01-22 ENCOUNTER — Other Ambulatory Visit: Payer: Self-pay

## 2019-01-22 ENCOUNTER — Inpatient Hospital Stay: Payer: No Typology Code available for payment source

## 2019-01-22 VITALS — BP 108/73 | HR 64 | Temp 97.8°F | Resp 19

## 2019-01-22 VITALS — BP 94/68 | HR 71 | Temp 97.8°F | Resp 18 | Wt 174.6 lb

## 2019-01-22 DIAGNOSIS — C50411 Malignant neoplasm of upper-outer quadrant of right female breast: Secondary | ICD-10-CM

## 2019-01-22 DIAGNOSIS — F418 Other specified anxiety disorders: Secondary | ICD-10-CM

## 2019-01-22 DIAGNOSIS — Z5112 Encounter for antineoplastic immunotherapy: Secondary | ICD-10-CM | POA: Diagnosis not present

## 2019-01-22 MED ORDER — HEPARIN SOD (PORK) LOCK FLUSH 100 UNIT/ML IV SOLN
500.0000 [IU] | Freq: Once | INTRAVENOUS | Status: AC | PRN
  Administered 2019-01-22: 500 [IU]
  Filled 2019-01-22: qty 5

## 2019-01-22 MED ORDER — SODIUM CHLORIDE 0.9 % IV SOLN
Freq: Once | INTRAVENOUS | Status: AC
  Administered 2019-01-22: 11:00:00 via INTRAVENOUS
  Filled 2019-01-22: qty 250

## 2019-01-22 MED ORDER — DIPHENHYDRAMINE HCL 25 MG PO CAPS
25.0000 mg | ORAL_CAPSULE | Freq: Once | ORAL | Status: AC
  Administered 2019-01-22: 25 mg via ORAL
  Filled 2019-01-22: qty 1

## 2019-01-22 MED ORDER — ACETAMINOPHEN 325 MG PO TABS
650.0000 mg | ORAL_TABLET | Freq: Once | ORAL | Status: AC
  Administered 2019-01-22: 11:00:00 650 mg via ORAL
  Filled 2019-01-22: qty 2

## 2019-01-22 MED ORDER — SODIUM CHLORIDE 0.9 % IV SOLN
3.3000 mg/kg | Freq: Once | INTRAVENOUS | Status: AC
  Administered 2019-01-22: 260 mg via INTRAVENOUS
  Filled 2019-01-22: qty 5

## 2019-01-22 NOTE — Progress Notes (Signed)
Pt in for follow up denies any difficulties or concerns today.
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
## 2019-02-10 ENCOUNTER — Encounter: Payer: Self-pay | Admitting: Oncology

## 2019-02-12 ENCOUNTER — Inpatient Hospital Stay: Payer: No Typology Code available for payment source

## 2019-02-12 ENCOUNTER — Other Ambulatory Visit: Payer: Self-pay

## 2019-02-12 ENCOUNTER — Encounter: Payer: Self-pay | Admitting: Oncology

## 2019-02-12 ENCOUNTER — Inpatient Hospital Stay: Payer: No Typology Code available for payment source | Attending: Oncology | Admitting: Oncology

## 2019-02-12 VITALS — BP 100/68 | HR 64 | Temp 96.9°F | Ht 63.0 in | Wt 175.6 lb

## 2019-02-12 DIAGNOSIS — C50411 Malignant neoplasm of upper-outer quadrant of right female breast: Secondary | ICD-10-CM | POA: Diagnosis not present

## 2019-02-12 DIAGNOSIS — Z5112 Encounter for antineoplastic immunotherapy: Secondary | ICD-10-CM | POA: Diagnosis not present

## 2019-02-12 MED ORDER — SODIUM CHLORIDE 0.9% FLUSH
10.0000 mL | INTRAVENOUS | Status: DC | PRN
  Administered 2019-02-12: 10 mL via INTRAVENOUS
  Filled 2019-02-12: qty 10

## 2019-02-12 MED ORDER — HEPARIN SOD (PORK) LOCK FLUSH 100 UNIT/ML IV SOLN
INTRAVENOUS | Status: AC
  Filled 2019-02-12: qty 5

## 2019-02-12 MED ORDER — HEPARIN SOD (PORK) LOCK FLUSH 100 UNIT/ML IV SOLN
500.0000 [IU] | Freq: Once | INTRAVENOUS | Status: AC
  Administered 2019-02-12: 12:00:00 500 [IU] via INTRAVENOUS

## 2019-02-12 MED ORDER — SODIUM CHLORIDE 0.9 % IV SOLN
3.3000 mg/kg | Freq: Once | INTRAVENOUS | Status: AC
  Administered 2019-02-12: 260 mg via INTRAVENOUS
  Filled 2019-02-12: qty 5

## 2019-02-12 MED ORDER — SODIUM CHLORIDE 0.9 % IV SOLN
Freq: Once | INTRAVENOUS | Status: AC
  Administered 2019-02-12: 11:00:00 via INTRAVENOUS
  Filled 2019-02-12: qty 250

## 2019-02-12 MED ORDER — DIPHENHYDRAMINE HCL 25 MG PO CAPS
25.0000 mg | ORAL_CAPSULE | Freq: Once | ORAL | Status: AC
  Administered 2019-02-12: 11:00:00 25 mg via ORAL
  Filled 2019-02-12: qty 1

## 2019-02-12 MED ORDER — ACETAMINOPHEN 325 MG PO TABS
650.0000 mg | ORAL_TABLET | Freq: Once | ORAL | Status: AC
  Administered 2019-02-12: 11:00:00 650 mg via ORAL
  Filled 2019-02-12: qty 2

## 2019-02-12 NOTE — Progress Notes (Signed)
Patient stated that she had been doing well with no complaints. 

## 2019-02-12 NOTE — Progress Notes (Signed)
Labs scanned in from [place], md ok to proceed with treatment
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
## 2019-03-03 ENCOUNTER — Encounter: Payer: Self-pay | Admitting: Oncology

## 2019-03-05 ENCOUNTER — Inpatient Hospital Stay (HOSPITAL_BASED_OUTPATIENT_CLINIC_OR_DEPARTMENT_OTHER): Payer: No Typology Code available for payment source | Admitting: Oncology

## 2019-03-05 ENCOUNTER — Encounter: Payer: Self-pay | Admitting: Oncology

## 2019-03-05 ENCOUNTER — Other Ambulatory Visit: Payer: Self-pay

## 2019-03-05 ENCOUNTER — Inpatient Hospital Stay: Payer: No Typology Code available for payment source

## 2019-03-05 VITALS — BP 109/73 | HR 75 | Temp 98.5°F | Ht 63.0 in | Wt 178.0 lb

## 2019-03-05 DIAGNOSIS — C50411 Malignant neoplasm of upper-outer quadrant of right female breast: Secondary | ICD-10-CM

## 2019-03-05 DIAGNOSIS — Z5112 Encounter for antineoplastic immunotherapy: Secondary | ICD-10-CM | POA: Diagnosis not present

## 2019-03-05 DIAGNOSIS — C50919 Malignant neoplasm of unspecified site of unspecified female breast: Secondary | ICD-10-CM

## 2019-03-05 MED ORDER — SODIUM CHLORIDE 0.9 % IV SOLN
Freq: Once | INTRAVENOUS | Status: AC
  Administered 2019-03-05: 10:00:00 via INTRAVENOUS
  Filled 2019-03-05: qty 250

## 2019-03-05 MED ORDER — DIPHENHYDRAMINE HCL 25 MG PO CAPS
25.0000 mg | ORAL_CAPSULE | Freq: Once | ORAL | Status: AC
  Administered 2019-03-05: 25 mg via ORAL
  Filled 2019-03-05: qty 1

## 2019-03-05 MED ORDER — SODIUM CHLORIDE 0.9% FLUSH
10.0000 mL | INTRAVENOUS | Status: DC | PRN
  Administered 2019-03-05: 10:00:00 10 mL
  Filled 2019-03-05: qty 10

## 2019-03-05 MED ORDER — HEPARIN SOD (PORK) LOCK FLUSH 100 UNIT/ML IV SOLN
500.0000 [IU] | Freq: Once | INTRAVENOUS | Status: AC | PRN
  Administered 2019-03-05: 12:00:00 500 [IU]
  Filled 2019-03-05: qty 5

## 2019-03-05 MED ORDER — SODIUM CHLORIDE 0.9 % IV SOLN
3.3000 mg/kg | Freq: Once | INTRAVENOUS | Status: AC
  Administered 2019-03-05: 260 mg via INTRAVENOUS
  Filled 2019-03-05: qty 8

## 2019-03-05 MED ORDER — ACETAMINOPHEN 325 MG PO TABS
650.0000 mg | ORAL_TABLET | Freq: Once | ORAL | Status: AC
  Administered 2019-03-05: 10:00:00 650 mg via ORAL
  Filled 2019-03-05: qty 2

## 2019-03-05 NOTE — Progress Notes (Signed)
Patient stated that she had been doing well. 

## 2019-03-11 ENCOUNTER — Ambulatory Visit
Admission: RE | Admit: 2019-03-11 | Discharge: 2019-03-11 | Disposition: A | Discharge status: Home / Self Care | Payer: No Typology Code available for payment source | Source: Ambulatory Visit | Attending: Oncology | Admitting: Oncology

## 2019-03-11 ENCOUNTER — Other Ambulatory Visit: Payer: Self-pay

## 2019-03-11 DIAGNOSIS — C50919 Malignant neoplasm of unspecified site of unspecified female breast: Secondary | ICD-10-CM | POA: Insufficient documentation

## 2019-03-11 DIAGNOSIS — E785 Hyperlipidemia, unspecified: Secondary | ICD-10-CM | POA: Insufficient documentation

## 2019-03-11 MED ORDER — PERFLUTREN LIPID MICROSPHERE
1.0000 mL | INTRAVENOUS | Status: AC | PRN
  Administered 2019-03-11: 12:00:00 2 mL via INTRAVENOUS
  Filled 2019-03-11: qty 10

## 2019-03-11 NOTE — Progress Notes (Signed)
*  PRELIMINARY RESULTS* Echocardiogram 2D Echocardiogram has been performed.  Julie Irwin 03/11/2019, 12:10 PM

## 2019-03-17 ENCOUNTER — Other Ambulatory Visit: Payer: Self-pay | Admitting: Oncology
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
## 2019-03-23 ENCOUNTER — Other Ambulatory Visit: Payer: Self-pay

## 2019-03-23 ENCOUNTER — Ambulatory Visit
Admission: RE | Admit: 2019-03-23 | Discharge: 2019-03-23 | Disposition: A | Discharge status: Home / Self Care | Payer: No Typology Code available for payment source | Source: Ambulatory Visit | Attending: Oncology | Admitting: Oncology

## 2019-03-23 DIAGNOSIS — C50919 Malignant neoplasm of unspecified site of unspecified female breast: Secondary | ICD-10-CM | POA: Insufficient documentation

## 2019-03-23 LAB — POCT I-STAT CREATININE: Creatinine, Ser: 0.8 mg/dL (ref 0.44–1.00)

## 2019-03-23 MED ORDER — SODIUM CHLORIDE 0.9% FLUSH: 10.0000 mL | Freq: Two times a day (BID) | INTRAVENOUS | Status: DC

## 2019-03-23 MED ORDER — HEPARIN SOD (PORK) LOCK FLUSH 100 UNIT/ML IV SOLN
500.0000 [IU] | INTRAVENOUS | Status: AC | PRN
  Administered 2019-03-23: 12:00:00 500 [IU]

## 2019-03-23 MED ORDER — SODIUM CHLORIDE 0.9% FLUSH
10.0000 mL | INTRAVENOUS | Status: DC | PRN
  Administered 2019-03-23: 10 mL
  Filled 2019-03-23: qty 40

## 2019-03-23 MED ORDER — IOHEXOL 300 MG/ML  SOLN
100.0000 mL | Freq: Once | INTRAMUSCULAR | Status: AC | PRN
  Administered 2019-03-23: 100 mL via INTRAVENOUS

## 2019-03-25 ENCOUNTER — Encounter: Payer: Self-pay | Admitting: Oncology

## 2019-03-25 ENCOUNTER — Other Ambulatory Visit: Payer: Self-pay

## 2019-03-26 ENCOUNTER — Encounter: Payer: Self-pay | Admitting: Oncology

## 2019-03-26 ENCOUNTER — Other Ambulatory Visit: Payer: Self-pay

## 2019-03-26 ENCOUNTER — Inpatient Hospital Stay: Payer: No Typology Code available for payment source

## 2019-03-26 ENCOUNTER — Inpatient Hospital Stay: Payer: No Typology Code available for payment source | Attending: Oncology | Admitting: Oncology

## 2019-03-26 VITALS — BP 92/57 | HR 70 | Temp 97.7°F | Resp 18 | Wt 178.0 lb

## 2019-03-26 DIAGNOSIS — C50411 Malignant neoplasm of upper-outer quadrant of right female breast: Secondary | ICD-10-CM

## 2019-03-26 DIAGNOSIS — Z5112 Encounter for antineoplastic immunotherapy: Secondary | ICD-10-CM | POA: Diagnosis present

## 2019-03-26 MED ORDER — ACETAMINOPHEN 325 MG PO TABS
650.0000 mg | ORAL_TABLET | Freq: Once | ORAL | Status: AC
  Administered 2019-03-26: 650 mg via ORAL
  Filled 2019-03-26: qty 2

## 2019-03-26 MED ORDER — DIPHENHYDRAMINE HCL 25 MG PO CAPS
25.0000 mg | ORAL_CAPSULE | Freq: Once | ORAL | Status: AC
  Administered 2019-03-26: 25 mg via ORAL
  Filled 2019-03-26: qty 1

## 2019-03-26 MED ORDER — SODIUM CHLORIDE 0.9 % IV SOLN
Freq: Once | INTRAVENOUS | Status: AC
  Administered 2019-03-26: 10:00:00 via INTRAVENOUS
  Filled 2019-03-26: qty 250

## 2019-03-26 MED ORDER — SODIUM CHLORIDE 0.9% FLUSH
10.0000 mL | INTRAVENOUS | Status: DC | PRN
  Administered 2019-03-26: 10 mL
  Filled 2019-03-26: qty 10

## 2019-03-26 MED ORDER — SODIUM CHLORIDE 0.9 % IV SOLN
3.3000 mg/kg | Freq: Once | INTRAVENOUS | Status: AC
  Administered 2019-03-26: 260 mg via INTRAVENOUS
  Filled 2019-03-26: qty 5

## 2019-03-26 MED ORDER — HEPARIN SOD (PORK) LOCK FLUSH 100 UNIT/ML IV SOLN
500.0000 [IU] | Freq: Once | INTRAVENOUS | Status: AC | PRN
  Administered 2019-03-26: 500 [IU]
  Filled 2019-03-26: qty 5

## 2019-04-10 ENCOUNTER — Other Ambulatory Visit: Payer: Self-pay

## 2019-04-10 ENCOUNTER — Emergency Department: Payer: No Typology Code available for payment source

## 2019-04-10 ENCOUNTER — Emergency Department
Admission: EM | Admit: 2019-04-10 | Discharge: 2019-04-10 | Disposition: A | Discharge status: Other Acute Inpatient Hospital | Payer: No Typology Code available for payment source | Attending: Student in an Organized Health Care Education/Training Program | Admitting: Student in an Organized Health Care Education/Training Program

## 2019-04-10 ENCOUNTER — Encounter: Payer: Self-pay | Admitting: Emergency Medicine

## 2019-04-10 DIAGNOSIS — Z79899 Other long term (current) drug therapy: Secondary | ICD-10-CM | POA: Insufficient documentation

## 2019-04-10 DIAGNOSIS — Z20828 Contact with and (suspected) exposure to other viral communicable diseases: Secondary | ICD-10-CM | POA: Diagnosis not present

## 2019-04-10 DIAGNOSIS — E039 Hypothyroidism, unspecified: Secondary | ICD-10-CM | POA: Diagnosis not present

## 2019-04-10 DIAGNOSIS — J45909 Unspecified asthma, uncomplicated: Secondary | ICD-10-CM | POA: Diagnosis not present

## 2019-04-10 DIAGNOSIS — G936 Cerebral edema: Secondary | ICD-10-CM | POA: Insufficient documentation

## 2019-04-10 DIAGNOSIS — R410 Disorientation, unspecified: Secondary | ICD-10-CM | POA: Diagnosis present

## 2019-04-10 LAB — CBC
HCT: 43.3 % (ref 36.0–46.0)
Hemoglobin: 13.9 g/dL (ref 12.0–15.0)
MCH: 30.4 pg (ref 26.0–34.0)
MCHC: 32.1 g/dL (ref 30.0–36.0)
MCV: 94.7 fL (ref 80.0–100.0)
Platelets: 171 10*3/uL (ref 150–400)
RBC: 4.57 MIL/uL (ref 3.87–5.11)
RDW: 14.1 % (ref 11.5–15.5)
WBC: 4.9 10*3/uL (ref 4.0–10.5)
nRBC: 0 % (ref 0.0–0.2)

## 2019-04-10 LAB — COMPREHENSIVE METABOLIC PANEL
ALT: 35 U/L (ref 0–44)
AST: 53 U/L — ABNORMAL HIGH (ref 15–41)
Albumin: 3.6 g/dL (ref 3.5–5.0)
Alkaline Phosphatase: 105 U/L (ref 38–126)
Anion gap: 8 (ref 5–15)
BUN: 11 mg/dL (ref 8–23)
CO2: 23 mmol/L (ref 22–32)
Calcium: 9.2 mg/dL (ref 8.9–10.3)
Chloride: 108 mmol/L (ref 98–111)
Creatinine, Ser: 0.87 mg/dL (ref 0.44–1.00)
GFR calc Af Amer: >60 mL/min (ref >60)
GFR calc non Af Amer: >60 mL/min (ref >60)
Glucose, Bld: 95 mg/dL (ref 70–99)
Potassium: 3.6 mmol/L (ref 3.5–5.1)
Sodium: 139 mmol/L (ref 135–145)
Total Bilirubin: 1 mg/dL (ref 0.3–1.2)
Total Protein: 7.7 g/dL (ref 6.5–8.1)

## 2019-04-10 LAB — URINALYSIS, COMPLETE (UACMP) WITH MICROSCOPIC
Bacteria, UA: NONE SEEN
Bilirubin Urine: NEGATIVE
Glucose, UA: NEGATIVE mg/dL
Hgb urine dipstick: NEGATIVE
Ketones, ur: NEGATIVE mg/dL
Leukocytes,Ua: NEGATIVE
Nitrite: NEGATIVE
Protein, ur: NEGATIVE mg/dL
Specific Gravity, Urine: 1.005 (ref 1.005–1.030)
Squamous Epithelial / HPF: NONE SEEN (ref 0–5)
pH: 7 (ref 5.0–8.0)

## 2019-04-10 LAB — DIFFERENTIAL
Abs Immature Granulocytes: 0.02 10*3/uL (ref 0.00–0.07)
Basophils Absolute: 0 10*3/uL (ref 0.0–0.1)
Basophils Relative: 1 %
Eosinophils Absolute: 0.2 10*3/uL (ref 0.0–0.5)
Eosinophils Relative: 3 %
Immature Granulocytes: 0 %
Lymphocytes Relative: 26 %
Lymphs Abs: 1.3 10*3/uL (ref 0.7–4.0)
Monocytes Absolute: 0.5 10*3/uL (ref 0.1–1.0)
Monocytes Relative: 10 %
Neutro Abs: 3 10*3/uL (ref 1.7–7.7)
Neutrophils Relative %: 60 %

## 2019-04-10 LAB — PROTIME-INR
INR: 1 (ref 0.8–1.2)
Prothrombin Time: 12.7 seconds (ref 11.4–15.2)

## 2019-04-10 LAB — SARS CORONAVIRUS 2 BY RT PCR (HOSPITAL ORDER, PERFORMED IN ~~LOC~~ HOSPITAL LAB): SARS Coronavirus 2: NEGATIVE

## 2019-04-10 LAB — GLUCOSE, CAPILLARY: Glucose-Capillary: 73 mg/dL (ref 70–99)

## 2019-04-10 LAB — APTT: aPTT: 32 seconds (ref 24–36)

## 2019-04-10 MED ORDER — SODIUM CHLORIDE 0.9% FLUSH: 3.0000 mL | Freq: Once | INTRAVENOUS | Status: DC

## 2019-04-10 MED ORDER — ACETAMINOPHEN 500 MG PO TABS
1000.0000 mg | ORAL_TABLET | Freq: Once | ORAL | Status: AC
  Administered 2019-04-10: 14:00:00 1000 mg via ORAL
  Filled 2019-04-10: qty 2

## 2019-04-10 MED ORDER — DEXAMETHASONE SODIUM PHOSPHATE 10 MG/ML IJ SOLN
10.0000 mg | Freq: Once | INTRAMUSCULAR | Status: AC
  Administered 2019-04-10: 14:00:00 10 mg via INTRAVENOUS
  Filled 2019-04-10: qty 1

## 2019-04-10 MED ORDER — FENTANYL CITRATE (PF) 100 MCG/2ML IJ SOLN
25.0000 ug | INTRAMUSCULAR | Status: DC | PRN
  Administered 2019-04-10: 25 ug via INTRAVENOUS
  Filled 2019-04-10: qty 2

## 2019-04-10 NOTE — Consult Note (Signed)
Reviewed clinical history and imaging.   Based on lack of cranial coverage at [facility] and neurosurgical coverage for weekend, I recommended transfer to [facility].  Meade Maw MD

## 2019-04-10 NOTE — ED Triage Notes (Addendum)
Per son, patient fell Sunday and hit her head. Son said their was minimal confusion after fall, however today, when he first saw patient this morning at around 9 am, he noticed increased confusion and trouble getting words out. Abrasions noted above patient's right eye and on bridge of nose.

## 2019-04-10 NOTE — ED Notes (Signed)
Per Shanon Brow, transfer cooridinator, patient has been accepted at [facility] but awaiting bed placement.  Due to longer than normal time per their medical director they will accept patient from ER to ER.  [facility] EDP notified and agrees with this plan.  Secretary notified and transport being arranged.
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
## 2019-04-10 NOTE — ED Provider Notes (Signed)
-----------------------------------------   7:20 PM on 04/10/2019 -----------------------------------------  He was accepted to [facility] prior to Dr. Ruffin Frederick depression from the hospital.  We have been awaiting their bed assignment.  They have found that.  Intolerance with up by him prior to signout to me.  Patient awake and alert no acute distress at transfer.  Here is been otherwise unremarkable.   Schuyler Amor, MD 04/10/19 [phone]

## 2019-04-11 DIAGNOSIS — C7931 Secondary malignant neoplasm of brain: Secondary | ICD-10-CM | POA: Insufficient documentation
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
## 2019-04-16 ENCOUNTER — Inpatient Hospital Stay: Payer: 59

## 2019-04-16 ENCOUNTER — Inpatient Hospital Stay: Payer: 59 | Admitting: Oncology
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
## 2019-04-22 ENCOUNTER — Encounter: Payer: Self-pay | Admitting: Oncology

## 2019-04-22 ENCOUNTER — Other Ambulatory Visit: Payer: Self-pay

## 2019-04-22 NOTE — Progress Notes (Signed)
Pre screening completed no complaints today. 

## 2019-04-23 ENCOUNTER — Encounter: Payer: Self-pay | Admitting: *Deleted

## 2019-04-23 ENCOUNTER — Inpatient Hospital Stay: Payer: No Typology Code available for payment source

## 2019-04-23 ENCOUNTER — Inpatient Hospital Stay: Payer: No Typology Code available for payment source | Attending: Oncology | Admitting: Oncology

## 2019-04-23 ENCOUNTER — Other Ambulatory Visit: Payer: Self-pay

## 2019-04-23 VITALS — BP 100/57 | HR 69 | Temp 98.7°F | Wt 174.3 lb

## 2019-04-23 VITALS — BP 115/60 | HR 57 | Temp 97.5°F

## 2019-04-23 DIAGNOSIS — C50411 Malignant neoplasm of upper-outer quadrant of right female breast: Secondary | ICD-10-CM | POA: Diagnosis not present

## 2019-04-23 DIAGNOSIS — Z5112 Encounter for antineoplastic immunotherapy: Secondary | ICD-10-CM | POA: Diagnosis present

## 2019-04-23 DIAGNOSIS — D329 Benign neoplasm of meninges, unspecified: Secondary | ICD-10-CM | POA: Insufficient documentation

## 2019-04-23 MED ORDER — ACETAMINOPHEN 325 MG PO TABS
650.0000 mg | ORAL_TABLET | Freq: Once | ORAL | Status: AC
  Administered 2019-04-23: 11:00:00 650 mg via ORAL
  Filled 2019-04-23: qty 2

## 2019-04-23 MED ORDER — HEPARIN SOD (PORK) LOCK FLUSH 100 UNIT/ML IV SOLN
500.0000 [IU] | Freq: Once | INTRAVENOUS | Status: AC | PRN
  Administered 2019-04-23: 12:00:00 500 [IU]
  Filled 2019-04-23: qty 5

## 2019-04-23 MED ORDER — SODIUM CHLORIDE 0.9 % IV SOLN
Freq: Once | INTRAVENOUS | Status: AC
  Administered 2019-04-23: 10:00:00 via INTRAVENOUS
  Filled 2019-04-23: qty 250

## 2019-04-23 MED ORDER — SODIUM CHLORIDE 0.9 % IV SOLN
3.3000 mg/kg | Freq: Once | INTRAVENOUS | Status: AC
  Administered 2019-04-23: 11:00:00 260 mg via INTRAVENOUS
  Filled 2019-04-23: qty 5

## 2019-04-23 MED ORDER — DIPHENHYDRAMINE HCL 25 MG PO CAPS
25.0000 mg | ORAL_CAPSULE | Freq: Once | ORAL | Status: AC
  Administered 2019-04-23: 25 mg via ORAL
  Filled 2019-04-23: qty 1

## 2019-04-23 NOTE — Patient Instructions (Signed)
FMLA forms completed and faxed to [employer].

## 2019-04-23 NOTE — Progress Notes (Signed)
Patient here today for follow up.  Patient recently at [facility], recurrent brain mets and scheduled for radiation.

## 2019-04-23 NOTE — Progress Notes (Signed)
Pt denies to stay 30 minute post observation. Pt tolerated infusion well. Pt and VS stable at discharge.

## 2019-04-29 ENCOUNTER — Other Ambulatory Visit: Payer: Self-pay | Admitting: *Deleted

## 2019-04-29 MED ORDER — EXEMESTANE 25 MG PO TABS: 25.0000 mg | ORAL_TABLET | Freq: Every day | ORAL | 2 refills | Status: AC

## 2019-05-07 ENCOUNTER — Other Ambulatory Visit: Payer: Self-pay | Admitting: Oncology

## 2019-05-13 NOTE — Progress Notes (Signed)
Called patient for follow up visit .  Patient denies any, decrease in appetite, nausea, vomiting, diarrhea, constipation or pain.  Patient c/o having trouble sleeping.

## 2019-05-14 ENCOUNTER — Inpatient Hospital Stay: Payer: No Typology Code available for payment source

## 2019-05-14 ENCOUNTER — Encounter: Payer: Self-pay | Admitting: Oncology

## 2019-05-14 ENCOUNTER — Other Ambulatory Visit: Payer: Self-pay

## 2019-05-14 ENCOUNTER — Inpatient Hospital Stay: Payer: No Typology Code available for payment source | Attending: Oncology | Admitting: Oncology

## 2019-05-14 VITALS — BP 106/72 | HR 91 | Temp 99.6°F | Resp 18 | Wt 174.7 lb

## 2019-05-14 DIAGNOSIS — Z5112 Encounter for antineoplastic immunotherapy: Secondary | ICD-10-CM | POA: Insufficient documentation

## 2019-05-14 DIAGNOSIS — C50919 Malignant neoplasm of unspecified site of unspecified female breast: Secondary | ICD-10-CM | POA: Diagnosis not present

## 2019-05-14 DIAGNOSIS — C50411 Malignant neoplasm of upper-outer quadrant of right female breast: Secondary | ICD-10-CM

## 2019-05-14 MED ORDER — DIPHENHYDRAMINE HCL 25 MG PO CAPS
25.0000 mg | ORAL_CAPSULE | Freq: Once | ORAL | Status: AC
  Administered 2019-05-14: 25 mg via ORAL
  Filled 2019-05-14: qty 1

## 2019-05-14 MED ORDER — SODIUM CHLORIDE 0.9% FLUSH
10.0000 mL | INTRAVENOUS | Status: DC | PRN
  Administered 2019-05-14: 11:00:00 10 mL
  Filled 2019-05-14: qty 10

## 2019-05-14 MED ORDER — ACETAMINOPHEN 325 MG PO TABS
650.0000 mg | ORAL_TABLET | Freq: Once | ORAL | Status: AC
  Administered 2019-05-14: 650 mg via ORAL
  Filled 2019-05-14: qty 2

## 2019-05-14 MED ORDER — SODIUM CHLORIDE 0.9 % IV SOLN
Freq: Once | INTRAVENOUS | Status: AC
  Administered 2019-05-14: 11:00:00 via INTRAVENOUS
  Filled 2019-05-14: qty 250

## 2019-05-14 MED ORDER — SODIUM CHLORIDE 0.9 % IV SOLN
3.3000 mg/kg | Freq: Once | INTRAVENOUS | Status: AC
  Administered 2019-05-14: 260 mg via INTRAVENOUS
  Filled 2019-05-14: qty 8

## 2019-05-14 MED ORDER — HEPARIN SOD (PORK) LOCK FLUSH 100 UNIT/ML IV SOLN
500.0000 [IU] | Freq: Once | INTRAVENOUS | Status: AC | PRN
  Administered 2019-05-14: 500 [IU]
  Filled 2019-05-14: qty 5
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
## 2019-05-18 ENCOUNTER — Telehealth: Payer: Self-pay | Admitting: *Deleted

## 2019-05-18 NOTE — Telephone Encounter (Signed)
Call placed to patient to confirm type of leave needed at this time for FMLA/Disability. Patient requests continuous leave at this time due to diagnosis and current condition patient is unable to work. FMLA forms have been completed and scanned back to clinic for MD signature and to to be faxed back to [employer].
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
## 2019-06-03 ENCOUNTER — Encounter: Payer: Self-pay | Admitting: Oncology

## 2019-06-04 ENCOUNTER — Inpatient Hospital Stay: Payer: No Typology Code available for payment source

## 2019-06-04 ENCOUNTER — Inpatient Hospital Stay (HOSPITAL_BASED_OUTPATIENT_CLINIC_OR_DEPARTMENT_OTHER): Payer: No Typology Code available for payment source | Admitting: Oncology

## 2019-06-04 ENCOUNTER — Other Ambulatory Visit: Payer: Self-pay

## 2019-06-04 ENCOUNTER — Encounter: Payer: Self-pay | Admitting: Oncology

## 2019-06-04 VITALS — BP 109/71 | HR 86 | Temp 99.1°F | Wt 176.6 lb

## 2019-06-04 DIAGNOSIS — C50411 Malignant neoplasm of upper-outer quadrant of right female breast: Secondary | ICD-10-CM

## 2019-06-04 DIAGNOSIS — Z5112 Encounter for antineoplastic immunotherapy: Secondary | ICD-10-CM | POA: Diagnosis not present

## 2019-06-04 MED ORDER — SODIUM CHLORIDE 0.9% FLUSH
10.0000 mL | INTRAVENOUS | Status: DC | PRN
  Administered 2019-06-04: 10 mL
  Filled 2019-06-04: qty 10

## 2019-06-04 MED ORDER — SODIUM CHLORIDE 0.9 % IV SOLN
3.3000 mg/kg | Freq: Once | INTRAVENOUS | Status: AC
  Administered 2019-06-04: 260 mg via INTRAVENOUS
  Filled 2019-06-04: qty 8

## 2019-06-04 MED ORDER — ACETAMINOPHEN 325 MG PO TABS
ORAL_TABLET | ORAL | Status: AC
  Filled 2019-06-04: qty 2

## 2019-06-04 MED ORDER — ACETAMINOPHEN 325 MG PO TABS
650.0000 mg | ORAL_TABLET | Freq: Once | ORAL | Status: AC
  Administered 2019-06-04: 650 mg via ORAL

## 2019-06-04 MED ORDER — HEPARIN SOD (PORK) LOCK FLUSH 100 UNIT/ML IV SOLN
500.0000 [IU] | Freq: Once | INTRAVENOUS | Status: AC | PRN
  Administered 2019-06-04: 500 [IU]
  Filled 2019-06-04: qty 5

## 2019-06-04 MED ORDER — DIPHENHYDRAMINE HCL 25 MG PO CAPS
25.0000 mg | ORAL_CAPSULE | Freq: Once | ORAL | Status: AC
  Administered 2019-06-04: 10:00:00 25 mg via ORAL
  Filled 2019-06-04: qty 1

## 2019-06-04 MED ORDER — SODIUM CHLORIDE 0.9 % IV SOLN
Freq: Once | INTRAVENOUS | Status: AC
  Administered 2019-06-04: 10:00:00 via INTRAVENOUS
  Filled 2019-06-04: qty 250

## 2019-06-15 ENCOUNTER — Ambulatory Visit: Admission: RE | Admit: 2019-06-15 | Payer: No Typology Code available for payment source | Source: Ambulatory Visit

## 2019-06-18 ENCOUNTER — Other Ambulatory Visit: Payer: Self-pay

## 2019-06-18 ENCOUNTER — Ambulatory Visit
Admission: RE | Admit: 2019-06-18 | Discharge: 2019-06-18 | Disposition: A | Discharge status: Home / Self Care | Payer: No Typology Code available for payment source | Source: Ambulatory Visit | Attending: Oncology | Admitting: Oncology

## 2019-06-18 DIAGNOSIS — C50411 Malignant neoplasm of upper-outer quadrant of right female breast: Secondary | ICD-10-CM | POA: Diagnosis not present

## 2019-06-18 LAB — POCT I-STAT CREATININE: Creatinine, Ser: 1 mg/dL (ref 0.44–1.00)

## 2019-06-18 MED ORDER — IOHEXOL 300 MG/ML  SOLN
100.0000 mL | Freq: Once | INTRAMUSCULAR | Status: AC | PRN
  Administered 2019-06-18: 100 mL via INTRAVENOUS
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
## 2019-06-22 ENCOUNTER — Telehealth: Payer: Self-pay | Admitting: *Deleted

## 2019-06-22 NOTE — Telephone Encounter (Signed)
That's fine. She gets all her labs at [facility].  We used to have standing orders.

## 2019-06-22 NOTE — Telephone Encounter (Signed)
Son called asking for an order for her labs needed for appointment Thursday be sent to him without having to come to office to pick it up. Please call him back [phone]

## 2019-06-22 NOTE — Telephone Encounter (Signed)
Order written out and faxed to [facility] per Lovena Le patient son

## 2019-06-24 ENCOUNTER — Encounter: Payer: Self-pay | Admitting: Oncology

## 2019-06-25 ENCOUNTER — Other Ambulatory Visit: Payer: Self-pay

## 2019-06-25 ENCOUNTER — Telehealth: Payer: Self-pay | Admitting: Emergency Medicine

## 2019-06-25 ENCOUNTER — Inpatient Hospital Stay: Payer: No Typology Code available for payment source | Attending: Oncology | Admitting: Oncology

## 2019-06-25 ENCOUNTER — Encounter: Payer: Self-pay | Admitting: Oncology

## 2019-06-25 ENCOUNTER — Inpatient Hospital Stay: Payer: No Typology Code available for payment source

## 2019-06-25 VITALS — BP 105/69 | HR 71 | Temp 98.0°F | Wt 180.0 lb

## 2019-06-25 DIAGNOSIS — C50411 Malignant neoplasm of upper-outer quadrant of right female breast: Secondary | ICD-10-CM

## 2019-06-25 DIAGNOSIS — Z171 Estrogen receptor negative status [ER-]: Secondary | ICD-10-CM | POA: Diagnosis not present

## 2019-06-25 DIAGNOSIS — Z5111 Encounter for antineoplastic chemotherapy: Secondary | ICD-10-CM | POA: Diagnosis not present

## 2019-06-25 DIAGNOSIS — C50919 Malignant neoplasm of unspecified site of unspecified female breast: Secondary | ICD-10-CM

## 2019-06-25 MED ORDER — DIPHENHYDRAMINE HCL 25 MG PO CAPS
25.0000 mg | ORAL_CAPSULE | Freq: Once | ORAL | Status: AC
  Administered 2019-06-25: 25 mg via ORAL
  Filled 2019-06-25: qty 1

## 2019-06-25 MED ORDER — SODIUM CHLORIDE 0.9 % IV SOLN
Freq: Once | INTRAVENOUS | Status: AC
  Administered 2019-06-25: 11:00:00 via INTRAVENOUS
  Filled 2019-06-25: qty 250

## 2019-06-25 MED ORDER — ACETAMINOPHEN 325 MG PO TABS
650.0000 mg | ORAL_TABLET | Freq: Once | ORAL | Status: AC
  Administered 2019-06-25: 650 mg via ORAL
  Filled 2019-06-25: qty 2

## 2019-06-25 MED ORDER — SODIUM CHLORIDE 0.9 % IV SOLN
3.3000 mg/kg | Freq: Once | INTRAVENOUS | Status: AC
  Administered 2019-06-25: 260 mg via INTRAVENOUS
  Filled 2019-06-25: qty 5

## 2019-06-25 MED ORDER — HEPARIN SOD (PORK) LOCK FLUSH 100 UNIT/ML IV SOLN
500.0000 [IU] | Freq: Once | INTRAVENOUS | Status: AC | PRN
  Administered 2019-06-25: 500 [IU]
  Filled 2019-06-25: qty 5

## 2019-06-25 MED ORDER — DEXAMETHASONE 4 MG PO TABS: 4.0000 mg | ORAL_TABLET | Freq: Two times a day (BID) | ORAL | 0 refills | Status: DC

## 2019-06-25 NOTE — Progress Notes (Signed)
Pt here for follow up. Reports headache for the last several days, denies n/v. Reports multiple falls over last week.

## 2019-06-25 NOTE — Telephone Encounter (Signed)
Called son to relay change in medication as well as that pt has been scheduled for MRI for tomorrow. Pt's son reports that pt has not been taking decadron at all as another doctor had taken her off of it. Son also had question about why pt is not on a medication that one of her doctor's at [facility] had been recommending, and asked that Dr. Grayland Ormond call him. Message relayed to Dr. Grayland Ormond.

## 2019-06-26 ENCOUNTER — Ambulatory Visit
Admission: RE | Admit: 2019-06-26 | Discharge: 2019-06-26 | Disposition: A | Discharge status: Home / Self Care | Payer: 59 | Source: Ambulatory Visit | Attending: Oncology | Admitting: Oncology

## 2019-06-26 DIAGNOSIS — C50919 Malignant neoplasm of unspecified site of unspecified female breast: Secondary | ICD-10-CM | POA: Diagnosis not present

## 2019-06-26 MED ORDER — GADOBUTROL 1 MMOL/ML IV SOLN
8.0000 mL | Freq: Once | INTRAVENOUS | Status: AC | PRN
  Administered 2019-06-26: 12:00:00 8 mL via INTRAVENOUS

## 2019-06-30 ENCOUNTER — Other Ambulatory Visit: Payer: Self-pay | Admitting: Oncology

## 2019-07-01 IMAGING — CT CT ANGIO CHEST
2 of 6 series · 18 of 46 positions shown · IV contrast (isovue)
Comparison: Same day CXR

CLINICAL DATA: Tachycardia and dyspnea.

EXAM:
CT ANGIOGRAPHY CHEST WITH CONTRAST
TECHNIQUE: Multidetector CT imaging of the chest was performed using the
standard protocol during bolus administration of intravenous
contrast. Multiplanar CT image reconstructions and MIPs were
obtained to evaluate the vascular anatomy.
CONTRAST:  75 cc Isovue 370 IV

[Series 6: thins · axial · 0.69mm/px · z∈[-886,-622]mm · 16 of 290 slices shown]
[im 13/290  lung]
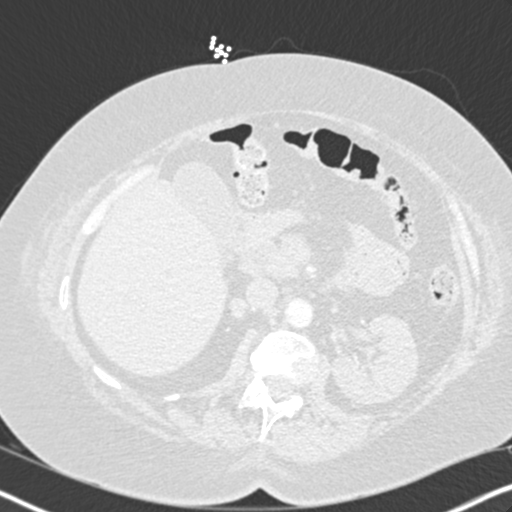
[im 38/290  soft-tissue]
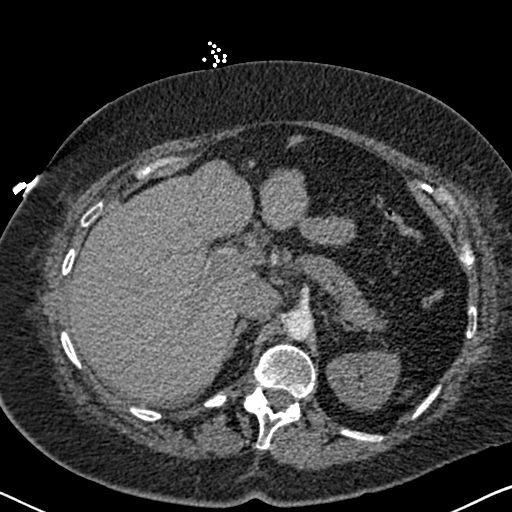
[im 51/290  lung]
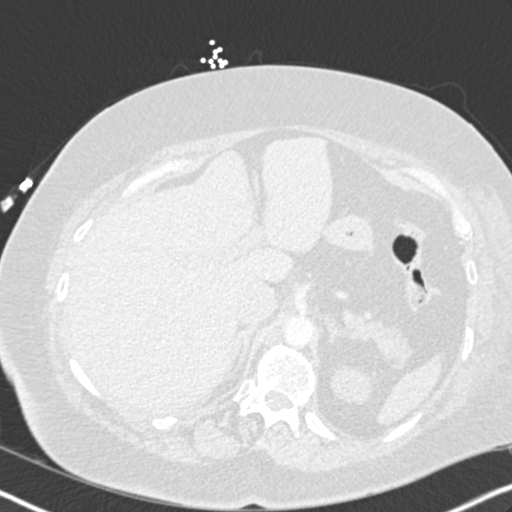
[im 63/290  soft-tissue]
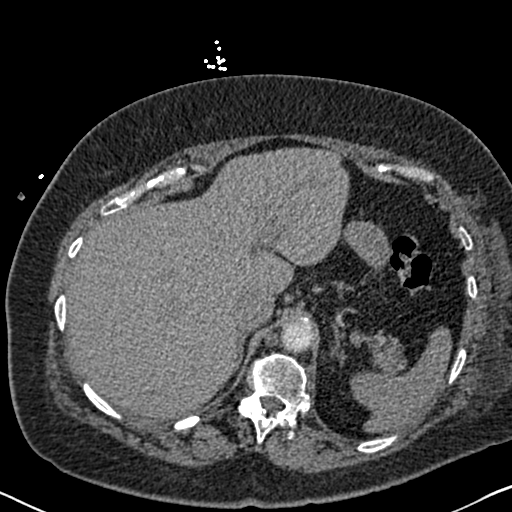
[im 88/290  lung]
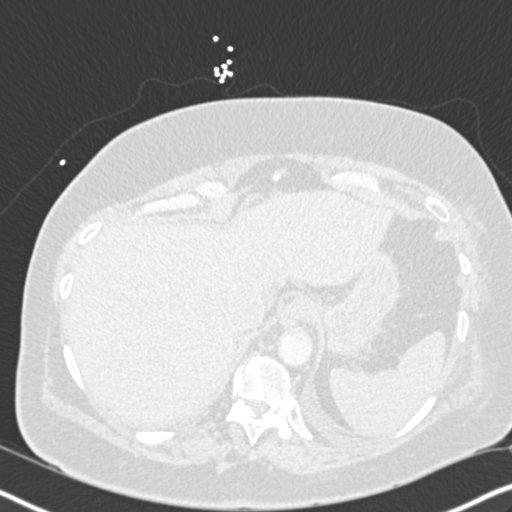
[im 101/290  soft-tissue]
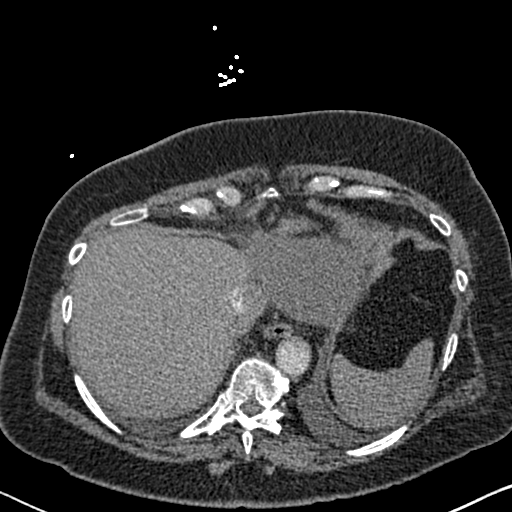
[im 114/290  lung]
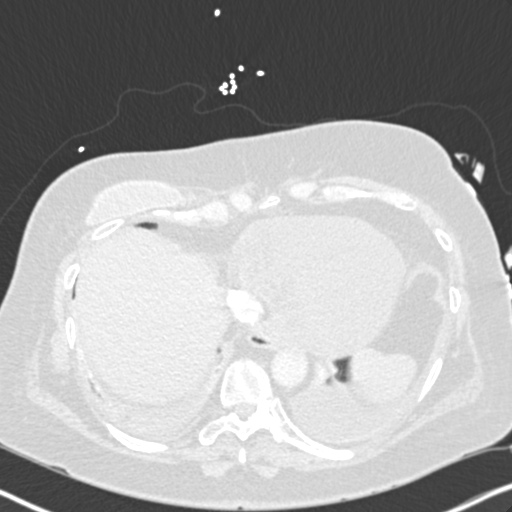
[im 139/290  soft-tissue]
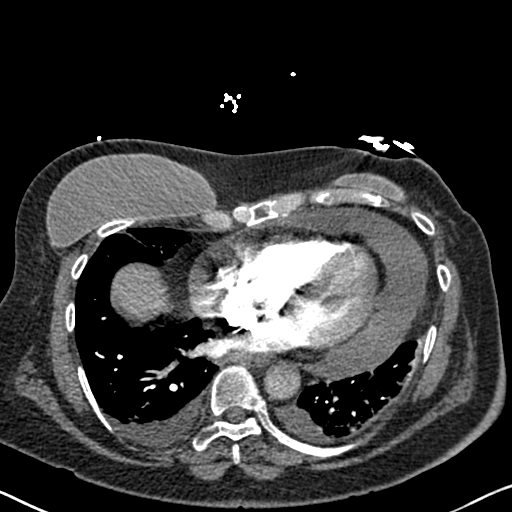
[im 151/290  lung]
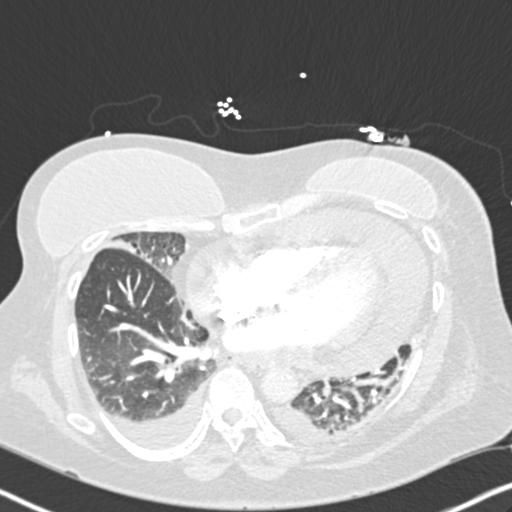
[im 176/290  soft-tissue]
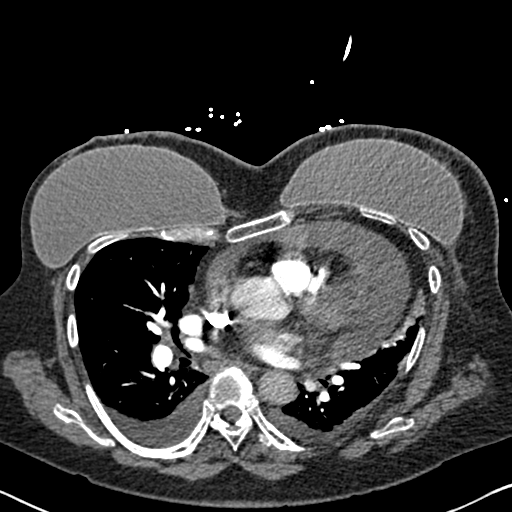
[im 189/290  lung]
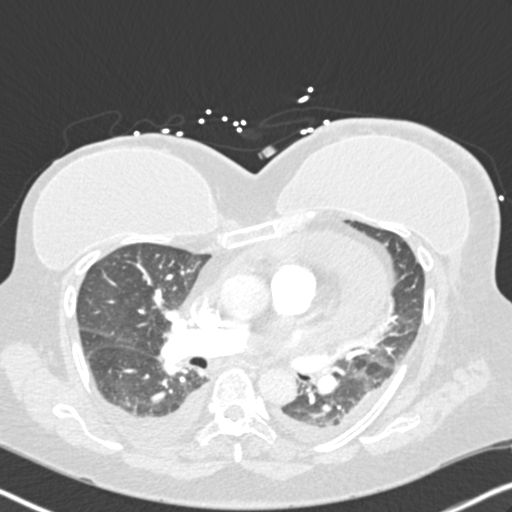
[im 202/290  soft-tissue]
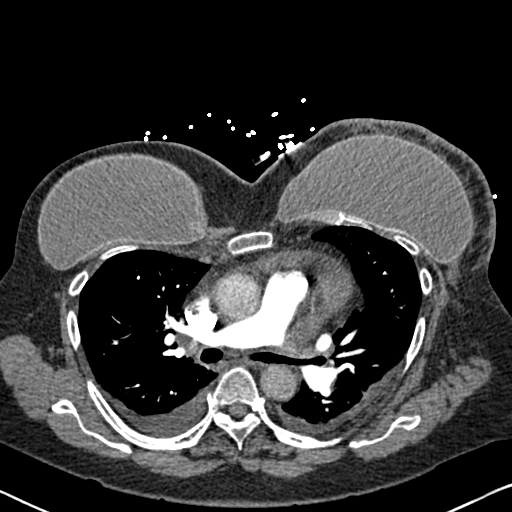
[im 227/290  lung]
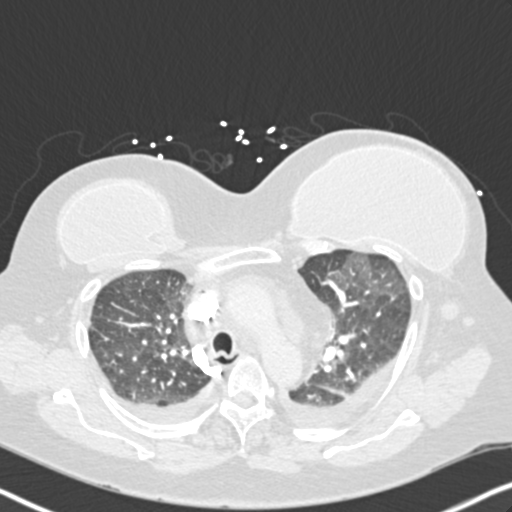
[im 239/290  soft-tissue]
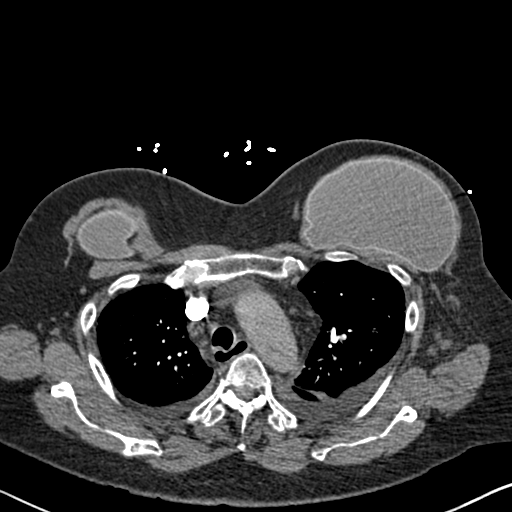
[im 252/290  lung]
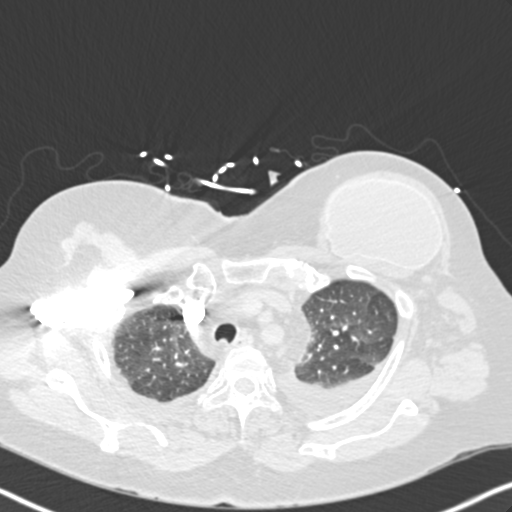
[im 277/290  soft-tissue]
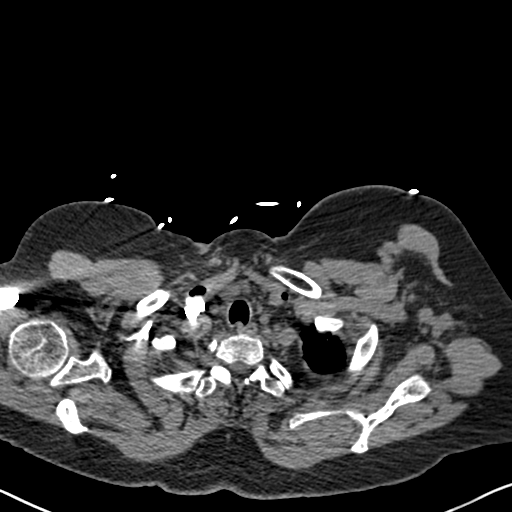

[Series 8: coronal mpr · coronal · 0.57mm/px · 2 of 99 slices shown]
[im 33/99  soft-tissue]
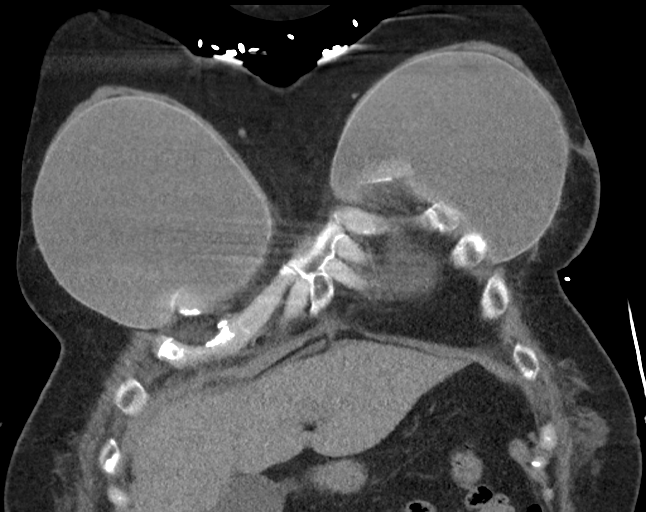
[im 66/99  soft-tissue]
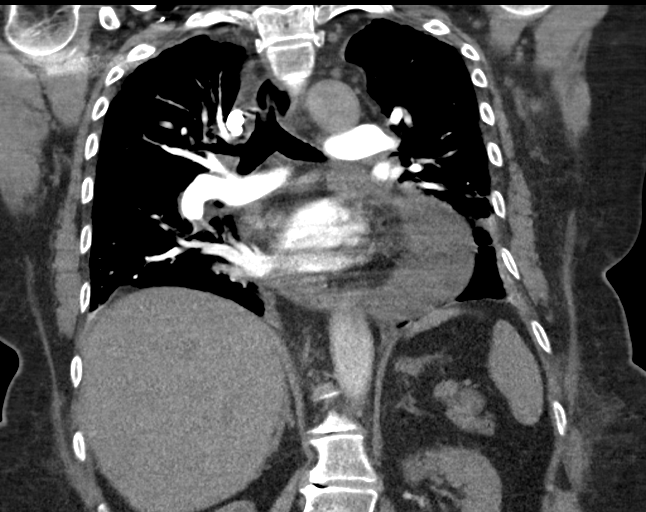

[18 of 46 positions shown; findings below may reference images not displayed]

FINDINGS: Cardiovascular: Large pericardial effusion, anteriorly measuring up
to 1.9 cm in thickness, 2.9 cm in thickness at the cardiac apex and
posteriorly at 2.2 cm in thickness. The heart is otherwise normal in
size. There is good opacification of the pulmonary arteries. No
pulmonary embolus is identified. No aortic aneurysm or dissection.
Minimal aortic atherosclerosis.

Mediastinum/Nodes: No enlarged mediastinal, hilar, or axillary lymph
nodes. Thyroid gland, trachea, and esophagus demonstrate no
significant findings.

Lungs/Pleura: Bilateral small to moderate pleural effusions with
adjacent atelectasis. No dominant mass. Faint bilateral ground-glass
opacities may represent stigmata of pulmonary edema or
hypoventilatory change among some possibilities though not
exclusive.

Upper Abdomen: Bilateral saline subglandular implants with mild
bilateral skin thickening and soft tissue induration about the left
breast. Clinical correlation recommended.

Musculoskeletal: Dextroscoliosis of the thoracic spine without acute
nor suspicious osseous abnormality.

Review of the MIP images confirms the above findings.
IMPRESSION: 1. Large pericardial effusion.  Cardiology assessment recommended.
2. Small bilateral pleural effusions with atelectasis and
ground-glass opacities likely representing stigmata of CHF.
3. No acute pulmonary embolus.
4. Bilateral skin thickening and subcutaneous fatty induration of
the left breast with underlying bilateral saline subglandular
implants. Clinical correlation recommended.

Aortic Atherosclerosis ([id number].V).

## 2019-07-02 ENCOUNTER — Other Ambulatory Visit: Payer: 59

## 2019-07-02 ENCOUNTER — Encounter: Payer: Self-pay | Admitting: Oncology

## 2019-07-02 NOTE — Progress Notes (Signed)
Patient prescreened for appointment. Patient has no concerns or questions.  

## 2019-07-02 NOTE — Progress Notes (Signed)
Tumor Board Documentation  Julie Irwin was presented by Dr Grayland Ormond at our Tumor Board on 07/02/2019, which included representatives from medical oncology, radiation oncology, pathology, radiology, surgical, navigation, internal medicine, genetics, research, palliative care, pulmonology.  Julie Irwin currently presents as a current patient, for discussion with history of the following treatments: active survellience, adjuvant chemotherapy.  Additionally, we reviewed previous medical and familial history, history of present illness, and recent lab results along with all available histopathologic and imaging studies. The tumor board considered available treatment options and made the following recommendations:   Continue Steroids  The following procedures/referrals were also placed: No orders of the defined types were placed in this encounter.   Clinical Trial Status: not discussed   Staging used: AJCC Stage Group  AJCC Staging:       Group: Stage 4 Breast Cancer with Brain mets   National site-specific guidelines NCCN were discussed with respect to the case.  Tumor board is a meeting of clinicians from various specialty areas who evaluate and discuss patients for whom a multidisciplinary approach is being considered. Final determinations in the plan of care are those of the provider(s). The responsibility for follow up of recommendations given during tumor board is that of the provider.   Today's extended care, comprehensive team conference, Julie Irwin was not present for the discussion and was not examined.   Multidisciplinary Tumor Board is a multidisciplinary case peer review process.  Decisions discussed in the Multidisciplinary Tumor Board reflect the opinions of the specialists present at the conference without having examined the patient.  Ultimately, treatment and diagnostic decisions rest with the primary provider(s) and the patient.

## 2019-07-03 ENCOUNTER — Inpatient Hospital Stay (HOSPITAL_BASED_OUTPATIENT_CLINIC_OR_DEPARTMENT_OTHER): Payer: No Typology Code available for payment source | Admitting: Oncology

## 2019-07-03 DIAGNOSIS — C50919 Malignant neoplasm of unspecified site of unspecified female breast: Secondary | ICD-10-CM
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
## 2019-07-08 ENCOUNTER — Other Ambulatory Visit: Payer: Self-pay | Admitting: Oncology
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
## 2019-07-14 ENCOUNTER — Encounter: Payer: Self-pay | Admitting: Oncology

## 2019-07-15 ENCOUNTER — Other Ambulatory Visit: Payer: Self-pay

## 2019-07-15 NOTE — Progress Notes (Signed)
Patient pre screened for office appointment, no questions or concerns today. Patient reminded of upcoming appointment time and date. 

## 2019-07-16 ENCOUNTER — Inpatient Hospital Stay: Payer: No Typology Code available for payment source

## 2019-07-16 ENCOUNTER — Inpatient Hospital Stay (HOSPITAL_BASED_OUTPATIENT_CLINIC_OR_DEPARTMENT_OTHER): Payer: No Typology Code available for payment source | Admitting: Hospice and Palliative Medicine

## 2019-07-16 ENCOUNTER — Inpatient Hospital Stay: Payer: No Typology Code available for payment source | Attending: Oncology | Admitting: Oncology

## 2019-07-16 ENCOUNTER — Encounter: Payer: Self-pay | Admitting: Oncology

## 2019-07-16 ENCOUNTER — Other Ambulatory Visit: Payer: Self-pay

## 2019-07-16 ENCOUNTER — Telehealth: Payer: Self-pay | Admitting: Emergency Medicine

## 2019-07-16 VITALS — BP 112/76 | HR 66 | Temp 97.0°F | Resp 20 | Wt 174.1 lb

## 2019-07-16 DIAGNOSIS — C7931 Secondary malignant neoplasm of brain: Secondary | ICD-10-CM | POA: Insufficient documentation

## 2019-07-16 DIAGNOSIS — C50919 Malignant neoplasm of unspecified site of unspecified female breast: Secondary | ICD-10-CM | POA: Diagnosis not present

## 2019-07-16 DIAGNOSIS — C50411 Malignant neoplasm of upper-outer quadrant of right female breast: Secondary | ICD-10-CM | POA: Diagnosis not present

## 2019-07-16 DIAGNOSIS — Z5112 Encounter for antineoplastic immunotherapy: Secondary | ICD-10-CM | POA: Diagnosis present

## 2019-07-16 DIAGNOSIS — Z515 Encounter for palliative care: Secondary | ICD-10-CM

## 2019-07-16 MED ORDER — ACETAMINOPHEN 325 MG PO TABS
650.0000 mg | ORAL_TABLET | Freq: Once | ORAL | Status: AC
  Administered 2019-07-16: 650 mg via ORAL
  Filled 2019-07-16: qty 2

## 2019-07-16 MED ORDER — SODIUM CHLORIDE 0.9 % IV SOLN
3.3000 mg/kg | Freq: Once | INTRAVENOUS | Status: AC
  Administered 2019-07-16: 260 mg via INTRAVENOUS
  Filled 2019-07-16: qty 8

## 2019-07-16 MED ORDER — DEXAMETHASONE 4 MG PO TABS: 4.0000 mg | ORAL_TABLET | Freq: Every day | ORAL | 1 refills | Status: AC

## 2019-07-16 MED ORDER — HEPARIN SOD (PORK) LOCK FLUSH 100 UNIT/ML IV SOLN
500.0000 [IU] | Freq: Once | INTRAVENOUS | Status: AC | PRN
  Administered 2019-07-16: 500 [IU]
  Filled 2019-07-16: qty 5

## 2019-07-16 MED ORDER — DIPHENHYDRAMINE HCL 25 MG PO CAPS
25.0000 mg | ORAL_CAPSULE | Freq: Once | ORAL | Status: AC
  Administered 2019-07-16: 25 mg via ORAL
  Filled 2019-07-16: qty 1

## 2019-07-16 MED ORDER — SODIUM CHLORIDE 0.9 % IV SOLN
Freq: Once | INTRAVENOUS | Status: AC
  Administered 2019-07-16: 11:00:00 via INTRAVENOUS
  Filled 2019-07-16: qty 250

## 2019-07-16 NOTE — Progress Notes (Signed)
Patient is here for a follow-up today with concerns about her cognitive issues.

## 2019-07-16 NOTE — Telephone Encounter (Signed)
Spoke with pt's son Lovena Le to let him know of change to dexamethasone dosage. Lovena Le stated that pt has a virtual visit with [facility] tomorrow. No further questions or concerns at this time. Reminded Lovena Le that they can always call the office if they have any questions or concerns.
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
## 2019-07-23 ENCOUNTER — Telehealth: Payer: Self-pay | Admitting: *Deleted

## 2019-07-23 NOTE — Telephone Encounter (Signed)
Yes that is fine

## 2019-07-23 NOTE — Telephone Encounter (Signed)
Son Lovena Le called reporting that patient has had multiple falls and is requesting referral for PT for strengthening and ambulation

## 2019-07-24 ENCOUNTER — Other Ambulatory Visit: Payer: Self-pay | Admitting: Oncology

## 2019-07-24 ENCOUNTER — Other Ambulatory Visit: Payer: Self-pay

## 2019-07-24 ENCOUNTER — Telehealth: Payer: Self-pay

## 2019-07-24 DIAGNOSIS — M8580 Other specified disorders of bone density and structure, unspecified site: Secondary | ICD-10-CM

## 2019-07-24 DIAGNOSIS — C50919 Malignant neoplasm of unspecified site of unspecified female breast: Secondary | ICD-10-CM

## 2019-07-24 DIAGNOSIS — R296 Repeated falls: Secondary | ICD-10-CM

## 2019-07-24 NOTE — Telephone Encounter (Signed)
Olivia Mackie sent it this morning and has notified Lovena Le of same

## 2019-07-24 NOTE — Progress Notes (Unsigned)
I have talk to [place] and he is going to take care of the referral for PT.  [facility], [facility] and [facility] can not accept her, I have spoke with son and referall has been sent to [facility] on [address]

## 2019-07-24 NOTE — Telephone Encounter (Signed)
Sarah,  Can you send the referral for PT
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
## 2019-08-03 ENCOUNTER — Other Ambulatory Visit: Payer: Self-pay | Admitting: Emergency Medicine

## 2019-08-05 ENCOUNTER — Encounter: Payer: Self-pay | Admitting: Oncology

## 2019-08-06 ENCOUNTER — Inpatient Hospital Stay: Payer: No Typology Code available for payment source

## 2019-08-06 ENCOUNTER — Telehealth: Payer: Self-pay | Admitting: Adult Health Nurse Practitioner

## 2019-08-06 ENCOUNTER — Inpatient Hospital Stay (HOSPITAL_BASED_OUTPATIENT_CLINIC_OR_DEPARTMENT_OTHER): Payer: No Typology Code available for payment source | Admitting: Oncology

## 2019-08-06 ENCOUNTER — Inpatient Hospital Stay (HOSPITAL_BASED_OUTPATIENT_CLINIC_OR_DEPARTMENT_OTHER): Payer: No Typology Code available for payment source | Admitting: Hospice and Palliative Medicine

## 2019-08-06 ENCOUNTER — Other Ambulatory Visit: Payer: Self-pay

## 2019-08-06 VITALS — BP 101/68 | HR 74 | Temp 97.0°F | Resp 20

## 2019-08-06 DIAGNOSIS — Z7189 Other specified counseling: Secondary | ICD-10-CM | POA: Diagnosis not present

## 2019-08-06 DIAGNOSIS — C50919 Malignant neoplasm of unspecified site of unspecified female breast: Secondary | ICD-10-CM

## 2019-08-06 DIAGNOSIS — Z515 Encounter for palliative care: Secondary | ICD-10-CM | POA: Diagnosis not present

## 2019-08-06 DIAGNOSIS — R531 Weakness: Secondary | ICD-10-CM

## 2019-08-06 DIAGNOSIS — Z5112 Encounter for antineoplastic immunotherapy: Secondary | ICD-10-CM | POA: Diagnosis not present

## 2019-08-06 DIAGNOSIS — C50411 Malignant neoplasm of upper-outer quadrant of right female breast: Secondary | ICD-10-CM

## 2019-08-06 MED ORDER — HEPARIN SOD (PORK) LOCK FLUSH 100 UNIT/ML IV SOLN
INTRAVENOUS | Status: AC
  Filled 2019-08-06: qty 5

## 2019-08-06 MED ORDER — ACETAMINOPHEN 325 MG PO TABS
650.0000 mg | ORAL_TABLET | Freq: Once | ORAL | Status: AC
  Administered 2019-08-06: 650 mg via ORAL
  Filled 2019-08-06: qty 2

## 2019-08-06 MED ORDER — DIPHENHYDRAMINE HCL 25 MG PO CAPS
25.0000 mg | ORAL_CAPSULE | Freq: Once | ORAL | Status: AC
  Administered 2019-08-06: 25 mg via ORAL
  Filled 2019-08-06: qty 1

## 2019-08-06 MED ORDER — SODIUM CHLORIDE 0.9 % IV SOLN
3.3000 mg/kg | Freq: Once | INTRAVENOUS | Status: AC
  Administered 2019-08-06: 260 mg via INTRAVENOUS
  Filled 2019-08-06: qty 5

## 2019-08-06 MED ORDER — HEPARIN SOD (PORK) LOCK FLUSH 100 UNIT/ML IV SOLN
500.0000 [IU] | Freq: Once | INTRAVENOUS | Status: AC | PRN
  Administered 2019-08-06: 500 [IU]
  Filled 2019-08-06: qty 5

## 2019-08-06 MED ORDER — SODIUM CHLORIDE 0.9 % IV SOLN
Freq: Once | INTRAVENOUS | Status: AC
  Filled 2019-08-06: qty 250

## 2019-08-06 NOTE — Telephone Encounter (Signed)
Called patient to schedule Palliative Consult, no answer - left message with reason for call along with my contact information. °
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
## 2019-08-13 ENCOUNTER — Ambulatory Visit: Payer: Managed Care, Other (non HMO)
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
## 2019-08-24 ENCOUNTER — Other Ambulatory Visit: Payer: Self-pay | Admitting: Internal Medicine

## 2019-08-24 DIAGNOSIS — H539 Unspecified visual disturbance: Secondary | ICD-10-CM

## 2019-08-24 DIAGNOSIS — W19XXXS Unspecified fall, sequela: Secondary | ICD-10-CM

## 2019-08-24 DIAGNOSIS — C7931 Secondary malignant neoplasm of brain: Secondary | ICD-10-CM

## 2019-08-26 ENCOUNTER — Telehealth: Payer: Self-pay | Admitting: Adult Health Nurse Practitioner

## 2019-08-26 NOTE — Telephone Encounter (Signed)
Spoke with patient's son Lovena Le regarding Palliative services and he was in agreement with this.  I have scheduled a [place] for 09/03/19 @ 10 AM.

## 2019-08-27 ENCOUNTER — Inpatient Hospital Stay (HOSPITAL_BASED_OUTPATIENT_CLINIC_OR_DEPARTMENT_OTHER): Payer: No Typology Code available for payment source | Admitting: Hospice and Palliative Medicine

## 2019-08-27 ENCOUNTER — Inpatient Hospital Stay: Payer: No Typology Code available for payment source | Attending: Oncology | Admitting: Oncology

## 2019-08-27 ENCOUNTER — Inpatient Hospital Stay: Payer: No Typology Code available for payment source

## 2019-08-27 ENCOUNTER — Telehealth: Payer: Self-pay | Admitting: *Deleted

## 2019-08-27 ENCOUNTER — Telehealth: Payer: Self-pay | Admitting: Emergency Medicine

## 2019-08-27 ENCOUNTER — Other Ambulatory Visit: Payer: Self-pay

## 2019-08-27 VITALS — BP 117/83 | HR 77 | Temp 97.0°F | Resp 20

## 2019-08-27 DIAGNOSIS — Z515 Encounter for palliative care: Secondary | ICD-10-CM

## 2019-08-27 DIAGNOSIS — Z5112 Encounter for antineoplastic immunotherapy: Secondary | ICD-10-CM | POA: Diagnosis present

## 2019-08-27 DIAGNOSIS — C50411 Malignant neoplasm of upper-outer quadrant of right female breast: Secondary | ICD-10-CM

## 2019-08-27 DIAGNOSIS — C7931 Secondary malignant neoplasm of brain: Secondary | ICD-10-CM | POA: Diagnosis not present

## 2019-08-27 MED ORDER — SODIUM CHLORIDE 0.9 % IV SOLN
Freq: Once | INTRAVENOUS | Status: AC
  Filled 2019-08-27: qty 250

## 2019-08-27 MED ORDER — ACETAMINOPHEN 325 MG PO TABS
650.0000 mg | ORAL_TABLET | Freq: Once | ORAL | Status: AC
  Administered 2019-08-27: 11:00:00 650 mg via ORAL
  Filled 2019-08-27: qty 2

## 2019-08-27 MED ORDER — SODIUM CHLORIDE 0.9 % IV SOLN
3.3000 mg/kg | Freq: Once | INTRAVENOUS | Status: AC
  Administered 2019-08-27: 260 mg via INTRAVENOUS
  Filled 2019-08-27: qty 8

## 2019-08-27 MED ORDER — HEPARIN SOD (PORK) LOCK FLUSH 100 UNIT/ML IV SOLN
500.0000 [IU] | Freq: Once | INTRAVENOUS | Status: AC | PRN
  Administered 2019-08-27: 500 [IU]
  Filled 2019-08-27: qty 5

## 2019-08-27 MED ORDER — HEPARIN SOD (PORK) LOCK FLUSH 100 UNIT/ML IV SOLN
INTRAVENOUS | Status: AC
  Filled 2019-08-27: qty 5

## 2019-08-27 MED ORDER — DIPHENHYDRAMINE HCL 25 MG PO CAPS
25.0000 mg | ORAL_CAPSULE | Freq: Once | ORAL | Status: AC
  Administered 2019-08-27: 25 mg via ORAL
  Filled 2019-08-27: qty 1

## 2019-08-27 NOTE — Telephone Encounter (Signed)
Patient's sone called to inquire about his mother's appointment schedule he states that she has not had her labs drawn for her appointment today. He would like to be advised on how to proceed.
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
## 2019-08-27 NOTE — Telephone Encounter (Signed)
I'm waiting for Dr. Grayland Ormond to reply.  Thank you Jerene Pitch

## 2019-08-27 NOTE — Telephone Encounter (Signed)
Called and spoke with pt's son, Lovena Le. Relayed that Dr. Grayland Ormond is ok with not having labwork for this week's treatment. Lovena Le expressed that he had been told by Merrily Pew, NP that he would be getting a MOST form to look at. Looking at notes, it appears that it was given to the pt to take home. Lovena Le reported it was never given to him. Plans made with Lovena Le to mail all important paperwork, as he had no received the last AVS and did not know about this appointment. Lovena Le reported they would be here by 10. Plan is for Dr. Grayland Ormond to see pt in infusion, Lauren, infusion RN aware and aware that pt has not had labs.

## 2019-09-01 ENCOUNTER — Other Ambulatory Visit: Payer: Self-pay

## 2019-09-01 ENCOUNTER — Ambulatory Visit
Admission: RE | Admit: 2019-09-01 | Discharge: 2019-09-01 | Disposition: A | Discharge status: Home / Self Care | Payer: No Typology Code available for payment source | Source: Ambulatory Visit | Attending: Internal Medicine | Admitting: Internal Medicine

## 2019-09-01 DIAGNOSIS — C801 Malignant (primary) neoplasm, unspecified: Secondary | ICD-10-CM | POA: Insufficient documentation

## 2019-09-01 DIAGNOSIS — G9389 Other specified disorders of brain: Secondary | ICD-10-CM | POA: Insufficient documentation

## 2019-09-01 DIAGNOSIS — H539 Unspecified visual disturbance: Secondary | ICD-10-CM | POA: Diagnosis not present

## 2019-09-01 DIAGNOSIS — C7931 Secondary malignant neoplasm of brain: Secondary | ICD-10-CM | POA: Diagnosis present

## 2019-09-01 DIAGNOSIS — Y92009 Unspecified place in unspecified non-institutional (private) residence as the place of occurrence of the external cause: Secondary | ICD-10-CM | POA: Diagnosis not present

## 2019-09-01 DIAGNOSIS — W19XXXS Unspecified fall, sequela: Secondary | ICD-10-CM | POA: Diagnosis not present

## 2019-09-03 ENCOUNTER — Other Ambulatory Visit: Payer: Self-pay

## 2019-09-03 ENCOUNTER — Other Ambulatory Visit: Payer: No Typology Code available for payment source | Admitting: Adult Health Nurse Practitioner

## 2019-09-08 ENCOUNTER — Ambulatory Visit: Admission: RE | Admit: 2019-09-08 | Payer: No Typology Code available for payment source | Source: Ambulatory Visit

## 2019-09-09 ENCOUNTER — Other Ambulatory Visit: Payer: No Typology Code available for payment source | Admitting: Adult Health Nurse Practitioner

## 2019-09-09 ENCOUNTER — Other Ambulatory Visit: Payer: Self-pay

## 2019-09-09 ENCOUNTER — Telehealth: Payer: Self-pay

## 2019-09-09 DIAGNOSIS — Z515 Encounter for palliative care: Secondary | ICD-10-CM

## 2019-09-09 DIAGNOSIS — C50919 Malignant neoplasm of unspecified site of unspecified female breast: Secondary | ICD-10-CM

## 2019-09-09 NOTE — Telephone Encounter (Signed)
Spoke with Amy, Palliative NP, who directed that patient's son verbalized need for transport wheelchair, a shower chair, and grab bars for the bathroom. Son stated that patient has Physical Therapy currently through [facility]. [place] regarding request for DME.
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
## 2019-09-15 ENCOUNTER — Encounter: Payer: Self-pay | Admitting: Oncology

## 2019-09-17 ENCOUNTER — Inpatient Hospital Stay: Payer: No Typology Code available for payment source | Attending: Oncology | Admitting: Oncology

## 2019-09-17 ENCOUNTER — Inpatient Hospital Stay: Payer: No Typology Code available for payment source

## 2019-09-17 ENCOUNTER — Other Ambulatory Visit: Payer: Self-pay

## 2019-09-17 ENCOUNTER — Encounter: Payer: Self-pay | Admitting: Oncology

## 2019-09-17 VITALS — BP 108/65 | HR 70 | Temp 96.5°F | Resp 17

## 2019-09-17 VITALS — BP 117/71 | HR 80 | Temp 97.7°F | Resp 16 | Wt 177.1 lb

## 2019-09-17 DIAGNOSIS — C50411 Malignant neoplasm of upper-outer quadrant of right female breast: Secondary | ICD-10-CM | POA: Diagnosis present

## 2019-09-17 DIAGNOSIS — C7931 Secondary malignant neoplasm of brain: Secondary | ICD-10-CM | POA: Diagnosis not present

## 2019-09-17 DIAGNOSIS — Z5112 Encounter for antineoplastic immunotherapy: Secondary | ICD-10-CM | POA: Diagnosis present

## 2019-09-17 MED ORDER — HEPARIN SOD (PORK) LOCK FLUSH 100 UNIT/ML IV SOLN
500.0000 [IU] | Freq: Once | INTRAVENOUS | Status: AC | PRN
  Administered 2019-09-17: 500 [IU]
  Filled 2019-09-17: qty 5

## 2019-09-17 MED ORDER — ACETAMINOPHEN 325 MG PO TABS
650.0000 mg | ORAL_TABLET | Freq: Once | ORAL | Status: AC
  Administered 2019-09-17: 650 mg via ORAL
  Filled 2019-09-17: qty 2

## 2019-09-17 MED ORDER — SODIUM CHLORIDE 0.9 % IV SOLN
Freq: Once | INTRAVENOUS | Status: AC
  Filled 2019-09-17: qty 250

## 2019-09-17 MED ORDER — DIPHENHYDRAMINE HCL 25 MG PO CAPS
25.0000 mg | ORAL_CAPSULE | Freq: Once | ORAL | Status: AC
  Administered 2019-09-17: 25 mg via ORAL
  Filled 2019-09-17: qty 1

## 2019-09-17 MED ORDER — SODIUM CHLORIDE 0.9 % IV SOLN
3.3000 mg/kg | Freq: Once | INTRAVENOUS | Status: AC
  Administered 2019-09-17: 260 mg via INTRAVENOUS
  Filled 2019-09-17: qty 8

## 2019-09-17 NOTE — Progress Notes (Signed)
Per Dr. Grayland Ormond okay to proceed with ALT of 90 and Echo from 03/11/19. Pt to be scheduled for echo in the next two weeks per Dr. Grayland Ormond.   1140: Pt denies to stay for 30 minutes post infusion observation. Pt denies any concerns at this time, no s/s of distress noted. Pt tolerated infusion well. Pt and VS stable at discharge.

## 2019-09-17 NOTE — Progress Notes (Signed)
Pt here for follow up. Reports not sleeping well, but no other complaints or concerns.

## 2019-09-22 ENCOUNTER — Other Ambulatory Visit: Payer: Self-pay

## 2019-09-23 ENCOUNTER — Inpatient Hospital Stay (HOSPITAL_BASED_OUTPATIENT_CLINIC_OR_DEPARTMENT_OTHER): Payer: No Typology Code available for payment source | Admitting: Hospice and Palliative Medicine

## 2019-09-23 DIAGNOSIS — Z515 Encounter for palliative care: Secondary | ICD-10-CM

## 2019-09-23 DIAGNOSIS — C50411 Malignant neoplasm of upper-outer quadrant of right female breast: Secondary | ICD-10-CM

## 2019-09-23 NOTE — Progress Notes (Signed)
Was unable to reach patient by phone at time of scheduled visit.  Voicemail left.  Will reschedule visit.

## 2019-09-24 ENCOUNTER — Ambulatory Visit: Admission: RE | Admit: 2019-09-24 | Payer: No Typology Code available for payment source | Source: Ambulatory Visit
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
## 2019-10-05 ENCOUNTER — Ambulatory Visit
Admission: RE | Admit: 2019-10-05 | Discharge: 2019-10-05 | Disposition: A | Discharge status: Home / Self Care | Payer: No Typology Code available for payment source | Source: Ambulatory Visit | Attending: Oncology | Admitting: Oncology

## 2019-10-05 ENCOUNTER — Other Ambulatory Visit: Payer: Self-pay

## 2019-10-05 DIAGNOSIS — C50411 Malignant neoplasm of upper-outer quadrant of right female breast: Secondary | ICD-10-CM | POA: Insufficient documentation

## 2019-10-05 MED ORDER — HEPARIN SOD (PORK) LOCK FLUSH 100 UNIT/ML IV SOLN
500.0000 [IU] | INTRAVENOUS | Status: DC | PRN
  Filled 2019-10-05: qty 5

## 2019-10-05 MED ORDER — IOHEXOL 300 MG/ML  SOLN
100.0000 mL | Freq: Once | INTRAMUSCULAR | Status: AC | PRN
  Administered 2019-10-05: 10:00:00 100 mL via INTRAVENOUS

## 2019-10-07 ENCOUNTER — Telehealth: Payer: Self-pay | Admitting: Adult Health Nurse Practitioner

## 2019-10-07 ENCOUNTER — Telehealth: Payer: Self-pay | Admitting: *Deleted

## 2019-10-07 LAB — POCT I-STAT CREATININE: Creatinine, Ser: 0.6 mg/dL (ref 0.44–1.00)

## 2019-10-07 NOTE — Telephone Encounter (Signed)
Called to schedule follow up appointment.  Left VM with reason for call and call back number. Damya Comley K. Olena Heckle NP

## 2019-10-08 ENCOUNTER — Inpatient Hospital Stay: Payer: No Typology Code available for payment source

## 2019-10-08 ENCOUNTER — Inpatient Hospital Stay (HOSPITAL_BASED_OUTPATIENT_CLINIC_OR_DEPARTMENT_OTHER): Payer: No Typology Code available for payment source | Admitting: Oncology

## 2019-10-08 ENCOUNTER — Other Ambulatory Visit: Payer: Self-pay | Admitting: Oncology

## 2019-10-08 ENCOUNTER — Other Ambulatory Visit: Payer: Self-pay

## 2019-10-08 ENCOUNTER — Encounter: Payer: Self-pay | Admitting: Oncology

## 2019-10-08 ENCOUNTER — Inpatient Hospital Stay (HOSPITAL_BASED_OUTPATIENT_CLINIC_OR_DEPARTMENT_OTHER): Payer: No Typology Code available for payment source | Admitting: Hospice and Palliative Medicine

## 2019-10-08 VITALS — BP 127/88 | HR 72 | Temp 97.2°F | Wt 180.3 lb

## 2019-10-08 DIAGNOSIS — Z5112 Encounter for antineoplastic immunotherapy: Secondary | ICD-10-CM | POA: Diagnosis not present

## 2019-10-08 DIAGNOSIS — Z515 Encounter for palliative care: Secondary | ICD-10-CM | POA: Diagnosis not present

## 2019-10-08 DIAGNOSIS — C50411 Malignant neoplasm of upper-outer quadrant of right female breast: Secondary | ICD-10-CM

## 2019-10-08 MED ORDER — HEPARIN SOD (PORK) LOCK FLUSH 100 UNIT/ML IV SOLN
500.0000 [IU] | Freq: Once | INTRAVENOUS | Status: AC | PRN
  Administered 2019-10-08: 500 [IU]
  Filled 2019-10-08: qty 5

## 2019-10-08 MED ORDER — SODIUM CHLORIDE 0.9 % IV SOLN
Freq: Once | INTRAVENOUS | Status: AC
  Filled 2019-10-08: qty 250

## 2019-10-08 MED ORDER — SODIUM CHLORIDE 0.9 % IV SOLN
3.3000 mg/kg | Freq: Once | INTRAVENOUS | Status: AC
  Administered 2019-10-08: 260 mg via INTRAVENOUS
  Filled 2019-10-08: qty 8

## 2019-10-08 MED ORDER — SODIUM CHLORIDE 0.9% FLUSH
10.0000 mL | INTRAVENOUS | Status: DC | PRN
  Administered 2019-10-08: 10 mL
  Filled 2019-10-08: qty 10

## 2019-10-08 MED ORDER — DIPHENHYDRAMINE HCL 25 MG PO CAPS
25.0000 mg | ORAL_CAPSULE | Freq: Once | ORAL | Status: AC
  Administered 2019-10-08: 25 mg via ORAL
  Filled 2019-10-08: qty 1

## 2019-10-08 MED ORDER — ACETAMINOPHEN 325 MG PO TABS
650.0000 mg | ORAL_TABLET | Freq: Once | ORAL | Status: AC
  Administered 2019-10-08: 650 mg via ORAL
  Filled 2019-10-08: qty 2
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
## 2019-10-08 NOTE — Progress Notes (Signed)
Last ECHO 03/11/2019  Per MD ok to treat today. Patient has no showed several times for echo and will not be treated again until study is done.

## 2019-10-09 ENCOUNTER — Encounter: Payer: Self-pay | Admitting: Oncology

## 2019-10-21 ENCOUNTER — Other Ambulatory Visit: Payer: Self-pay | Admitting: Oncology

## 2019-10-28 ENCOUNTER — Ambulatory Visit: Admission: RE | Admit: 2019-10-28 | Payer: Self-pay | Source: Ambulatory Visit

## 2019-10-29 ENCOUNTER — Other Ambulatory Visit: Payer: No Typology Code available for payment source

## 2019-10-29 ENCOUNTER — Inpatient Hospital Stay: Payer: PRIVATE HEALTH INSURANCE

## 2019-10-29 ENCOUNTER — Inpatient Hospital Stay: Payer: PRIVATE HEALTH INSURANCE | Admitting: Oncology

## 2019-10-29 ENCOUNTER — Telehealth: Payer: Self-pay | Admitting: Emergency Medicine

## 2019-10-29 NOTE — Telephone Encounter (Signed)
Called pt's son Julie Irwin as patient did not come to appt and missed echo on 3/17. Julie Irwin said he was not aware of echo and today's appt slipped his mind. He meant to call earlier this week to confirm appt, but he didn't have a chance to. Asked Julie Irwin what days and times would be better to schedule echo, and told him that we wouldn't be able to continue treatment until she had echo completed. Days and times provided to scheduler, scheduler to call with new echo day and time. Julie Irwin also asked if there was any way that he could get some financial assistance to help pay for the echo, as well as some medications Julie Irwin is on. I told Julie Irwin that I would reach out to [facility] to see if there was any assistance that we could provide. Pt's son verbalized understanding and didn't have any further questions or concerns.

## 2019-10-30 ENCOUNTER — Encounter: Payer: Self-pay | Admitting: Pharmacy Technician

## 2019-10-30 NOTE — Progress Notes (Signed)
Patient has been approved for drug assistance by [place] for [place]. The enrollment period is from 10/30/19-indefinite based on self pay. First DOS covered is TBA (11/26/19).

## 2019-10-30 NOTE — Progress Notes (Signed)
This encounter was created in error - please disregard.

## 2019-11-05 ENCOUNTER — Emergency Department: Payer: PRIVATE HEALTH INSURANCE

## 2019-11-05 ENCOUNTER — Other Ambulatory Visit: Payer: Self-pay

## 2019-11-05 ENCOUNTER — Telehealth: Payer: Self-pay | Admitting: Emergency Medicine

## 2019-11-05 ENCOUNTER — Inpatient Hospital Stay
Payer: PRIVATE HEALTH INSURANCE | Attending: Hospice and Palliative Medicine | Admitting: Hospice and Palliative Medicine

## 2019-11-05 ENCOUNTER — Emergency Department
Admission: EM | Admit: 2019-11-05 | Discharge: 2019-11-06 | Disposition: A | Discharge status: Other Acute Inpatient Hospital | Payer: PRIVATE HEALTH INSURANCE | Attending: Student in an Organized Health Care Education/Training Program | Admitting: Student in an Organized Health Care Education/Training Program

## 2019-11-05 DIAGNOSIS — Z20822 Contact with and (suspected) exposure to covid-19: Secondary | ICD-10-CM | POA: Insufficient documentation

## 2019-11-05 DIAGNOSIS — G9389 Other specified disorders of brain: Secondary | ICD-10-CM

## 2019-11-05 DIAGNOSIS — C7931 Secondary malignant neoplasm of brain: Secondary | ICD-10-CM | POA: Diagnosis not present

## 2019-11-05 DIAGNOSIS — C50919 Malignant neoplasm of unspecified site of unspecified female breast: Secondary | ICD-10-CM | POA: Insufficient documentation

## 2019-11-05 DIAGNOSIS — R4182 Altered mental status, unspecified: Secondary | ICD-10-CM | POA: Diagnosis not present

## 2019-11-05 DIAGNOSIS — Z515 Encounter for palliative care: Secondary | ICD-10-CM

## 2019-11-05 LAB — CBC WITH DIFFERENTIAL/PLATELET
Abs Immature Granulocytes: 0.04 10*3/uL (ref 0.00–0.07)
Basophils Absolute: 0.1 10*3/uL (ref 0.0–0.1)
Basophils Relative: 1 %
Eosinophils Absolute: 0.1 10*3/uL (ref 0.0–0.5)
Eosinophils Relative: 2 %
HCT: 42.3 % (ref 36.0–46.0)
Hemoglobin: 13.6 g/dL (ref 12.0–15.0)
Immature Granulocytes: 1 %
Lymphocytes Relative: 22 %
Lymphs Abs: 1.5 10*3/uL (ref 0.7–4.0)
MCH: 33.3 pg (ref 26.0–34.0)
MCHC: 32.2 g/dL (ref 30.0–36.0)
MCV: 103.7 fL — ABNORMAL HIGH (ref 80.0–100.0)
Monocytes Absolute: 1 10*3/uL (ref 0.1–1.0)
Monocytes Relative: 15 %
Neutro Abs: 3.9 10*3/uL (ref 1.7–7.7)
Neutrophils Relative %: 59 %
Platelets: 225 10*3/uL (ref 150–400)
RBC: 4.08 MIL/uL (ref 3.87–5.11)
RDW: 14.4 % (ref 11.5–15.5)
WBC: 6.6 10*3/uL (ref 4.0–10.5)
nRBC: 0 % (ref 0.0–0.2)

## 2019-11-05 LAB — BASIC METABOLIC PANEL
Anion gap: 11 (ref 5–15)
BUN: 8 mg/dL (ref 8–23)
CO2: 23 mmol/L (ref 22–32)
Calcium: 9.1 mg/dL (ref 8.9–10.3)
Chloride: 106 mmol/L (ref 98–111)
Creatinine, Ser: 0.71 mg/dL (ref 0.44–1.00)
GFR calc Af Amer: >60 mL/min (ref >60)
GFR calc non Af Amer: >60 mL/min (ref >60)
Glucose, Bld: 123 mg/dL — ABNORMAL HIGH (ref 70–99)
Potassium: 4 mmol/L (ref 3.5–5.1)
Sodium: 140 mmol/L (ref 135–145)

## 2019-11-05 MED ORDER — FENTANYL CITRATE (PF) 100 MCG/2ML IJ SOLN
50.0000 ug | Freq: Once | INTRAMUSCULAR | Status: AC
  Administered 2019-11-05: 50 ug via INTRAVENOUS
  Filled 2019-11-05: qty 2

## 2019-11-05 MED ORDER — LORAZEPAM 2 MG/ML IJ SOLN
1.0000 mg | Freq: Once | INTRAMUSCULAR | Status: AC
  Administered 2019-11-05: 1 mg via INTRAVENOUS
  Filled 2019-11-05: qty 1

## 2019-11-05 MED ORDER — DEXAMETHASONE SODIUM PHOSPHATE 10 MG/ML IJ SOLN
10.0000 mg | Freq: Once | INTRAMUSCULAR | Status: AC
  Administered 2019-11-05: 10 mg via INTRAVENOUS
  Filled 2019-11-05: qty 1

## 2019-11-05 MED ORDER — GADOBUTROL 1 MMOL/ML IV SOLN
6.0000 mL | Freq: Once | INTRAVENOUS | Status: AC | PRN
  Administered 2019-11-05: 6 mL via INTRAVENOUS

## 2019-11-05 NOTE — ED Notes (Signed)
Pt transported to CT ?

## 2019-11-05 NOTE — ED Notes (Signed)
PAM to powershare images to [facility] @ [facility]

## 2019-11-05 NOTE — Telephone Encounter (Signed)
Called returned to patient's son Julie Irwin. Julie Irwin reports that for the last week pt has been progressively weaker on the right side, but several hours ago pt seemingly went into a daze and has been unable to speak since. Pt unable to grip with right hand when asked, no effort against gravity per son. Julie Irwin directed to call 911 and let them know symptoms and history of brain mets. Julie Irwin verbalized understanding and said he would do so right away and ended call.

## 2019-11-05 NOTE — ED Notes (Signed)
Pt is resting on stretcher and appears comfortable. Respirations are equal and unlabored, no signs of acute distress.

## 2019-11-05 NOTE — ED Triage Notes (Addendum)
Pt to ED via [facility] from home. Per EMS pt with AMS per family. Per EMS pt with hx metastatic breast cancer that has metastasized to the brain. Per family pt has been declining since September. Per family pt has been unable to use right side since fall 2 weeks ago. Per EMS pt baseline is mumble speech but for the past hour pt has been nonverbal.   EMS VS: BP 120/66 CBG 155 Temp 98.2 O2 99%  Upon arrival pt nonverbal. Pt afebrile. VSS.

## 2019-11-05 NOTE — ED Provider Notes (Addendum)
Discussed results of MRI with patient and her son.  Case was discussed with oncology here at this facility as well as neurosurgery.  After discussion with Dr. Janese Banks as well as Dr. Izora Ribas plan will be to transfer to [facility] for admission.  I did start IV Decadron.  Have discussed with the patient and available family all diagnostics and treatments performed thus far and all questions were answered to the best of my ability. The patient demonstrates understanding and agreement with plan.    .Critical Care Performed by: Merlyn Lot, MD Authorized by: Merlyn Lot, MD   Critical care provider statement:    Critical care time (minutes):  35   Critical care time was exclusive of:  Separately billable procedures and treating other patients   Critical care was necessary to treat or prevent imminent or life-threatening deterioration of the following conditions:  CNS failure or compromise   Critical care was time spent personally by me on the following activities:  Development of treatment plan with patient or surrogate, discussions with consultants, evaluation of patient's response to treatment, examination of patient, obtaining history from patient or surrogate, ordering and performing treatments and interventions, ordering and review of laboratory studies, ordering and review of radiographic studies, pulse oximetry, re-evaluation of patient's condition and review of old charts      Merlyn Lot, MD 11/06/19 [phone]

## 2019-11-05 NOTE — ED Notes (Signed)
Upon entering room, pt is resting in bed and appears comfortable. Pt responds to questions by shaking head and is non verbal. Respirations are equal and unlabored, no signs of acute distress.
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
## 2019-11-05 NOTE — ED Notes (Signed)
Pt transported to MRI 

## 2019-11-05 NOTE — ED Notes (Signed)
This RN at bedside with pt and pt's son. Pt able began mumbling but still incomprehensible.

## 2019-11-05 NOTE — Progress Notes (Signed)
Patient was scheduled this afternoon for a virtual visit. Son called the clinic and reported that patient had AMS. She was referred to the ER for evaluation.

## 2019-11-06 LAB — SARS CORONAVIRUS 2 (TAT 6-24 HRS): SARS Coronavirus 2: NEGATIVE

## 2019-11-06 MED ORDER — LIDOCAINE HCL 1 % IJ SOLN
0.50 | INTRAMUSCULAR | Status: DC
Start: ? — End: 2019-11-06

## 2019-11-06 MED ORDER — DEXAMETHASONE SODIUM PHOSPHATE 4 MG/ML IJ SOLN
4.00 | INTRAMUSCULAR | Status: DC
Start: 2019-11-06 — End: 2019-11-06

## 2019-11-06 MED ORDER — LEVETIRACETAM 500 MG PO TABS
750.00 | ORAL_TABLET | ORAL | Status: DC
Start: 2019-11-11 — End: 2019-11-06

## 2019-11-06 MED ORDER — PANTOPRAZOLE SODIUM 40 MG PO TBEC
40.00 | DELAYED_RELEASE_TABLET | ORAL | Status: DC
Start: 2019-11-11 — End: 2019-11-06

## 2019-11-06 MED ORDER — GENERIC EXTERNAL MEDICATION
40.00 | Status: DC
Start: 2019-11-07 — End: 2019-11-06

## 2019-11-06 MED ORDER — MEMANTINE HCL 5 MG PO TABS
10.00 | ORAL_TABLET | ORAL | Status: DC
Start: 2019-11-11 — End: 2019-11-06

## 2019-11-06 MED ORDER — DULOXETINE HCL 30 MG PO CPEP
30.00 | ORAL_CAPSULE | ORAL | Status: DC
Start: 2019-11-12 — End: 2019-11-06

## 2019-11-06 MED ORDER — ONDANSETRON HCL 4 MG/2ML IJ SOLN
4.00 | INTRAMUSCULAR | Status: DC
Start: ? — End: 2019-11-06

## 2019-11-06 MED ORDER — GENERIC EXTERNAL MEDICATION
125.00 | Status: DC
Start: 2019-11-12 — End: 2019-11-06

## 2019-11-06 MED ORDER — METOPROLOL TARTRATE 5 MG/5ML IV SOLN
2.50 | INTRAVENOUS | Status: DC
Start: 2019-11-06 — End: 2019-11-06

## 2019-11-06 NOTE — ED Notes (Signed)
Pt is resting in bed and appears comfortable. Respirations are equal and unlabored, no signs of acute distress. 

## 2019-11-06 NOTE — ED Notes (Signed)
Medical Necessity and EMTALA documentation reviewed at this time and found to be complete per policy. Electronic signature for consent to transfer unable to be obtained d/t pt status (per Dr Ruffin Frederick ED notes, family was aware of plan to transfer to another facility and gave verbal consent to do so).

## 2019-11-06 NOTE — ED Notes (Signed)
Pt unable to sign consent for transfer to [facility], verbal consent received from pt and pt family per MD Quentin Cornwall.

## 2019-11-09 MED ORDER — OXYCODONE HCL 5 MG PO TABS
5.00 | ORAL_TABLET | ORAL | Status: DC
Start: ? — End: 2019-11-09

## 2019-11-09 MED ORDER — DEXAMETHASONE 4 MG PO TABS
4.00 | ORAL_TABLET | ORAL | Status: DC
Start: 2019-11-09 — End: 2019-11-09

## 2019-11-09 MED ORDER — METOPROLOL SUCCINATE ER 25 MG PO TB24
25.00 | ORAL_TABLET | ORAL | Status: DC
Start: 2019-11-12 — End: 2019-11-09

## 2019-11-11 MED ORDER — DEXAMETHASONE 4 MG PO TABS
4.00 | ORAL_TABLET | ORAL | Status: DC
Start: 2019-11-11 — End: 2019-11-11

## 2019-11-11 MED ORDER — ACETAMINOPHEN 325 MG PO TABS
975.00 | ORAL_TABLET | ORAL | Status: DC
Start: 2019-11-11 — End: 2019-11-11

## 2019-11-19 ENCOUNTER — Ambulatory Visit: Payer: PRIVATE HEALTH INSURANCE | Attending: Oncology

## 2019-11-19 NOTE — Progress Notes (Signed)
Pharmacist Chemotherapy Monitoring - Follow Up Assessment    I verify that I have reviewed each item in the below checklist:  . Regimen for the patient is scheduled for the appropriate day and plan matches scheduled date. Marland Kitchen Appropriate non-routine labs are ordered dependent on drug ordered. . If applicable, additional medications reviewed and ordered per protocol based on lifetime cumulative doses and/or treatment regimen.   Plan for follow-up and/or issues identified: No . I-vent associated with next due treatment: No . MD and/or nursing notified: No  Julie Irwin K 11/19/2019 9:09 AM

## 2019-11-26 ENCOUNTER — Inpatient Hospital Stay: Payer: PRIVATE HEALTH INSURANCE

## 2019-11-26 ENCOUNTER — Inpatient Hospital Stay
Payer: PRIVATE HEALTH INSURANCE | Attending: Hospice and Palliative Medicine | Admitting: Hospice and Palliative Medicine

## 2019-11-26 ENCOUNTER — Ambulatory Visit: Payer: Self-pay | Admitting: Oncology

## 2019-11-26 DIAGNOSIS — C50919 Malignant neoplasm of unspecified site of unspecified female breast: Secondary | ICD-10-CM

## 2019-11-26 DIAGNOSIS — Z515 Encounter for palliative care: Secondary | ICD-10-CM | POA: Diagnosis not present

## 2019-11-26 NOTE — Progress Notes (Signed)
Virtual Visit via Telephone Note  I connected with family for Julie Irwin on 11/26/19 at 10:30 AM EDT by telephone and verified that I am speaking with the correct person using two identifiers.   I discussed the limitations, risks, security and privacy concerns of performing an evaluation and management service by telephone and the availability of in person appointments. I also discussed with the patient that there may be a patient responsible charge related to this service. The patient expressed understanding and agreed to proceed.   History of Present Illness: Ms. Julie Irwin is a 64 y.o. 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    Patient presented to [facility] ED on 11/05/2019 with altered mental status.  MRI of the brain revealed substantially progressed left parietal brain mass and left hemisphere edema with new mass-effect.  Patient was transferred to [facility] for neurosurgical evaluation.  She was ultimately discharged home with hospice.    Observations/Objective: Patient was unable to complete virtual visit scheduled today for post hospital follow-up.  I called and spoke with patient's son who reports that she has generally been declining and is now mostly bedbound and totally dependent upon care from family/hospice.  He confirmed that the goal is to keep patient home and comfortable.  I readdressed CODE STATUS with him.  He says that he needs to speak with his family prior to any decision making.  I asked that he follow-up with hospice and encouraged DNR/DNI to facilitate patient's goal of comfort and end-of-life care at home.    Note the patient is being followed by [facility] hospice.  Assessment and Plan: Stage IV breast cancer -widely metastatic now under hospice care at home  ACP -recommend DNR/DNI to be facilitated by  hospice  Follow Up Instructions: As needed   I discussed the assessment and treatment plan with the patient's family. The patient's family was provided an opportunity to ask questions and all were answered. The patient agreed with the plan and demonstrated an understanding of the instructions.   The patient's family were advised to call back or seek an in-person evaluation if the symptoms worsen or if the condition fails to improve as anticipated.  I provided 10 minutes of non-face-to-face time during this encounter.   Irean Hong, NP

## 2019-11-30 ENCOUNTER — Telehealth: Payer: Self-pay | Admitting: Adult Health Nurse Practitioner

## 2019-11-30 NOTE — Telephone Encounter (Signed)
Called to schedule visit.  He was unable to schedule at this time but would like a call back later this week around lunch time.  Will call back on Thurs or Fri. Amy K. Olena Heckle NP

## 2019-12-04 ENCOUNTER — Other Ambulatory Visit: Payer: Self-pay

## 2019-12-04 ENCOUNTER — Encounter: Payer: Self-pay | Admitting: Emergency Medicine

## 2019-12-04 ENCOUNTER — Emergency Department
Admission: EM | Admit: 2019-12-04 | Discharge: 2019-12-04 | Disposition: A | Discharge status: Hospice | Payer: PRIVATE HEALTH INSURANCE | Attending: Emergency Medicine | Admitting: Emergency Medicine

## 2019-12-04 DIAGNOSIS — C50411 Malignant neoplasm of upper-outer quadrant of right female breast: Secondary | ICD-10-CM | POA: Diagnosis present

## 2019-12-04 DIAGNOSIS — I10 Essential (primary) hypertension: Secondary | ICD-10-CM | POA: Diagnosis present

## 2019-12-04 DIAGNOSIS — F329 Major depressive disorder, single episode, unspecified: Secondary | ICD-10-CM | POA: Diagnosis present

## 2019-12-04 DIAGNOSIS — F32A Depression, unspecified: Secondary | ICD-10-CM | POA: Diagnosis present

## 2019-12-04 DIAGNOSIS — Z923 Personal history of irradiation: Secondary | ICD-10-CM | POA: Insufficient documentation

## 2019-12-04 DIAGNOSIS — E039 Hypothyroidism, unspecified: Secondary | ICD-10-CM | POA: Diagnosis not present

## 2019-12-04 DIAGNOSIS — G40901 Epilepsy, unspecified, not intractable, with status epilepticus: Secondary | ICD-10-CM | POA: Diagnosis not present

## 2019-12-04 DIAGNOSIS — Z85841 Personal history of malignant neoplasm of brain: Secondary | ICD-10-CM | POA: Insufficient documentation

## 2019-12-04 DIAGNOSIS — Z515 Encounter for palliative care: Secondary | ICD-10-CM | POA: Diagnosis not present

## 2019-12-04 DIAGNOSIS — Z9221 Personal history of antineoplastic chemotherapy: Secondary | ICD-10-CM | POA: Diagnosis not present

## 2019-12-04 DIAGNOSIS — Z20822 Contact with and (suspected) exposure to covid-19: Secondary | ICD-10-CM | POA: Insufficient documentation

## 2019-12-04 DIAGNOSIS — C7931 Secondary malignant neoplasm of brain: Secondary | ICD-10-CM | POA: Diagnosis present

## 2019-12-04 DIAGNOSIS — Z853 Personal history of malignant neoplasm of breast: Secondary | ICD-10-CM | POA: Insufficient documentation

## 2019-12-04 DIAGNOSIS — R569 Unspecified convulsions: Secondary | ICD-10-CM | POA: Diagnosis not present

## 2019-12-04 LAB — CBC WITH DIFFERENTIAL/PLATELET
Abs Immature Granulocytes: 0.41 10*3/uL — ABNORMAL HIGH (ref 0.00–0.07)
Basophils Absolute: 0 10*3/uL (ref 0.0–0.1)
Basophils Relative: 0 %
Eosinophils Absolute: 0 10*3/uL (ref 0.0–0.5)
Eosinophils Relative: 0 %
HCT: 46.9 % — ABNORMAL HIGH (ref 36.0–46.0)
Hemoglobin: 15.8 g/dL — ABNORMAL HIGH (ref 12.0–15.0)
Immature Granulocytes: 5 %
Lymphocytes Relative: 4 %
Lymphs Abs: 0.3 10*3/uL — ABNORMAL LOW (ref 0.7–4.0)
MCH: 32.3 pg (ref 26.0–34.0)
MCHC: 33.7 g/dL (ref 30.0–36.0)
MCV: 95.9 fL (ref 80.0–100.0)
Monocytes Absolute: 0.4 10*3/uL (ref 0.1–1.0)
Monocytes Relative: 5 %
Neutro Abs: 6.9 10*3/uL (ref 1.7–7.7)
Neutrophils Relative %: 86 %
Platelets: 60 10*3/uL — ABNORMAL LOW (ref 150–400)
RBC: 4.89 MIL/uL (ref 3.87–5.11)
RDW: 13.2 % (ref 11.5–15.5)
Smear Review: DECREASED
WBC: 8.1 10*3/uL (ref 4.0–10.5)
nRBC: 0 % (ref 0.0–0.2)

## 2019-12-04 LAB — COMPREHENSIVE METABOLIC PANEL
ALT: 69 U/L — ABNORMAL HIGH (ref 0–44)
AST: 42 U/L — ABNORMAL HIGH (ref 15–41)
Albumin: 2.7 g/dL — ABNORMAL LOW (ref 3.5–5.0)
Alkaline Phosphatase: 105 U/L (ref 38–126)
Anion gap: 6 (ref 5–15)
BUN: 24 mg/dL — ABNORMAL HIGH (ref 8–23)
CO2: 24 mmol/L (ref 22–32)
Calcium: 8.2 mg/dL — ABNORMAL LOW (ref 8.9–10.3)
Chloride: 108 mmol/L (ref 98–111)
Creatinine, Ser: 0.44 mg/dL (ref 0.44–1.00)
GFR calc Af Amer: >60 mL/min (ref >60)
GFR calc non Af Amer: >60 mL/min (ref >60)
Glucose, Bld: 86 mg/dL (ref 70–99)
Potassium: 3.8 mmol/L (ref 3.5–5.1)
Sodium: 138 mmol/L (ref 135–145)
Total Bilirubin: 1.6 mg/dL — ABNORMAL HIGH (ref 0.3–1.2)
Total Protein: 5.2 g/dL — ABNORMAL LOW (ref 6.5–8.1)

## 2019-12-04 LAB — RESPIRATORY PANEL BY RT PCR (FLU A&B, COVID)
Influenza A by PCR: NEGATIVE
Influenza B by PCR: NEGATIVE
SARS Coronavirus 2 by RT PCR: NEGATIVE

## 2019-12-04 MED ORDER — LORAZEPAM 2 MG/ML IJ SOLN
INTRAMUSCULAR | Status: AC
  Filled 2019-12-04: qty 1

## 2019-12-04 MED ORDER — LORAZEPAM 2 MG/ML IJ SOLN
1.0000 mg | INTRAMUSCULAR | Status: DC | PRN
  Administered 2019-12-04: 16:00:00 1 mg via INTRAVENOUS
  Filled 2019-12-04: qty 1

## 2019-12-04 MED ORDER — LEVETIRACETAM IN NACL 1500 MG/100ML IV SOLN
1500.0000 mg | Freq: Once | INTRAVENOUS | Status: AC
  Administered 2019-12-04: 15:00:00 1500 mg via INTRAVENOUS
  Filled 2019-12-04: qty 100

## 2019-12-04 MED ORDER — LORAZEPAM 2 MG/ML IJ SOLN
2.0000 mg | Freq: Once | INTRAMUSCULAR | Status: AC
  Administered 2019-12-04: 15:00:00 2 mg via INTRAVENOUS

## 2019-12-04 NOTE — Progress Notes (Addendum)
   12/04/19 1430  Clinical Encounter Type  Visited With Patient and family together  Visit Type Initial;Spiritual support;Critical Care  Referral From Nurse  Consult/Referral To [facility] (For Healthcare)  Does Patient Have a Medical Advance Directive? Unable to assess, patient is non-responsive or altered mental status  Chaplain visited with family as a response to an end of life OR. When chaplain arrived a palliative care nurse was in room talking with the family and chaplain waited on the outside. Palliative care nurse said that the future daughter-in-law requested a chaplain to pray for patient. Chaplain entered the room, introducing herself. Chaplain talked with family for a few minutes and than asked if she could pray and the family said yes. Chaplain prayed for a peaceful and painless transition for patient. She also asked God to comfort the family and give them peace. Chaplain stayed with family until they left to go to the hospice house.

## 2019-12-04 NOTE — ED Notes (Addendum)
Son and his wife and pt's husband in room. Family provided tissues and water. Family educated on monitoring devices and medications/interventions performed.

## 2019-12-04 NOTE — Progress Notes (Signed)
[facility] ED Room 30 -- Manufacturing engineer [facility]) Hospital Liaison RN note for Hospice Home  Notified by Billey Chang, NP of family request for Hospice Home for end of life care. Met with patient's husband, Jenny Reichmann, patient's son Lovena Le and Taylor's fiancee at bedside. Chaplain Cora also at bedside.  Explained hospice philosophy and services and answered questions. Family is in agreement to transfer patient to the [facility] today once consents are complete.  Pt currently enrolled with [facility] hospice. Spoke with Amy at [facility] who states they will discharge patient effective today so patient can enroll with [facility] and transfer to the Hospice Home.   Signed and completed DNR and AVS given to [facility] for patient transport.  RN please call report to [phone].  EMS called and requested pickup for 8 pm.  Please call with any hospice related questions or concerns.  Thank you. Margaretmary Eddy, BSN, RN [facility] Liaison [phone]

## 2019-12-04 NOTE — ED Triage Notes (Signed)
Pt arrived EMS for 1.5 hr long seizure. Pt is full code hospice pt.  Pt still seizing on arrival. Pt was given 2 mg ativan by EMS and became almost apneic after with RR 4-6.  BVM assisting on arrival.  Dr Jimmye Norman in room. CA pt

## 2019-12-04 NOTE — ED Notes (Signed)
Per MD- family states pt is DNR/DNI- pt placed on 15 liter's NR.
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
## 2019-12-04 NOTE — ED Provider Notes (Signed)
Patient has been seen at bedside by pedal care providers.  After discussion with family the plan will be to take patient directly to hospice.    Merlyn Lot, MD 12/04/19 [phone]

## 2019-12-04 NOTE — ED Notes (Signed)
[facility] present to transport pt to [facility]. Admitting nurse at [facility] notified, [phone]. Pt has 20G PIV intact to L wrist. Son/POA notified of transfer.

## 2019-12-04 NOTE — ED Notes (Signed)
Pt to be transferred to hospice home- charge RN notified.

## 2019-12-04 NOTE — ED Notes (Signed)
Pt lying in bed asleep (eyes closed with equal unlabored RR)
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
## 2019-12-04 NOTE — ED Notes (Signed)
MD at bedside. 

## 2019-12-04 NOTE — ED Notes (Signed)
Pt cleaned, new chux and hospital gown applied, pillow applied, external catheter and clean brief applied. Pt's respirations are shallow and bradypneic.

## 2019-12-31 ENCOUNTER — Ambulatory Visit: Payer: Self-pay | Admitting: Urology

## 2020-01-06 ENCOUNTER — Other Ambulatory Visit: Payer: Self-pay | Admitting: Oncology

## 2020-01-12 DEATH — deceased
